# Patient Record
Sex: Female | Born: 1960 | Race: Black or African American | Hispanic: No | Marital: Single | Smoking: Never smoker
Health system: Southern US, Community
[De-identification: ages and names within clinical notes are randomized; demographics above are authoritative.]

## PROBLEM LIST (undated history)

## (undated) DIAGNOSIS — Z8669 Personal history of other diseases of the nervous system and sense organs: Secondary | ICD-10-CM

## (undated) DIAGNOSIS — T7840XA Allergy, unspecified, initial encounter: Secondary | ICD-10-CM

## (undated) DIAGNOSIS — E669 Obesity, unspecified: Secondary | ICD-10-CM

## (undated) DIAGNOSIS — E785 Hyperlipidemia, unspecified: Secondary | ICD-10-CM

## (undated) DIAGNOSIS — M2550 Pain in unspecified joint: Secondary | ICD-10-CM

## (undated) DIAGNOSIS — I10 Essential (primary) hypertension: Secondary | ICD-10-CM

## (undated) DIAGNOSIS — E7800 Pure hypercholesterolemia, unspecified: Secondary | ICD-10-CM

## (undated) DIAGNOSIS — I2510 Atherosclerotic heart disease of native coronary artery without angina pectoris: Secondary | ICD-10-CM

## (undated) HISTORY — DX: Obesity, unspecified: E66.9

## (undated) HISTORY — DX: Atherosclerotic heart disease of native coronary artery without angina pectoris: I25.10

## (undated) HISTORY — DX: Pure hypercholesterolemia, unspecified: E78.00

## (undated) HISTORY — DX: Hyperlipidemia, unspecified: E78.5

## (undated) HISTORY — PX: TEAR DUCT PROBING: SHX793

## (undated) HISTORY — DX: Pain in unspecified joint: M25.50

## (undated) HISTORY — DX: Personal history of other diseases of the nervous system and sense organs: Z86.69

## (undated) HISTORY — DX: Allergy, unspecified, initial encounter: T78.40XA

## (undated) HISTORY — DX: Essential (primary) hypertension: I10

---

## 2001-12-17 ENCOUNTER — Encounter: Admission: RE | Admit: 2001-12-17 | Discharge: 2002-03-17 | Payer: Self-pay | Admitting: Family Medicine

## 2002-04-02 ENCOUNTER — Encounter: Admission: RE | Admit: 2002-04-02 | Discharge: 2002-07-01 | Payer: Self-pay | Admitting: Family Medicine

## 2002-07-07 ENCOUNTER — Encounter: Admission: RE | Admit: 2002-07-07 | Discharge: 2002-10-05 | Payer: Self-pay | Admitting: Family Medicine

## 2002-10-14 ENCOUNTER — Encounter: Admission: RE | Admit: 2002-10-14 | Discharge: 2003-01-12 | Payer: Self-pay | Admitting: Family Medicine

## 2003-01-27 ENCOUNTER — Encounter: Admission: RE | Admit: 2003-01-27 | Discharge: 2003-04-27 | Payer: Self-pay | Admitting: Family Medicine

## 2003-05-11 ENCOUNTER — Encounter: Admission: RE | Admit: 2003-05-11 | Discharge: 2003-08-09 | Payer: Self-pay | Admitting: Family Medicine

## 2003-10-04 ENCOUNTER — Encounter: Admission: RE | Admit: 2003-10-04 | Discharge: 2004-01-02 | Payer: Self-pay | Admitting: Family Medicine

## 2004-02-03 ENCOUNTER — Emergency Department (HOSPITAL_COMMUNITY): Admission: EM | Admit: 2004-02-03 | Discharge: 2004-02-03 | Payer: Self-pay | Admitting: Family Medicine

## 2004-03-28 ENCOUNTER — Encounter: Admission: RE | Admit: 2004-03-28 | Discharge: 2004-03-29 | Payer: Self-pay | Admitting: Family Medicine

## 2005-10-22 ENCOUNTER — Ambulatory Visit: Payer: Self-pay | Admitting: Family Medicine

## 2006-10-24 ENCOUNTER — Ambulatory Visit: Payer: Self-pay | Admitting: Family Medicine

## 2006-12-03 ENCOUNTER — Other Ambulatory Visit: Admission: RE | Admit: 2006-12-03 | Discharge: 2006-12-03 | Payer: Self-pay | Admitting: Family Medicine

## 2006-12-03 ENCOUNTER — Ambulatory Visit: Payer: Self-pay | Admitting: Family Medicine

## 2008-02-09 ENCOUNTER — Ambulatory Visit: Payer: Self-pay | Admitting: Family Medicine

## 2008-02-25 ENCOUNTER — Ambulatory Visit: Payer: Self-pay | Admitting: Family Medicine

## 2008-03-15 ENCOUNTER — Encounter: Admission: RE | Admit: 2008-03-15 | Discharge: 2008-03-30 | Payer: Self-pay | Admitting: Family Medicine

## 2008-03-24 ENCOUNTER — Ambulatory Visit: Payer: Self-pay | Admitting: Family Medicine

## 2008-06-07 ENCOUNTER — Ambulatory Visit: Payer: Self-pay | Admitting: Family Medicine

## 2008-10-04 ENCOUNTER — Ambulatory Visit: Payer: Self-pay | Admitting: Family Medicine

## 2009-02-04 ENCOUNTER — Ambulatory Visit: Payer: Self-pay | Admitting: Family Medicine

## 2009-05-31 ENCOUNTER — Ambulatory Visit: Payer: Self-pay | Admitting: Family Medicine

## 2009-10-20 ENCOUNTER — Ambulatory Visit: Payer: Self-pay | Admitting: Family Medicine

## 2009-12-31 ENCOUNTER — Emergency Department (HOSPITAL_COMMUNITY): Admission: EM | Admit: 2009-12-31 | Discharge: 2009-12-31 | Payer: Self-pay | Admitting: Family Medicine

## 2010-04-27 ENCOUNTER — Ambulatory Visit: Payer: Self-pay | Admitting: Family Medicine

## 2010-08-30 ENCOUNTER — Encounter: Payer: Self-pay | Admitting: Family Medicine

## 2010-09-28 ENCOUNTER — Ambulatory Visit (INDEPENDENT_AMBULATORY_CARE_PROVIDER_SITE_OTHER): Payer: 59 | Admitting: Family Medicine

## 2010-09-28 ENCOUNTER — Encounter: Payer: Self-pay | Admitting: Family Medicine

## 2010-09-28 DIAGNOSIS — E1169 Type 2 diabetes mellitus with other specified complication: Secondary | ICD-10-CM

## 2010-09-28 DIAGNOSIS — E785 Hyperlipidemia, unspecified: Secondary | ICD-10-CM

## 2010-09-28 DIAGNOSIS — I10 Essential (primary) hypertension: Secondary | ICD-10-CM

## 2010-09-28 DIAGNOSIS — E1159 Type 2 diabetes mellitus with other circulatory complications: Secondary | ICD-10-CM | POA: Insufficient documentation

## 2010-09-28 DIAGNOSIS — E119 Type 2 diabetes mellitus without complications: Secondary | ICD-10-CM

## 2010-09-28 DIAGNOSIS — I152 Hypertension secondary to endocrine disorders: Secondary | ICD-10-CM | POA: Insufficient documentation

## 2010-09-28 DIAGNOSIS — J309 Allergic rhinitis, unspecified: Secondary | ICD-10-CM

## 2010-09-28 DIAGNOSIS — E118 Type 2 diabetes mellitus with unspecified complications: Secondary | ICD-10-CM | POA: Insufficient documentation

## 2010-09-28 DIAGNOSIS — Z8249 Family history of ischemic heart disease and other diseases of the circulatory system: Secondary | ICD-10-CM

## 2010-09-28 DIAGNOSIS — Z8669 Personal history of other diseases of the nervous system and sense organs: Secondary | ICD-10-CM

## 2010-09-28 LAB — POCT GLYCOSYLATED HEMOGLOBIN (HGB A1C): Hemoglobin A1C: 6.2

## 2010-09-28 NOTE — Patient Instructions (Signed)
Start back using a food diary. Pay attention to every meal in terms of quality of the meal. fatss protein and carbohydrates.  get at least 20 minutes of exercise every day. You can also spread out throughout the day and take 5 or 10 minutes periods of exercise

## 2010-09-28 NOTE — Progress Notes (Signed)
  Subjective:    Patient ID: Alicia Lowery, female    DOB: Jun 24, 1960, 50 y.o.   MRN: [id number]  HPI she is here for a diabetes recheck. Her weight is up 10 pounds. Per discussion with her indicates she eats what her brother is cooking. Exercise pattern is less than 20 minutes per day. she does check her feet periodically. Continues on medications listed in the chart he does not smoke or drink.   Review of Systems Negative except as above    Objective:   Physical Exam work and in no distress otherwise not examined. Glowed A1c 6.2        Assessment & Plan:  see problem list I again had a long discussion with her concerning her lifestyle habits in regard to diet and exercise. Strongly suggested that she make a conscious effort to evaluate all food she eats as well as increase her physical activities. She'll continue on her present location regimen.

## 2010-11-24 ENCOUNTER — Other Ambulatory Visit: Payer: Self-pay | Admitting: Family Medicine

## 2011-01-29 ENCOUNTER — Ambulatory Visit: Payer: 59 | Admitting: Family Medicine

## 2011-07-10 ENCOUNTER — Other Ambulatory Visit: Payer: Self-pay | Admitting: Family Medicine

## 2011-08-25 ENCOUNTER — Other Ambulatory Visit: Payer: Self-pay | Admitting: Family Medicine

## 2012-01-29 ENCOUNTER — Other Ambulatory Visit: Payer: Self-pay | Admitting: Family Medicine

## 2012-01-30 ENCOUNTER — Other Ambulatory Visit: Payer: Self-pay

## 2012-01-30 MED ORDER — LISINOPRIL-HYDROCHLOROTHIAZIDE 20-12.5 MG PO TABS: 1.0000 | ORAL_TABLET | Freq: Every day | ORAL | Status: DC

## 2012-01-30 NOTE — Telephone Encounter (Signed)
Got request for 90 days due to ins.

## 2012-05-22 ENCOUNTER — Other Ambulatory Visit: Payer: Self-pay | Admitting: Family Medicine

## 2012-05-30 ENCOUNTER — Telehealth: Payer: Self-pay | Admitting: Family Medicine

## 2012-05-30 MED ORDER — LISINOPRIL-HYDROCHLOROTHIAZIDE 20-12.5 MG PO TABS: 1.0000 | ORAL_TABLET | Freq: Every day | ORAL | Status: DC

## 2012-05-30 NOTE — Telephone Encounter (Signed)
SENT B/P MED IN BECAUSE PT HAS APT

## 2012-06-06 ENCOUNTER — Ambulatory Visit: Payer: 59 | Admitting: Family Medicine

## 2012-06-10 ENCOUNTER — Encounter: Payer: Self-pay | Admitting: Gastroenterology

## 2012-06-10 ENCOUNTER — Encounter: Payer: Self-pay | Admitting: Family Medicine

## 2012-06-10 ENCOUNTER — Ambulatory Visit (INDEPENDENT_AMBULATORY_CARE_PROVIDER_SITE_OTHER): Payer: 59 | Admitting: Family Medicine

## 2012-06-10 VITALS — BP 130/80 | HR 80 | Ht 62.5 in | Wt 249.0 lb

## 2012-06-10 DIAGNOSIS — E1159 Type 2 diabetes mellitus with other circulatory complications: Secondary | ICD-10-CM

## 2012-06-10 DIAGNOSIS — Z79899 Other long term (current) drug therapy: Secondary | ICD-10-CM

## 2012-06-10 DIAGNOSIS — E119 Type 2 diabetes mellitus without complications: Secondary | ICD-10-CM

## 2012-06-10 DIAGNOSIS — E1169 Type 2 diabetes mellitus with other specified complication: Secondary | ICD-10-CM

## 2012-06-10 DIAGNOSIS — Z8249 Family history of ischemic heart disease and other diseases of the circulatory system: Secondary | ICD-10-CM

## 2012-06-10 DIAGNOSIS — E785 Hyperlipidemia, unspecified: Secondary | ICD-10-CM

## 2012-06-10 DIAGNOSIS — Z8669 Personal history of other diseases of the nervous system and sense organs: Secondary | ICD-10-CM

## 2012-06-10 DIAGNOSIS — J309 Allergic rhinitis, unspecified: Secondary | ICD-10-CM

## 2012-06-10 DIAGNOSIS — I152 Hypertension secondary to endocrine disorders: Secondary | ICD-10-CM

## 2012-06-10 DIAGNOSIS — I10 Essential (primary) hypertension: Secondary | ICD-10-CM

## 2012-06-10 LAB — CBC WITH DIFFERENTIAL/PLATELET
Basophils Absolute: 0 10*3/uL (ref 0.0–0.1)
Basophils Relative: 1 % (ref 0–1)
Eosinophils Absolute: 0.2 10*3/uL (ref 0.0–0.7)
Eosinophils Relative: 5 % (ref 0–5)
Lymphocytes Relative: 46 % (ref 12–46)
MCV: 88.3 fL (ref 78.0–100.0)
Platelets: 274 10*3/uL (ref 150–400)
RDW: 14.1 % (ref 11.5–15.5)
WBC: 4.4 10*3/uL (ref 4.0–10.5)

## 2012-06-10 LAB — POCT URINALYSIS DIPSTICK
Bilirubin, UA: NEGATIVE
Blood, UA: NEGATIVE
Nitrite, UA: NEGATIVE
Urobilinogen, UA: NEGATIVE
pH, UA: 7

## 2012-06-10 LAB — POCT GLYCOSYLATED HEMOGLOBIN (HGB A1C): Hemoglobin A1C: 6.3

## 2012-06-10 LAB — POCT UA - MICROALBUMIN: Albumin/Creatinine Ratio, Urine, POC: <3.3

## 2012-06-10 MED ORDER — LISINOPRIL-HYDROCHLOROTHIAZIDE 20-12.5 MG PO TABS: 1.0000 | ORAL_TABLET | Freq: Every day | ORAL | Status: DC

## 2012-06-10 NOTE — Progress Notes (Signed)
  Subjective:    Patient ID: Alicia Lowery, female    DOB: May 05, 1961, 52 y.o.   MRN: [id number]  HPI She is here for an interval evaluation. She has not been seen in well over a year. She was surprised that had been that long. Presently she is taking blood pressure medication and does occasionally check her blood sugar. She has not seen a doctor in the last year. Her activity level is quite limited. Her allergies seem to be under good control. She has very little difficulty with migraine headaches. In the past he had been on medicine for her cholesterol but did have difficulties with them. She does have a family history of heart disease but has not seen cardiology. Her last EKG was in 2004. She has had no chest pain, shortness of breath, DOE. She has not had a Pap in several years.   Review of Systems     Objective:   Physical Exam Alert and in no distress. Hemoglobin A1c is 5.7 EKG does show evidence of right bundle branch block.     Assessment & Plan:   1. Diabetes mellitus  POCT glycosylated hemoglobin (Hb A1C), POCT Urinalysis Dipstick, POCT UA - Microalbumin, Lipid panel, CBC with Differential, Comprehensive metabolic panel, Ambulatory referral to Ophthalmology  2. Family history of heart disease in female family member before age 4  EKG 12-Lead, Ambulatory referral to Cardiology  3. History of migraine headaches    4. Hyperlipidemia LDL goal <70  Lipid panel  5. Allergic rhinitis    6. Hypertension associated with diabetes  CBC with Differential, Comprehensive metabolic panel, lisinopril-hydrochlorothiazide (PRINZIDE,ZESTORETIC) 20-12.5 MG per tablet  7. Obesity, morbid  CBC with Differential, Comprehensive metabolic panel, lisinopril-hydrochlorothiazide (PRINZIDE,ZESTORETIC) 20-12.5 MG per tablet  8. Encounter for long-term (current) use of other medications  HM COLONOSCOPY, Flu vaccine greater than or equal to 3yo preservative free IM   recommend she return for complete examination  and Pap. Routine blood studies ordered. I will place her back on all of her medications, flu shot, colonoscopy will be ordered.

## 2012-06-11 LAB — COMPREHENSIVE METABOLIC PANEL
ALT: 19 U/L (ref 0–35)
AST: 17 U/L (ref 0–37)
Alkaline Phosphatase: 53 U/L (ref 39–117)
Chloride: 103 mEq/L (ref 96–112)
Creat: 0.8 mg/dL (ref 0.50–1.10)
Total Bilirubin: 0.8 mg/dL (ref 0.3–1.2)

## 2012-06-11 LAB — LIPID PANEL
HDL: 46 mg/dL (ref >39)
Total CHOL/HDL Ratio: 5.6 Ratio

## 2012-06-11 MED ORDER — ROSUVASTATIN CALCIUM 20 MG PO TABS: 20.0000 mg | ORAL_TABLET | Freq: Every day | ORAL | Status: DC

## 2012-06-12 DIAGNOSIS — Z79899 Other long term (current) drug therapy: Secondary | ICD-10-CM

## 2012-06-12 DIAGNOSIS — Z23 Encounter for immunization: Secondary | ICD-10-CM

## 2012-06-13 NOTE — Progress Notes (Signed)
Quick Note:  CALLED PT # LEFT WORD FOR WORD MESSAGE( Let her know that I called in Crestor. Recheck in 4 months   ______

## 2012-07-10 ENCOUNTER — Ambulatory Visit (AMBULATORY_SURGERY_CENTER): Payer: 59

## 2012-07-10 ENCOUNTER — Encounter: Payer: Self-pay | Admitting: Gastroenterology

## 2012-07-10 VITALS — Ht 62.0 in | Wt 257.0 lb

## 2012-07-10 DIAGNOSIS — Z1211 Encounter for screening for malignant neoplasm of colon: Secondary | ICD-10-CM

## 2012-07-24 ENCOUNTER — Ambulatory Visit (AMBULATORY_SURGERY_CENTER): Payer: 59 | Admitting: Gastroenterology

## 2012-07-24 ENCOUNTER — Encounter: Payer: Self-pay | Admitting: Gastroenterology

## 2012-07-24 VITALS — BP 122/86 | HR 62 | Temp 97.1°F | Resp 21 | Ht 62.0 in | Wt 257.0 lb

## 2012-07-24 DIAGNOSIS — Z1211 Encounter for screening for malignant neoplasm of colon: Secondary | ICD-10-CM

## 2012-07-24 MED ORDER — SODIUM CHLORIDE 0.9 % IV SOLN: 500.0000 mL | INTRAVENOUS | Status: DC

## 2012-07-24 NOTE — Progress Notes (Signed)
Patient did not experience any of the following events: a burn prior to discharge; a fall within the facility; wrong site/side/patient/procedure/implant event; or a hospital transfer or hospital admission upon discharge from the facility. (G8907) Patient did not have preoperative order for IV antibiotic SSI prophylaxis. (G8918)  

## 2012-07-24 NOTE — Op Note (Signed)
[facility] [address] [place], [address]   COLONOSCOPY PROCEDURE REPORT  PATIENT: Alicia, Lowery  MR#: [id number] BIRTHDATE: 28-Jul-1960 , 52  yrs. old GENDER: Female ENDOSCOPIST: Louis Meckel, MD REFERRED XB:JYNW Susann Givens, M.D. PROCEDURE DATE:  07/24/2012 PROCEDURE:   Colonoscopy, diagnostic ASA CLASS:   Class II INDICATIONS:average risk screening. MEDICATIONS: MAC sedation, administered by CRNA and Propofol (Diprivan) 230 mg IV  DESCRIPTION OF PROCEDURE:   After the risks benefits and alternatives of the procedure were thoroughly explained, informed consent was obtained.  A digital rectal exam revealed no abnormalities of the rectum.   The LB [id number]  endoscope was introduced through the anus and advanced to the cecum, which was identified by both the appendix and ileocecal valve. No adverse events experienced.   The quality of the prep was Suprep good  The instrument was then slowly withdrawn as the colon was fully examined.      COLON FINDINGS: A normal appearing cecum, ileocecal valve, and appendiceal orifice were identified.  The ascending, hepatic flexure, transverse, splenic flexure, descending, sigmoid colon and rectum appeared unremarkable.  No polyps or cancers were seen. Retroflexed views revealed internal hemorrhoids and an anal tag.. The time to cecum=2 minutes 49 seconds.  Withdrawal time=8 minutes 02 seconds.  The scope was withdrawn and the procedure completed. COMPLICATIONS: There were no complications.  ENDOSCOPIC IMPRESSION: hemorrhoids  RECOMMENDATIONS: Continue current colorectal screening recommendations for "routine risk" patients with a repeat colonoscopy in 10 years.   eSigned:  Louis Meckel, MD 07/24/2012 11:11 AM   cc:

## 2012-07-24 NOTE — Progress Notes (Signed)
Report to pacu rn, vss, bbs=clear 

## 2012-07-24 NOTE — Patient Instructions (Addendum)

## 2012-07-25 ENCOUNTER — Telehealth: Payer: Self-pay | Admitting: *Deleted

## 2012-07-25 NOTE — Telephone Encounter (Signed)
  Follow up Call-  Call back number 07/24/2012  Post procedure Call Back phone  # [phone]  Permission to leave phone message Yes     Patient questions:  Do you have a fever, pain , or abdominal swelling? no Pain Score  0 *  Have you tolerated food without any problems? yes  Have you been able to return to your normal activities? yes  Do you have any questions about your discharge instructions: Diet   no Medications  no Follow up visit  no  Do you have questions or concerns about your Care? no  Actions: * If pain score is 4 or above: No action needed, pain <4.

## 2012-09-18 ENCOUNTER — Telehealth: Payer: Self-pay | Admitting: Internal Medicine

## 2012-09-18 NOTE — Telephone Encounter (Signed)
Pt is scheduled with [facility] 09/19/12 @ 1.30pm arrive at 1:15pm with Dr. Rollene Rotunda. address is [address]  Phone # [phone]

## 2012-09-19 ENCOUNTER — Institutional Professional Consult (permissible substitution): Payer: 59 | Admitting: Cardiology

## 2012-09-26 ENCOUNTER — Encounter: Payer: Self-pay | Admitting: Internal Medicine

## 2012-10-14 ENCOUNTER — Ambulatory Visit (INDEPENDENT_AMBULATORY_CARE_PROVIDER_SITE_OTHER): Payer: 59 | Admitting: Family Medicine

## 2012-10-14 ENCOUNTER — Encounter: Payer: Self-pay | Admitting: Family Medicine

## 2012-10-14 VITALS — BP 130/80 | HR 81 | Wt 256.0 lb

## 2012-10-14 DIAGNOSIS — I10 Essential (primary) hypertension: Secondary | ICD-10-CM

## 2012-10-14 DIAGNOSIS — E1169 Type 2 diabetes mellitus with other specified complication: Secondary | ICD-10-CM

## 2012-10-14 DIAGNOSIS — E119 Type 2 diabetes mellitus without complications: Secondary | ICD-10-CM

## 2012-10-14 DIAGNOSIS — Z91199 Patient's noncompliance with other medical treatment and regimen due to unspecified reason: Secondary | ICD-10-CM

## 2012-10-14 DIAGNOSIS — E785 Hyperlipidemia, unspecified: Secondary | ICD-10-CM

## 2012-10-14 DIAGNOSIS — I152 Hypertension secondary to endocrine disorders: Secondary | ICD-10-CM

## 2012-10-14 DIAGNOSIS — E1159 Type 2 diabetes mellitus with other circulatory complications: Secondary | ICD-10-CM

## 2012-10-14 DIAGNOSIS — Z8249 Family history of ischemic heart disease and other diseases of the circulatory system: Secondary | ICD-10-CM

## 2012-10-14 DIAGNOSIS — Z79899 Other long term (current) drug therapy: Secondary | ICD-10-CM

## 2012-10-14 DIAGNOSIS — Z9119 Patient's noncompliance with other medical treatment and regimen: Secondary | ICD-10-CM

## 2012-10-14 LAB — LIPID PANEL
Total CHOL/HDL Ratio: 3.4 Ratio
VLDL: 23 mg/dL (ref 0–40)

## 2012-10-14 LAB — COMPREHENSIVE METABOLIC PANEL
ALT: 22 U/L (ref 0–35)
AST: 20 U/L (ref 0–37)
Chloride: 104 mEq/L (ref 96–112)
Creat: 0.97 mg/dL (ref 0.50–1.10)
Total Bilirubin: 0.8 mg/dL (ref 0.3–1.2)

## 2012-10-14 NOTE — Progress Notes (Signed)
  Subjective:    Patient ID: Alicia Lowery, female    DOB: 03-Jan-1961, 52 y.o.   MRN: [id number]  HPI She is here for a diabetes recheck. She continues on medications listed in the chart. She admits to not exercising regularly and has made very few dietary changes. Social and family history were reviewed. She is scheduled to see cardiology for cardiac evaluation due to her family history. She does need lance sets.   Review of Systems     Objective:   Physical Exam Alert and in no distress. Globin A1c is 6.5.       Assessment & Plan:  Diabetes mellitus - Plan: POCT glycosylated hemoglobin (Hb A1C)  Obesity, morbid  Hypertension associated with diabetes  Hyperlipidemia LDL goal <70 - Plan: Lipid panel  Encounter for long-term (current) use of other medications - Plan: Comprehensive metabolic panel, Lipid panel  Personal history of noncompliance with medical treatment, presenting hazards to health  Family history of heart disease in female family member before age 61 Spent over 20 minutes spent discussing her care. Strongly encouraged her to continue to make diet and exercise changes. Discussed the fact that her present lifestyle is slowly killing her.

## 2012-10-15 NOTE — Progress Notes (Signed)
Quick Note:  Called pt to inform her that lipids look great continue present med regimen ______

## 2012-11-03 ENCOUNTER — Other Ambulatory Visit: Payer: Self-pay

## 2012-11-03 MED ORDER — ONETOUCH ULTRASOFT LANCETS MISC: Status: DC

## 2012-11-03 MED ORDER — GLUCOSE BLOOD VI STRP: ORAL_STRIP | Status: DC

## 2012-11-03 NOTE — Telephone Encounter (Signed)
SENT 90 DAYS OF TESTING SUPPLIES IN

## 2012-11-25 ENCOUNTER — Ambulatory Visit (INDEPENDENT_AMBULATORY_CARE_PROVIDER_SITE_OTHER): Payer: 59 | Admitting: Cardiology

## 2012-11-25 ENCOUNTER — Encounter: Payer: Self-pay | Admitting: Cardiology

## 2012-11-25 VITALS — BP 122/89 | HR 72 | Ht 63.0 in | Wt 253.1 lb

## 2012-11-25 DIAGNOSIS — Z8249 Family history of ischemic heart disease and other diseases of the circulatory system: Secondary | ICD-10-CM

## 2012-11-25 DIAGNOSIS — R0600 Dyspnea, unspecified: Secondary | ICD-10-CM

## 2012-11-25 DIAGNOSIS — R9431 Abnormal electrocardiogram [ECG] [EKG]: Secondary | ICD-10-CM

## 2012-11-25 NOTE — Progress Notes (Signed)
HPI The patient presents for evaluation of risk factors as well as a family history of early coronary artery disease. She herself has had no prior cardiac history. She has never had any cardiovascular testing. She doesn't recall being told she has an abnormal EKG. She doesn't usually have any symptoms though occasionally when she has to walk up 5 flights because the elevator is she will get very dyspneic. With the usual activities such as walking 75 yards of her driveway she doesn't have any cardiovascular complaints.  The patient denies any new symptoms such as chest discomfort, neck or arm discomfort. There has been no new shortness of breath, PND or orthopnea. There have been no reported palpitations, presyncope or syncope.  No Known Allergies  Current Outpatient Prescriptions  Medication Sig Dispense Refill  . aspirin 81 MG tablet Take 81 mg by mouth daily.        Marland Kitchen glucose blood (ONE TOUCH ULTRA TEST) test strip TEST DAILY AS DIRECTED  300 each  3  . Lancets (ONETOUCH ULTRASOFT) lancets USE TO TEST DAILY  300 each  3  . lisinopril-hydrochlorothiazide (PRINZIDE,ZESTORETIC) 20-12.5 MG per tablet Take 1 tablet by mouth daily.  90 tablet  0  . rosuvastatin (CRESTOR) 20 MG tablet Take 1 tablet (20 mg total) by mouth daily.  90 tablet  3   No current facility-administered medications for this visit.    Past Medical History  Diagnosis Date  . Obesity   . Hypertension   . Allergy   . Cardiovascular disease     Family History  . Hemorrhoids   . Dyslipidemia   . History of migraine headaches   . Diabetes mellitus     Past Surgical History  Procedure Laterality Date  . Tear duct probing      Reuel Boom    Family History  Problem Relation Age of Onset  . Arthritis Mother   . Hypertension Mother   . Heart disease Father   . Hypertension Father   . Arthritis Sister   . Hypertension Sister   . Arthritis Brother   . COPD Maternal Grandmother   . Arthritis Maternal  Grandmother   . Colon cancer Neg Hx     History   Social History  . Marital Status: Single    Spouse Name: N/A    Number of Children: N/A  . Years of Education: N/A   Occupational History  . Not on file.   Social History Main Topics  . Smoking status: Never Smoker   . Smokeless tobacco: Never Used  . Alcohol Use: No  . Drug Use: No  . Sexually Active: Not Currently   Other Topics Concern  . Not on file   Social History Narrative  . No narrative on file    ROS:  As stated in the HPI and negative for all other systems.  PHYSICAL EXAM BP 122/89  Pulse 72  Ht 5\' 3"  (1.6 m)  Wt 253 lb 1.9 oz (114.814 kg)  BMI 44.85 kg/m2 GENERAL:  Well appearing HEENT:  Pupils equal round and reactive, fundi not visualized, oral mucosa unremarkable NECK:  No jugular venous distention, waveform within normal limits, carotid upstroke brisk and symmetric, no bruits, no thyromegaly LYMPHATICS:  No cervical, inguinal adenopathy LUNGS:  Clear to auscultation bilaterally BACK:  No CVA tenderness CHEST:  Unremarkable HEART:  PMI not displaced or sustained,S1 and S2 within normal limits, no S3, no S4, no clicks, no rubs, slight apical systolic murmur heard best  at the right upper sternal border, no diastolic murmurs ABD:  Flat, positive bowel sounds normal in frequency in pitch, no bruits, no rebound, no guarding, no midline pulsatile mass, no hepatomegaly, no splenomegaly EXT:  2 plus pulses throughout, no edema, no cyanosis no clubbing SKIN:  No rashes no nodules NEURO:  Cranial nerves II through XII grossly intact, motor grossly intact throughout [facility]:  Cognitively intact, oriented to person place and time  EKG:  Sinus rhythm, rate 72, right bundle branch block, left anterior fascicular block 11/25/2012  ASSESSMENT AND PLAN  ABNORMAL EKG:  We discussed a conduction defect that she has. This may be related to hypertension. I'll defer to Carollee Herter, MD Whether he would want to  screen for sarcoid as this is an unlikely but possible etiology any young woman. He can consider getting a chest x-ray. I will assess with an echocardiogram. If this is normal the other workup will be as below and we can follow serial EKGs.  DYSPNEA:  Given this and her risk factors I will bring her back for a POET (Plain Old Exercise Test). This will allow me to screen for obstructive coronary disease, risk stratify and very importantly provide a prescription for exercise.  I do think the EKG will be interpretable despite the baseline abnormality. We sent a long time discussing diet and exercise.  OBESITY:  The patient understands the need to lose weight with diet and exercise. We have discussed specific strategies for this.

## 2012-11-25 NOTE — Patient Instructions (Addendum)
The current medical regimen is effective;  continue present plan and medications.  Your physician has requested that you have an exercise tolerance test. For further information please visit [email or web address] Please also follow instruction sheet, as given.  Your physician has requested that you have an echocardiogram. Echocardiography is a painless test that uses sound waves to create images of your heart. It provides your doctor with information about the size and shape of your heart and how well your heart's chambers and valves are working. This procedure takes approximately one hour. There are no restrictions for this procedure.

## 2012-12-02 ENCOUNTER — Other Ambulatory Visit: Payer: Self-pay | Admitting: Family Medicine

## 2012-12-12 ENCOUNTER — Other Ambulatory Visit (HOSPITAL_COMMUNITY): Payer: 59

## 2012-12-12 ENCOUNTER — Encounter: Payer: Self-pay | Admitting: Nurse Practitioner

## 2012-12-12 ENCOUNTER — Ambulatory Visit (INDEPENDENT_AMBULATORY_CARE_PROVIDER_SITE_OTHER): Payer: 59 | Admitting: Nurse Practitioner

## 2012-12-12 ENCOUNTER — Ambulatory Visit (HOSPITAL_COMMUNITY): Payer: 59 | Attending: Cardiology | Admitting: Radiology

## 2012-12-12 DIAGNOSIS — R9431 Abnormal electrocardiogram [ECG] [EKG]: Secondary | ICD-10-CM

## 2012-12-12 DIAGNOSIS — I10 Essential (primary) hypertension: Secondary | ICD-10-CM | POA: Insufficient documentation

## 2012-12-12 DIAGNOSIS — R0600 Dyspnea, unspecified: Secondary | ICD-10-CM

## 2012-12-12 DIAGNOSIS — R0602 Shortness of breath: Secondary | ICD-10-CM | POA: Insufficient documentation

## 2012-12-12 DIAGNOSIS — R011 Cardiac murmur, unspecified: Secondary | ICD-10-CM | POA: Insufficient documentation

## 2012-12-12 DIAGNOSIS — Z8249 Family history of ischemic heart disease and other diseases of the circulatory system: Secondary | ICD-10-CM

## 2012-12-12 DIAGNOSIS — I4510 Unspecified right bundle-branch block: Secondary | ICD-10-CM | POA: Insufficient documentation

## 2012-12-12 DIAGNOSIS — E119 Type 2 diabetes mellitus without complications: Secondary | ICD-10-CM | POA: Insufficient documentation

## 2012-12-12 DIAGNOSIS — I452 Bifascicular block: Secondary | ICD-10-CM

## 2012-12-12 DIAGNOSIS — E785 Hyperlipidemia, unspecified: Secondary | ICD-10-CM | POA: Insufficient documentation

## 2012-12-12 NOTE — Procedures (Signed)
Exercise Treadmill Test  Pre-Exercise Testing Evaluation Rhythm: normal sinus  Rate: 73     Test  Exercise Tolerance Test Ordering MD: Angelina Sheriff, MD  Interpreting MD: Norma Fredrickson NP  Unique Test No: 1  Treadmill:  1  Indication for ETT: dyspnea  Contraindication to ETT: No   Stress Modality: exercise - treadmill  Cardiac Imaging Performed: non   Protocol: standard Bruce - maximal  Max BP:  131/43  Max MPHR (bpm):  167 85% MPR (bpm):  142  MPHR obtained (bpm):  146 % MPHR obtained:  87%  Reached 85% MPHR (min:sec):  3 min Total Exercise Time (min-sec):  3 minutes  Workload in METS:  4.6 Borg Scale: 17  Reason ETT Terminated:  patient's desire to stop    ST Segment Analysis At Rest: normal ST segments - no evidence of significant ST depression With Exercise: no evidence of significant ST depression  Other Information Arrhythmia:  No Angina during ETT:  absent (0) Quality of ETT:  non-diagnostic  ETT Interpretation:  normal - no evidence of ischemia by ST analysis  Comments: Patient presents today for routine GXT. Has abnormal EKG. Has had shortness of breath. Morbidly obese. Denies chest pain. Has DM, HTN, and HLD.   Today, she exercised on the standard Bruce protocol for a total of 3 minutes. VERY POOR exercise tolerance. Adequate blood pressure response. Clinically with dyspnea and the test was stopped due to fatigue. No chest pain. EKG with T wave changes in AVL. Resting EKG with RBBB, LAFB.   Recommendations: Myoview to further define.  She has very poor exercise tolerance. This test was not felt to be adequate. T wave changes noted with exercise.   Echo results from earlier today are pending.  Patient is agreeable to this plan and will call if any problems develop in the interim.   Rosalio Macadamia, RN, ANP-C [facility] [address] [place], [place]  [address]

## 2012-12-12 NOTE — Progress Notes (Signed)
Echocardiogram performed.  

## 2012-12-23 ENCOUNTER — Ambulatory Visit (HOSPITAL_COMMUNITY): Payer: 59 | Attending: Cardiology | Admitting: Radiology

## 2012-12-23 VITALS — BP 123/64 | Ht 63.0 in | Wt 251.0 lb

## 2012-12-23 DIAGNOSIS — Z8249 Family history of ischemic heart disease and other diseases of the circulatory system: Secondary | ICD-10-CM

## 2012-12-23 DIAGNOSIS — R0600 Dyspnea, unspecified: Secondary | ICD-10-CM

## 2012-12-23 DIAGNOSIS — R9431 Abnormal electrocardiogram [ECG] [EKG]: Secondary | ICD-10-CM | POA: Insufficient documentation

## 2012-12-23 DIAGNOSIS — R0609 Other forms of dyspnea: Secondary | ICD-10-CM | POA: Insufficient documentation

## 2012-12-23 DIAGNOSIS — R0989 Other specified symptoms and signs involving the circulatory and respiratory systems: Secondary | ICD-10-CM | POA: Insufficient documentation

## 2012-12-23 MED ORDER — TECHNETIUM TC 99M SESTAMIBI GENERIC - CARDIOLITE
33.0000 | Freq: Once | INTRAVENOUS | Status: AC | PRN
  Administered 2012-12-23: 33 via INTRAVENOUS

## 2012-12-23 MED ORDER — REGADENOSON 0.4 MG/5ML IV SOLN
0.4000 mg | Freq: Once | INTRAVENOUS | Status: AC
  Administered 2012-12-23: 0.4 mg via INTRAVENOUS

## 2012-12-23 NOTE — Progress Notes (Signed)
[facility] SITE 3 NUCLEAR MED [address] [place], [place] [address] [phone]    Cardiology Nuclear Med Study  Alicia Lowery is a 52 y.o. female     MRN : [id number]     DOB: 03-10-1961  Procedure Date: 12/23/2012  Nuclear Med Background Indication for Stress Test:  Evaluation for Ischemia and Abnormal EKG History:  12/12/12 GXT: very poor exercise tolerence-ECHO: EF: 55-60%  mild MR Cardiac Risk Factors: Family History - CAD, Hypertension, Lipids, NIDDM, Obesity and RBBB  Symptoms:  DOE and SOB   Nuclear Pre-Procedure Caffeine/Decaff Intake:  None > 12 hrs NPO After: 7:30pm   Lungs:  clear O2 Sat: 99% on room air. IV 0.9% NS with Angio Cath:  22g  IV Site: R Antecubital x 1, tolerated well IV Started by:  Irean Hong, RN  Chest Size (in):  44 Cup Size: C  Height: 5\' 3"  (1.6 m)  Weight:  251 lb (113.853 kg)  BMI:  Body mass index is 44.47 kg/(m^2). Tech Comments:  Took prinzide this am    Nuclear Med Study 1 or 2 day study: 2 day  Stress Test Type:  Treadmill/Lexiscan  Reading MD: Cassell Clement, MD  Order Authorizing Provider:  Rollene Rotunda, MD, and Norma Fredrickson, NP  Resting Radionuclide: Technetium 52m Sestamibi  Resting Radionuclide Dose: 33.0 mCi  12/25/12  Stress Radionuclide:  Technetium 35m Sestamibi  Stress Radionuclide Dose: 33.0 mCi   12/23/12          Stress Protocol Rest HR: 60 Stress HR: 109  Rest BP: 123/64 Stress BP: 132/73  Exercise Time (min): n/a METS: n/a   Predicted Max HR: 167 bpm % Max HR: 65.27 bpm Rate Pressure Product: 46962   Dose of Adenosine (mg):  n/a Dose of Lexiscan: 0.4 mg  Dose of Atropine (mg): n/a Dose of Dobutamine: n/a mcg/kg/min (at max HR)  Stress Test Technologist: Milana Na, EMT-P  Nuclear Technologist:  Doyne Keel, CNMT     Rest Procedure:  Myocardial perfusion imaging was performed at rest 45 minutes following the intravenous administration of Technetium 47m Sestamibi. Rest ECG:  NSR-LVH  Stress Procedure:  The patient received IV Lexiscan 0.4 mg over 15-seconds with concurrent low level exercise and then Technetium 45m Sestamibi was injected at 30-seconds while the patient continued walking one more minute. This patient had sob, woozy, and abdominal pain with the Lexiscan injection. Quantitative spect images were obtained after a 45-minute delay. Stress ECG: No significant change from baseline ECG  QPS Raw Data Images:  Normal; no motion artifact; normal heart/lung ratio. Stress Images:  Normal homogeneous uptake in all areas of the myocardium. Rest Images:  Normal homogeneous uptake in all areas of the myocardium. Subtraction (SDS):  No evidence of ischemia. Transient Ischemic Dilatation (Normal <1.22):  n/a Lung/Heart Ratio (Normal <0.45): 0.33  Quantitative Gated Spect Images QGS EDV:  93 ml QGS ESV:  33 ml  Impression Exercise Capacity:  Lexiscan with no exercise. BP Response:  Normal blood pressure response. Clinical Symptoms:  No significant symptoms noted. ECG Impression:  No significant ST segment change suggestive of ischemia. Comparison with Prior Nuclear Study: No images to compare  Overall Impression:  Normal stress nuclear study.  LV Ejection Fraction: 64%.  LV Wall Motion:  NL LV Function; NL Wall Motion.  Vesta Mixer, Montez Hageman., MD, [facility] 12/25/2012, 6:15 PM Office - [phone] Pager [phone]

## 2012-12-25 ENCOUNTER — Ambulatory Visit (HOSPITAL_COMMUNITY): Payer: 59 | Attending: Cardiovascular Disease

## 2012-12-25 DIAGNOSIS — R0989 Other specified symptoms and signs involving the circulatory and respiratory systems: Secondary | ICD-10-CM

## 2012-12-25 MED ORDER — TECHNETIUM TC 99M SESTAMIBI GENERIC - CARDIOLITE
33.0000 | Freq: Once | INTRAVENOUS | Status: AC | PRN
  Administered 2012-12-25: 33 via INTRAVENOUS

## 2013-03-07 ENCOUNTER — Other Ambulatory Visit: Payer: Self-pay | Admitting: Family Medicine

## 2013-03-10 ENCOUNTER — Ambulatory Visit: Payer: 59 | Admitting: Family Medicine

## 2013-03-10 ENCOUNTER — Other Ambulatory Visit (INDEPENDENT_AMBULATORY_CARE_PROVIDER_SITE_OTHER): Payer: 59

## 2013-03-10 DIAGNOSIS — Z23 Encounter for immunization: Secondary | ICD-10-CM

## 2013-04-14 ENCOUNTER — Ambulatory Visit (INDEPENDENT_AMBULATORY_CARE_PROVIDER_SITE_OTHER): Payer: 59 | Admitting: Family Medicine

## 2013-04-14 ENCOUNTER — Encounter: Payer: Self-pay | Admitting: Family Medicine

## 2013-04-14 VITALS — BP 130/80 | HR 70 | Wt 244.0 lb

## 2013-04-14 DIAGNOSIS — E1169 Type 2 diabetes mellitus with other specified complication: Secondary | ICD-10-CM

## 2013-04-14 DIAGNOSIS — Z23 Encounter for immunization: Secondary | ICD-10-CM

## 2013-04-14 DIAGNOSIS — E119 Type 2 diabetes mellitus without complications: Secondary | ICD-10-CM

## 2013-04-14 DIAGNOSIS — I10 Essential (primary) hypertension: Secondary | ICD-10-CM

## 2013-04-14 DIAGNOSIS — J309 Allergic rhinitis, unspecified: Secondary | ICD-10-CM

## 2013-04-14 DIAGNOSIS — I452 Bifascicular block: Secondary | ICD-10-CM

## 2013-04-14 DIAGNOSIS — Z8669 Personal history of other diseases of the nervous system and sense organs: Secondary | ICD-10-CM

## 2013-04-14 DIAGNOSIS — E785 Hyperlipidemia, unspecified: Secondary | ICD-10-CM

## 2013-04-14 DIAGNOSIS — I152 Hypertension secondary to endocrine disorders: Secondary | ICD-10-CM

## 2013-04-14 DIAGNOSIS — E1159 Type 2 diabetes mellitus with other circulatory complications: Secondary | ICD-10-CM

## 2013-04-14 DIAGNOSIS — Z8249 Family history of ischemic heart disease and other diseases of the circulatory system: Secondary | ICD-10-CM

## 2013-04-14 LAB — POCT UA - MICROALBUMIN: Creatinine, POC: 177 mg/dL

## 2013-04-14 NOTE — Progress Notes (Signed)
Subjective:    Alicia Lowery is a 52 y.o. female who presents for follow-up of Type 2 diabetes mellitus. She has no particular concerns today.     Home blood sugar records: fasting range: 85-120  measured every other day  Current symptoms/problems include none and have  been unchanged. Daily foot checks: checking feet everyday   Any foot concerns: none today Last eye exam: last year   Medication compliance: Current diet: well balanced Current exercise: walking about 3 x per week Known diabetic complications: none Cardiovascular risk factors: dyslipidemia, hypertension and obesity (BMI >= 30 kg/m2), recently evaluated by cardiology 11/25/12   The following portions of the patient's history were reviewed and updated as appropriate: allergies, current medications, past family history, past medical history, past social history, past surgical history and problem list.  She has a history of hypertension and her blood pressures have typically been approximately 130/80. Her allergic rhinitis has bothered her minimally and she is able to control her congestion and rhinorrhea with over the counter medication such as Loratidine. She has intentionally  lost 12 pounds since her last visit here. In the past she has had migraines but lately has had no headaches. She has a history of RBBB and LAFB, and recently underwent evaluation by cardiology 11/26/10 for DOE, ultimately having normal nuclear stress test. She reports no chest pain, SOB, DOE, palpitations, or syncope.  ROS as in subjective above    Objective:    BP 130/80  Pulse 70  Wt 244 lb (110.678 kg)  SpO2 98%  Filed Vitals:   04/14/13 0817  BP: 130/80  Pulse: 70    General appearence: alert, no distress, WD/WN Neck: supple, no lymphadenopathy, no thyromegaly, no masses Heart: RRR, normal S1, S2, no murmurs Lungs: CTA bilaterally, no wheezes, rhonchi, or rales Abdomen: +bs, soft, non tender, non distended, no masses, no hepatomegaly,  no splenomegaly Pulses: 2+ symmetric, upper and lower extremities, normal cap refill Ext: 2+ pitting edema bilaterally to ankles Foot exam: no wounds, ulcers, or calluses Neuro: foot [place] touch test normal bilaterally   Lab Review Lab Results  Component Value Date   HGBA1C 6.5 10/14/2012   Lab Results  Component Value Date   CHOL 148 10/14/2012   HDL 44 10/14/2012   LDLCALC 81 10/14/2012   TRIG 116 10/14/2012   CHOLHDL 3.4 10/14/2012   No results found for this basenameConcepcion Elk     Chemistry      Component Value Date/Time   NA 138 10/14/2012 1613   K 3.8 10/14/2012 1613   CL 104 10/14/2012 1613   CO2 25 10/14/2012 1613   BUN 11 10/14/2012 1613   CREATININE 0.97 10/14/2012 1613      Component Value Date/Time   CALCIUM 10.0 10/14/2012 1613   ALKPHOS 57 10/14/2012 1613   AST 20 10/14/2012 1613   ALT 22 10/14/2012 1613   BILITOT 0.8 10/14/2012 1613        Chemistry      Component Value Date/Time   NA 138 10/14/2012 1613   K 3.8 10/14/2012 1613   CL 104 10/14/2012 1613   CO2 25 10/14/2012 1613   BUN 11 10/14/2012 1613   CREATININE 0.97 10/14/2012 1613      Component Value Date/Time   CALCIUM 10.0 10/14/2012 1613   ALKPHOS 57 10/14/2012 1613   AST 20 10/14/2012 1613   ALT 22 10/14/2012 1613   BILITOT 0.8 10/14/2012 1613      Nuclear stress test from  12/23/12 reviewed today. Normal nuclear stress test.  Assessment/Plan:  1. Hypertension associated with diabetes Will continue her lisinopril-HCTZ 20-12.5 mg.   2. Diabetes mellitus Not presently symptomatic. Education: Reviewed 'ABCs' of diabetes management (respective goals in parentheses):  A1C (<7), blood pressure (<130/80), and cholesterol (LDL <100). No need to add anti-diabetic medication at present. - POCT glycosylated hemoglobin (Hb A1C) - POCT UA - Microalbumin  3. Allergic rhinitis Under good control with over the counter medication.  4. Obesity, morbid Commended Ms. Forstrom for her intentional weight loss of 12lbs since last  visit.  5. Hyperlipidemia LDL goal <70 Will continue on her Rosuvastatin 20 mg.  6. History of migraine headaches No recent headaches.  7. 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 Cardiology following.  9. Need for prophylactic vaccination and inoculation against unspecified single disease - Pneumococcal polysaccharide vaccine 23-valent greater than or equal to 2yo subcutaneous/IM

## 2013-04-14 NOTE — Progress Notes (Deleted)
  Subjective:    Alicia Lowery is a 52 y.o. female who presents for follow-up of Type 2 diabetes mellitus.    Home blood sugar records: 120 OR LESS  Current symptoms/problems NONE Daily foot checks: ***   Any foot concerns: *** YES/ NONE Last eye exam:     Medication compliance: Current diet: LOW CARB Current exercise: WALKING, Known diabetic complications: {diabetes complications:1215} Cardiovascular risk factors: {cv risk factors:510}   {Common ambulatory SmartLinks:19316}  ROS as in subjective above    Objective:    There were no vitals taken for this visit.  There were no vitals filed for this visit.  General appearence: alert, no distress, WD/WN Neck: supple, no lymphadenopathy, no thyromegaly, no masses Heart: RRR, normal S1, S2, no murmurs Lungs: CTA bilaterally, no wheezes, rhonchi, or rales Abdomen: +bs, soft, non tender, non distended, no masses, no hepatomegaly, no splenomegaly Pulses: 2+ symmetric, upper and lower extremities, normal cap refill Ext: no edema Foot exam:  Neuro: foot monofilament exam normal   Lab Review Lab Results  Component Value Date   HGBA1C 6.5 10/14/2012   Lab Results  Component Value Date   CHOL 148 10/14/2012   HDL 44 10/14/2012   LDLCALC 81 10/14/2012   TRIG 116 10/14/2012   CHOLHDL 3.4 10/14/2012   No results found for this basenameConcepcion Elk     Chemistry      Component Value Date/Time   NA 138 10/14/2012 1613   K 3.8 10/14/2012 1613   CL 104 10/14/2012 1613   CO2 25 10/14/2012 1613   BUN 11 10/14/2012 1613   CREATININE 0.97 10/14/2012 1613      Component Value Date/Time   CALCIUM 10.0 10/14/2012 1613   ALKPHOS 57 10/14/2012 1613   AST 20 10/14/2012 1613   ALT 22 10/14/2012 1613   BILITOT 0.8 10/14/2012 1613        Chemistry      Component Value Date/Time   NA 138 10/14/2012 1613   K 3.8 10/14/2012 1613   CL 104 10/14/2012 1613   CO2 25 10/14/2012 1613   BUN 11 10/14/2012 1613   CREATININE 0.97 10/14/2012 1613      Component  Value Date/Time   CALCIUM 10.0 10/14/2012 1613   ALKPHOS 57 10/14/2012 1613   AST 20 10/14/2012 1613   ALT 22 10/14/2012 1613   BILITOT 0.8 10/14/2012 1613       Last optometry/ophthalmology exam reviewed from:    Assessment:        Plan:    1.  Rx changes: {none:33079} 2.  Education: Reviewed 'ABCs' of diabetes management (respective goals in parentheses):  A1C (<7), blood pressure (<130/80), and cholesterol (LDL <100). 3.  Compliance at present is estimated to be {good/fair/poor:33178}. Efforts to improve compliance (if necessary) will be directed at {compliance:16716}. 4. Follow up: {NUMBERS; 0-10:33138} {time:11}

## 2013-04-24 LAB — HM PAP SMEAR: HM PAP: NORMAL

## 2013-08-04 ENCOUNTER — Other Ambulatory Visit: Payer: Self-pay | Admitting: Family Medicine

## 2013-08-14 ENCOUNTER — Ambulatory Visit: Payer: 59 | Admitting: Family Medicine

## 2013-08-18 ENCOUNTER — Ambulatory Visit: Payer: 59 | Admitting: Family Medicine

## 2013-08-19 ENCOUNTER — Ambulatory Visit (INDEPENDENT_AMBULATORY_CARE_PROVIDER_SITE_OTHER): Payer: 59 | Admitting: Family Medicine

## 2013-08-19 ENCOUNTER — Encounter: Payer: Self-pay | Admitting: Family Medicine

## 2013-08-19 VITALS — BP 120/82 | HR 72 | Wt 258.0 lb

## 2013-08-19 DIAGNOSIS — I10 Essential (primary) hypertension: Secondary | ICD-10-CM

## 2013-08-19 DIAGNOSIS — E1159 Type 2 diabetes mellitus with other circulatory complications: Secondary | ICD-10-CM

## 2013-08-19 DIAGNOSIS — I152 Hypertension secondary to endocrine disorders: Secondary | ICD-10-CM

## 2013-08-19 DIAGNOSIS — E785 Hyperlipidemia, unspecified: Secondary | ICD-10-CM

## 2013-08-19 DIAGNOSIS — Z8249 Family history of ischemic heart disease and other diseases of the circulatory system: Secondary | ICD-10-CM

## 2013-08-19 DIAGNOSIS — E1169 Type 2 diabetes mellitus with other specified complication: Secondary | ICD-10-CM

## 2013-08-19 DIAGNOSIS — E119 Type 2 diabetes mellitus without complications: Secondary | ICD-10-CM

## 2013-08-19 LAB — POCT GLYCOSYLATED HEMOGLOBIN (HGB A1C): HEMOGLOBIN A1C: 6.7

## 2013-08-19 NOTE — Patient Instructions (Addendum)
Check either before eating or 2 hours after eating Yearly eye exams

## 2013-08-19 NOTE — Progress Notes (Signed)
   Subjective:    Patient ID: Alicia Lowery, female    DOB: 07/02/1960, 53 y.o.   MRN: [id number]  HPI She is here for a diabetes followup. She admits to not exercising doing it on the weather and knee pain. She has been through diabetes education including nutritional counseling. She does check her blood sugars and they run in the 120 range however she is only checking them in the morning. Smoking and drinking were reviewed. She has not set up for an eye appointment yet. She does check her feet regularly. She continues on medications listed in the chart.   Review of Systems     Objective:   Physical Exam Alert and in no distress. Hemoglobin A1c is 6.7       Assessment & Plan:  Diabetes mellitus - Plan: HgB A1c  Family history of heart disease in female family member before age 54  Hyperlipidemia LDL goal <70  Hypertension associated with diabetes  Obesity, morbid  Encouraged her to check her blood sugars before and 2 hours after meals. Reinforced the need to make dietary changes and increase her physical activity. Explained that only underlying add I should get in the way of her exercising outdoors. Again explained that the risk of uncontrolled diabetes in regard to stroke, blindness, heart failure, renal failure, neurologic impairment. Also discussed the interaction between weight and her hemoglobin A1c.

## 2013-09-06 ENCOUNTER — Other Ambulatory Visit: Payer: Self-pay | Admitting: Family Medicine

## 2013-09-28 LAB — HM DIABETES EYE EXAM

## 2013-10-01 ENCOUNTER — Ambulatory Visit (INDEPENDENT_AMBULATORY_CARE_PROVIDER_SITE_OTHER): Payer: 59 | Admitting: Medical

## 2013-10-01 ENCOUNTER — Encounter: Payer: Self-pay | Admitting: Medical

## 2013-10-01 VITALS — BP 136/80 | HR 84 | Temp 97.7°F | Resp 19 | Wt 234.0 lb

## 2013-10-01 DIAGNOSIS — S76319A Strain of muscle, fascia and tendon of the posterior muscle group at thigh level, unspecified thigh, initial encounter: Secondary | ICD-10-CM

## 2013-10-01 DIAGNOSIS — IMO0002 Reserved for concepts with insufficient information to code with codable children: Secondary | ICD-10-CM

## 2013-10-01 NOTE — Patient Instructions (Signed)
Hamstring Strain with Rehab The hamstring muscle and tendons are vulnerable to muscle or tendon tear (strain). Hamstring tears cause pain and inflammation in the backside of the thigh, where the hamstring muscles are located. The hamstring is comprised of three muscles that are responsible for straightening the hip, bending the knee, and stabilizing the knee. These muscles are important for walking, running, and jumping. Hamstring strain is the most common injury of the thigh. Hamstring strains are classified as grade 1 or 2 strains. Grade 1 strains cause pain, but the tendon is not lengthened. Grade 2 strains include a lengthened ligament due to the ligament being stretched or partially ruptured. With grade 2 strains there is still function, although the function may be decreased.  SYMPTOMS   Pain, tenderness, swelling, warmth, or redness over the hamstring muscles, at the back of the thigh.  Pain that gets worse during and after intense activity.  A "pop" heard in the area, at the time of injury.  Muscle spasm in the hamstring muscles.  Pain or weakness with running, jumping, or bending the knee against resistance.  Crackling sound (crepitation) when the tendon is moved or touched.  Bruising (contusion) in the thigh within 48 hours of injury.  Loss of fullness of the muscle, or area of muscle bulging in the case of a complete rupture. CAUSES  A muscle strain occurs when a force is placed on the muscle or tendon that is greater than it can withstand. Common causes of injury include:  Strain from overuse or sudden increase in the frequency, duration, or intensity of activity.  Single violent blow or force to the back of the knee or the hamstring area of the thigh. RISK INCREASES WITH:  Sports that require quick starts (sprinting, racquetball, tennis).  Sports that require jumping (basketball, volleyball).  Kicking sports and water skiing.  Contact sports (soccer, football).  Poor  strength and flexibility.  Failure to warm up properly before activity.  Previous thigh, knee, or pelvis injury.  Poor exercise technique.  Poor posture. PREVENTION  Maintain physical fitness:  Strength, flexibility, and endurance.  Cardiovascular fitness.  Learn and use proper exercise technique and posture.  Wear proper fitted and padded protective equipment. PROGNOSIS  If treated properly, hamstring strains are usually curable in 2 to 6 weeks. RELATED COMPLICATIONS   Longer healing time, if not properly treated or if not given adequate time to heal.  Chronically inflamed tendon, causing persistent pain with activity that may progress to constant pain.  Recurring symptoms, if activity is resumed too soon.  Vulnerable to repeated injury (in up to 25% of cases). TREATMENT  Treatment first involves the use of ice and medication to help reduce pain and inflammation. It is also important to complete strengthening and stretching exercises, as well as modifying any activities that aggravate the symptoms. These exercises may be completed at home or with a therapist. Your caregiver may recommend the use of crutches to help reduce pain and discomfort, especially is the strain is severe enough to cause limping. If the tendon has pulled away from the bone, then surgery is necessary to reattach it. MEDICATION   If pain medicine is needed, nonsteroidal anti-inflammatory medicines (aspirin and ibuprofen), or other minor pain relievers (acetaminophen), are often advised.  Do not take pain medicine for 7 days before surgery.  Prescription pain relievers may be given if your caregiver thinks they are needed. Use only as directed and only as much as you need.  Corticosteroid injections may be   recommended. However, these injections should only be used for serious cases, as they can only be given a certain number of times.  Ointments applied to the skin may be beneficial. HEAT AND  COLD  Cold treatment (icing) relieves pain and reduces inflammation. Cold treatment should be applied for 10 to 15 minutes every 2 to 3 hours, and immediately after activity that aggravates your symptoms. Use ice packs or an ice massage.  Heat treatment may be used before performing the stretching and strengthening activities prescribed by your caregiver, physical therapist, or athletic trainer. Use a heat pack or a warm water soak. SEEK MEDICAL CARE IF:   Symptoms get worse or do not improve in 2 weeks, despite treatment.  New, unexplained symptoms develop. (Drugs used in treatment may produce side effects.) EXERCISES RANGE OF MOTION (ROM) AND STRETCHING EXERCISES - Hamstring Strain These exercises may help you when beginning to rehabilitate your injury. Your symptoms may go away with or without further involvement from your physician, physical therapist or athletic trainer. While completing these exercises, remember:   Restoring tissue flexibility helps normal motion to return to the joints. This allows healthier, less painful movement and activity.  An effective stretch should be held for at least 30 seconds.  A stretch should never be painful. You should only feel a gentle lengthening or release in the stretched tissue. STRETCH - Hamstrings, Standing  Stand or sit, and extend your right / left leg, placing your foot on a chair or foot stool.  Keep a slight arch in your low back and your hips straight forward.  Lead with your chest, and lean forward at the waist until you feel a gentle stretch in the back of your right / left knee or thigh. (When done correctly, this exercise requires leaning only a small distance.)  Hold this position for __________ seconds. Repeat __________ times. Complete this stretch __________ times per day. STRETCH  Hamstrings, Supine   Lie on your back. Loop a belt or towel over the ball of your right / left foot.  Straighten your right / left knee and  slowly pull on the belt to raise your leg. Do not allow the right / left knee to bend. Keep your opposite leg flat on the floor.  Raise the leg until you feel a gentle stretch behind your right / left knee or thigh. Hold this position for __________ seconds. Repeat __________ times. Complete this stretch __________ times per day.  STRETCH - Hamstrings, Doorway  Lie on your back with your right / left leg extended and resting on the wall, and the opposite leg flat on the ground through the door. Initially, position your bottom farther away from the wall.  Keep your right / left knee straight. If you feel a stretch behind your knee or thigh, hold this position for __________ seconds.  If you do not feel a stretch, scoot your bottom closer to the door and hold __________ seconds. Repeat __________ times. Complete this stretch __________ times per day.  STRETCH - Hamstrings/Adductors, V-Sit   Sit on the floor with your legs extended in a large "V," keeping your knees straight.  With your head and chest upright, bend at your waist reaching for your left foot to stretch your right thigh muscles.  You should feel a stretch in your right inner thigh. Hold for __________ seconds.  Return to the upright position to relax your leg muscles.  Continuing to keep your chest upright, bend straight forward at your   waist to stretch your hamstrings.  You should feel a stretch behind both of your thighs and knees. Hold for __________ seconds.  Return to the upright position to relax your leg muscles.  With your head and chest upright, bend at your waist reaching for your right foot to stretch your left thigh muscles.  You should feel a stretch in your left inner thigh. Hold for __________ seconds.  Return to the upright position to relax your leg muscles. Repeat __________ times. Complete this exercise __________ times per day.  STRENGTHENING EXERCISES - Hamstring Strain These exercises may help you  when beginning to rehabilitate your injury. They may resolve your symptoms with or without further involvement from your physician, physical therapist or athletic trainer. While completing these exercises, remember:   Muscles can gain both the endurance and the strength needed for everyday activities through controlled exercises.  Complete these exercises as instructed by your physician, physical therapist or athletic trainer. Increase the resistance and repetitions only as guided.  You may experience muscle soreness or fatigue, but the pain or discomfort you are trying to eliminate should never get worse during these exercises. If this pain does get worse, stop and make certain you are following the directions exactly. If the pain is still present after adjustments, discontinue the exercise until you can discuss the trouble with your clinician. STRENGTH - Hip Extensors, Straight Leg Raises   Lie on your stomach on a firm surface.  Tense the muscles in your buttocks to lift your right / left leg about 4 inches. If you cannot lift your leg this high without arching your back, place a pillow under your hips.  Keep your knee straight. Hold for __________ seconds.  Slowly lower your leg to the starting position and allow it to relax completely before starting the next repetition. Repeat __________ times. Complete this exercise __________ times per day.  STRENGTH - Hamstring, Isometrics   Lie on your back on a firm surface.  Bend your right / left knee approximately __________ degrees.  Dig your heel into the surface, as if you are trying to pull it toward your buttocks. Tighten the muscles in the back of your thighs to "dig" as hard as you can, without increasing any pain.  Hold this position for __________ seconds.  Release the tension gradually and allow your muscles to completely relax for __________ seconds between each exercise. Repeat __________ times. Complete this exercise __________  times per day.  STRENGTH - Hamstring, Curls   Lay on your stomach with your legs extended. (If you lay on a bed, your feet may hang over the edge.)  Tighten the muscles in the back of your thigh to bend your right / left knee up to 90 degrees. Keep your hips flat on the bed or floor.  Hold this position for __________ seconds.  Slowly lower your leg back to the starting position. Repeat __________ times. Complete this exercise __________ times per day.  OPTIONAL ANKLE WEIGHTS: Begin with ____________________, but DO NOT exceed ____________________. Increase in 1 pound/0.5 kilogram increments. Document Released: 04/30/2005 Document Revised: 07/23/2011 Document Reviewed: 08/12/2008 [employer] Patient Information 2014 [employer].  

## 2013-10-01 NOTE — Progress Notes (Signed)
Subjective: Here for right leg pain posteriorly, behind knee and calve x a few weeks.  Hurts to stand up.  Hurts to walk.  Denies injury, no fall or trauma.  Does report some right hip pain.  No numbness, weakness, no tingling.  No back pain.  No fever.  No recent MVA.  No prior similar.  Using ibuprofen.  No other aggravating or relieving factors.  No other c/o.  ROS as in subjective  Objective: Gen: obese female, nad Skin: no erythema, ecchymosis, no warmth, no fluctuance or induration MSK: right leg tender along hamstring laterally, worse tenderness at posterior knee/tendon attachment, no deformity, no swelling, no mass, normal ROM, pain with standing and walking, otherwise hip and legs nontender, normal ROM Back: nontender, flexion and extension without pain, relatively full ROM Ext: no edema Legs neurovascularly intact  Assessment: Encounter Diagnosis  Name Primary?  . Hamstring strain Yes   Plan: Discussed her symptoms, exam, and etiology seems to be hamstring strain, no sign of bakers cyst or DVT.  Advised and demonstrated stretches, ROM exercises, advised alternating ice/heat, Aleve BID, and recheck 2wk.

## 2013-10-06 ENCOUNTER — Encounter: Payer: Self-pay | Admitting: Internal Medicine

## 2013-11-17 ENCOUNTER — Telehealth: Payer: Self-pay | Admitting: Internal Medicine

## 2013-11-17 ENCOUNTER — Other Ambulatory Visit: Payer: Self-pay | Admitting: Family Medicine

## 2013-11-17 ENCOUNTER — Other Ambulatory Visit: Payer: Self-pay

## 2013-11-17 MED ORDER — GLUCOSE BLOOD VI STRP: 1.0000 | ORAL_STRIP | Freq: Three times a day (TID) | Status: DC

## 2013-11-17 MED ORDER — ONETOUCH DELICA LANCETS 33G MISC: 1.0000 | Freq: Three times a day (TID) | Status: DC

## 2013-11-17 NOTE — Telephone Encounter (Signed)
Fax came through and need lancets and test striped resubmitted with the exact # of times per day pt test for insurance purposes. [facility]

## 2013-11-17 NOTE — Telephone Encounter (Signed)
DONE

## 2013-11-17 NOTE — Telephone Encounter (Signed)
RESENT BLOOD SUGAR SUPPLYS WITH DIRECTIONS

## 2013-11-30 ENCOUNTER — Other Ambulatory Visit: Payer: Self-pay | Admitting: Family Medicine

## 2013-12-21 ENCOUNTER — Ambulatory Visit (INDEPENDENT_AMBULATORY_CARE_PROVIDER_SITE_OTHER): Payer: 59 | Admitting: Family Medicine

## 2013-12-21 ENCOUNTER — Encounter: Payer: Self-pay | Admitting: Family Medicine

## 2013-12-21 DIAGNOSIS — E785 Hyperlipidemia, unspecified: Secondary | ICD-10-CM

## 2013-12-21 DIAGNOSIS — E119 Type 2 diabetes mellitus without complications: Secondary | ICD-10-CM

## 2013-12-21 DIAGNOSIS — Z79899 Other long term (current) drug therapy: Secondary | ICD-10-CM

## 2013-12-21 DIAGNOSIS — E1169 Type 2 diabetes mellitus with other specified complication: Secondary | ICD-10-CM

## 2013-12-21 DIAGNOSIS — I152 Hypertension secondary to endocrine disorders: Secondary | ICD-10-CM

## 2013-12-21 DIAGNOSIS — I10 Essential (primary) hypertension: Secondary | ICD-10-CM

## 2013-12-21 DIAGNOSIS — E1159 Type 2 diabetes mellitus with other circulatory complications: Secondary | ICD-10-CM

## 2013-12-21 LAB — LIPID PANEL
CHOLESTEROL: 171 mg/dL (ref 0–200)
HDL: 50 mg/dL (ref >39)
LDL Cholesterol: 98 mg/dL (ref 0–99)
TRIGLYCERIDES: 113 mg/dL (ref <150)
Total CHOL/HDL Ratio: 3.4 Ratio
VLDL: 23 mg/dL (ref 0–40)

## 2013-12-21 LAB — CBC WITH DIFFERENTIAL/PLATELET
BASOS ABS: 0 10*3/uL (ref 0.0–0.1)
Basophils Relative: 1 % (ref 0–1)
Eosinophils Absolute: 0.1 10*3/uL (ref 0.0–0.7)
Eosinophils Relative: 2 % (ref 0–5)
HCT: 39.4 % (ref 36.0–46.0)
HEMOGLOBIN: 13.3 g/dL (ref 12.0–15.0)
LYMPHS PCT: 50 % — AB (ref 12–46)
Lymphs Abs: 2.2 10*3/uL (ref 0.7–4.0)
MCH: 30.6 pg (ref 26.0–34.0)
MCHC: 33.8 g/dL (ref 30.0–36.0)
MCV: 90.8 fL (ref 78.0–100.0)
Monocytes Absolute: 0.4 10*3/uL (ref 0.1–1.0)
Monocytes Relative: 10 % (ref 3–12)
NEUTROS ABS: 1.6 10*3/uL — AB (ref 1.7–7.7)
Neutrophils Relative %: 37 % — ABNORMAL LOW (ref 43–77)
Platelets: 250 10*3/uL (ref 150–400)
RBC: 4.34 MIL/uL (ref 3.87–5.11)
RDW: 14.1 % (ref 11.5–15.5)
WBC: 4.3 10*3/uL (ref 4.0–10.5)

## 2013-12-21 LAB — COMPREHENSIVE METABOLIC PANEL
ALBUMIN: 4.3 g/dL (ref 3.5–5.2)
ALT: 21 U/L (ref 0–35)
AST: 20 U/L (ref 0–37)
Alkaline Phosphatase: 57 U/L (ref 39–117)
BUN: 10 mg/dL (ref 6–23)
CALCIUM: 9.9 mg/dL (ref 8.4–10.5)
CO2: 25 mEq/L (ref 19–32)
Chloride: 103 mEq/L (ref 96–112)
Creat: 0.72 mg/dL (ref 0.50–1.10)
Glucose, Bld: 119 mg/dL — ABNORMAL HIGH (ref 70–99)
POTASSIUM: 4 meq/L (ref 3.5–5.3)
SODIUM: 138 meq/L (ref 135–145)
TOTAL PROTEIN: 6.9 g/dL (ref 6.0–8.3)
Total Bilirubin: 0.7 mg/dL (ref 0.2–1.2)

## 2013-12-21 LAB — POCT GLYCOSYLATED HEMOGLOBIN (HGB A1C): Hemoglobin A1C: 6.6

## 2013-12-21 NOTE — Patient Instructions (Signed)
Increase her walking to 20 minutes every day and making dietary changes

## 2013-12-21 NOTE — Progress Notes (Signed)
  Subjective:    Alicia Lowery is a 53 y.o. female who presents for follow-up of Type 2 diabetes mellitus.    Home blood sugar records: 120 AVG  Current symptoms/problems NONE Daily foot checks: Any foot concerns: NONE Last eye exam:  DUNN 09/2013   Medication compliance:Peri [place]. Good Current diet: NONE Current exercise: Walks 10 minutes daily Known diabetic complications: none Cardiovascular risk factors: diabetes mellitus, dyslipidemia, family history of premature cardiovascular disease, hypertension, obesity (BMI >= 30 kg/m2) and sedentary lifestyle   The following portions of the patient's history were reviewed and updated as appropriate: current medications, past family history, past medical history, past social history and problem list.  ROS as in subjective above    Objective:   General appearence: alert, no distress, WD/WN  Foot exam:  Neuro: foot monofilament exam normal   Lab Review Lab Results  Component Value Date   HGBA1C 6.7 08/19/2013   Lab Results  Component Value Date   CHOL 148 10/14/2012   HDL 44 10/14/2012   LDLCALC 81 10/14/2012   TRIG 116 10/14/2012   CHOLHDL 3.4 10/14/2012   No results found for this basenameConcepcion Elk: MICROALBUR, [id number]     Chemistry      Component Value Date/Time   NA 138 10/14/2012 1613   K 3.8 10/14/2012 1613   CL 104 10/14/2012 1613   CO2 25 10/14/2012 1613   BUN 11 10/14/2012 1613   CREATININE 0.97 10/14/2012 1613      Component Value Date/Time   CALCIUM 10.0 10/14/2012 1613   ALKPHOS 57 10/14/2012 1613   AST 20 10/14/2012 1613   ALT 22 10/14/2012 1613   BILITOT 0.8 10/14/2012 1613        Chemistry      Component Value Date/Time   NA 138 10/14/2012 1613   K 3.8 10/14/2012 1613   CL 104 10/14/2012 1613   CO2 25 10/14/2012 1613   BUN 11 10/14/2012 1613   CREATININE 0.97 10/14/2012 1613      Component Value Date/Time   CALCIUM 10.0 10/14/2012 1613   ALKPHOS 57 10/14/2012 1613   AST 20 10/14/2012 1613   ALT 22 10/14/2012 1613   BILITOT 0.8 10/14/2012 1613      Hemoglobin A1c is 6.6     Assessment:   Encounter Diagnoses  Name Primary?  . Obesity, morbid Yes  . Hypertension associated with diabetes   . Hyperlipidemia LDL goal <70   . Type 2 diabetes mellitus without complication   . Encounter for long-term (current) use of other medications          Plan:    1.  Rx changes: No changes 2.  Education: Reviewed 'ABCs' of diabetes management (respective goals in parentheses):  A1C (<7), blood pressure (<130/80), and cholesterol (LDL <100). 3.  Compliance at present is estimated to be fair. Efforts to improve compliance (if necessary) will be directed at dietary modifications: To eating more frequent and smaller meals and increased exercise. 4. Follow up: 4 months  I again discussed the need for her to be more proactive with her diet and exercise. Discussed the fact she probably needs to eat more frequent but smaller meals.

## 2014-03-14 ENCOUNTER — Other Ambulatory Visit: Payer: Self-pay | Admitting: Family Medicine

## 2014-04-22 ENCOUNTER — Encounter: Payer: Self-pay | Admitting: Family Medicine

## 2014-04-22 ENCOUNTER — Ambulatory Visit (INDEPENDENT_AMBULATORY_CARE_PROVIDER_SITE_OTHER): Payer: 59 | Admitting: Family Medicine

## 2014-04-22 VITALS — BP 120/82 | HR 90 | Wt 250.0 lb

## 2014-04-22 DIAGNOSIS — E1169 Type 2 diabetes mellitus with other specified complication: Secondary | ICD-10-CM

## 2014-04-22 DIAGNOSIS — I152 Hypertension secondary to endocrine disorders: Secondary | ICD-10-CM

## 2014-04-22 DIAGNOSIS — E1159 Type 2 diabetes mellitus with other circulatory complications: Secondary | ICD-10-CM

## 2014-04-22 DIAGNOSIS — Z23 Encounter for immunization: Secondary | ICD-10-CM

## 2014-04-22 DIAGNOSIS — E785 Hyperlipidemia, unspecified: Secondary | ICD-10-CM

## 2014-04-22 DIAGNOSIS — E119 Type 2 diabetes mellitus without complications: Secondary | ICD-10-CM

## 2014-04-22 DIAGNOSIS — I10 Essential (primary) hypertension: Secondary | ICD-10-CM

## 2014-04-22 LAB — POCT UA - MICROALBUMIN
ALBUMIN/CREATININE RATIO, URINE, POC: 3.5
Creatinine, POC: 166.5 mg/dL
MICROALBUMIN (UR) POC: 5.9 mg/L

## 2014-04-22 LAB — POCT GLYCOSYLATED HEMOGLOBIN (HGB A1C): Hemoglobin A1C: 6.7

## 2014-04-22 MED ORDER — LISINOPRIL-HYDROCHLOROTHIAZIDE 20-12.5 MG PO TABS: 1.0000 | ORAL_TABLET | Freq: Every day | ORAL | Status: DC

## 2014-04-22 NOTE — Progress Notes (Deleted)
  Subjective:    Alicia Lowery is a 53 y.o. female who presents for follow-up of Type 2 diabetes mellitus.    Home blood sugar records: Patient test one time a day  Current symptoms/problems include none Daily foot checks: ***   Any foot concerns: *** yes/none Last eye exam:  09/28/13   Medication compliance: Current diet:low carb watching fats Current exercise: walking  Known diabetic complications: {diabetes complications:1215} Cardiovascular risk factors: {cv risk factors:510}   {Common ambulatory SmartLinks:19316}  ROS as in subjective above    Objective:    There were no vitals taken for this visit.  There were no vitals filed for this visit.  General appearence: alert, no distress, WD/WN Neck: supple, no lymphadenopathy, no thyromegaly, no masses Heart: RRR, normal S1, S2, no murmurs Lungs: CTA bilaterally, no wheezes, rhonchi, or rales Abdomen: +bs, soft, non tender, non distended, no masses, no hepatomegaly, no splenomegaly Pulses: 2+ symmetric, upper and lower extremities, normal cap refill Ext: no edema Foot exam:  Neuro: foot monofilament exam normal   Lab Review Lab Results  Component Value Date   HGBA1C 6.6 12/21/2013   Lab Results  Component Value Date   CHOL 171 12/21/2013   HDL 50 12/21/2013   LDLCALC 98 12/21/2013   TRIG 113 12/21/2013   CHOLHDL 3.4 12/21/2013   No results found for: Concepcion ElkMICROALBUR, [id number]   Chemistry      Component Value Date/Time   NA 138 12/21/2013 0852   K 4.0 12/21/2013 0852   CL 103 12/21/2013 0852   CO2 25 12/21/2013 0852   BUN 10 12/21/2013 0852   CREATININE 0.72 12/21/2013 0852      Component Value Date/Time   CALCIUM 9.9 12/21/2013 0852   ALKPHOS 57 12/21/2013 0852   AST 20 12/21/2013 0852   ALT 21 12/21/2013 0852   BILITOT 0.7 12/21/2013 0852        Chemistry      Component Value Date/Time   NA 138 12/21/2013 0852   K 4.0 12/21/2013 0852   CL 103 12/21/2013 0852   CO2 25 12/21/2013 0852   BUN 10  12/21/2013 0852   CREATININE 0.72 12/21/2013 0852      Component Value Date/Time   CALCIUM 9.9 12/21/2013 0852   ALKPHOS 57 12/21/2013 0852   AST 20 12/21/2013 0852   ALT 21 12/21/2013 0852   BILITOT 0.7 12/21/2013 16100852       Last optometry/ophthalmology exam reviewed from:    Assessment:        Plan:    1.  Rx changes: {none:33079} 2.  Education: Reviewed 'ABCs' of diabetes management (respective goals in parentheses):  A1C (<7), blood pressure (<130/80), and cholesterol (LDL <100). 3.  Compliance at present is estimated to be {good/fair/poor:33178}. Efforts to improve compliance (if necessary) will be directed at {compliance:16716}. 4. Follow up: {NUMBERS; 0-10:33138} {time:11}

## 2014-04-22 NOTE — Progress Notes (Signed)
Subjective:    Alicia Lowery is a 53 y.o. female who presents for follow-up of Type 2 diabetes mellitus.    Home blood sugar records: Patient test one time a day.  Only remembers one reading above 150 since her last visit.  Current symptoms/problems include none Daily foot checks: Yes   Any foot concerns: No Last eye exam:  09/28/13   Medication compliance: Good Current diet:low carb watching fats Current exercise: walking 3x daily 20 minutes. Known diabetic complications: none Cardiovascular risk factors: diabetes mellitus, dyslipidemia, hypertension and obesity (BMI >= 30 kg/m2)  Noticed pain in left knee, 2/10, started 5 days ago with the cold weather.  Cannot remember any specific stimulus and not too painful that she needs medication.  The following portions of the patient's history were reviewed and updated as appropriate: allergies, current medications, past family history, past medical history, past social history, past surgical history and problem list.  ROS as in subjective above    Objective:    BP 120/82 mmHg  Pulse 90  Wt 250 lb (113.399 kg)  SpO2 97%  Filed Vitals:   04/22/14 0904  BP: 120/82  Pulse: 90    General appearence: alert, no distress, WD/WN Neck: supple, no lymphadenopathy, no thyromegaly, no masses Heart: RRR, normal S1, S2, no murmurs Lungs: CTA bilaterally, no wheezes, rhonchi, or rales Abdomen: +bs, soft, non tender, non distended, no masses, no hepatomegaly, no splenomegaly Pulses: 2+ symmetric, upper and lower extremities, normal cap refill Ext: no edema Foot exam:  Neuro: foot monofilament exam normal  A1C POCT today: 6.7  Lab Review Lab Results  Component Value Date   HGBA1C 6.6 12/21/2013   Lab Results  Component Value Date   CHOL 171 12/21/2013   HDL 50 12/21/2013   LDLCALC 98 12/21/2013   TRIG 113 12/21/2013   CHOLHDL 3.4 12/21/2013   No results found for: Concepcion ElkMICROALBUR, [id number]   Chemistry      Component Value  Date/Time   NA 138 12/21/2013 0852   K 4.0 12/21/2013 0852   CL 103 12/21/2013 0852   CO2 25 12/21/2013 0852   BUN 10 12/21/2013 0852   CREATININE 0.72 12/21/2013 0852      Component Value Date/Time   CALCIUM 9.9 12/21/2013 0852   ALKPHOS 57 12/21/2013 0852   AST 20 12/21/2013 0852   ALT 21 12/21/2013 0852   BILITOT 0.7 12/21/2013 0852        Chemistry      Component Value Date/Time   NA 138 12/21/2013 0852   K 4.0 12/21/2013 0852   CL 103 12/21/2013 0852   CO2 25 12/21/2013 0852   BUN 10 12/21/2013 0852   CREATININE 0.72 12/21/2013 0852      Component Value Date/Time   CALCIUM 9.9 12/21/2013 0852   ALKPHOS 57 12/21/2013 0852   AST 20 12/21/2013 0852   ALT 21 12/21/2013 0852   BILITOT 0.7 12/21/2013 0852         Assessment & Plan:    1.  Rx changes: none 2.  Education: Reviewed 'ABCs' of diabetes management (respective goals in parentheses):  A1C (<7), blood pressure (<130/80), and cholesterol (LDL <100). 3.  Compliance at present is estimated to be fair. Efforts to improve compliance (if necessary) will be directed at increased exercise. 4. Follow up: 4 months    We agreed that she will increase her physical activity to 20 minutes a day, at least 5 days a week.  Further efforts to reduce weight  are imperative.  For now, her diabetes is well-controlled without medication.

## 2014-05-10 ENCOUNTER — Other Ambulatory Visit: Payer: Self-pay | Admitting: Family Medicine

## 2014-08-23 ENCOUNTER — Ambulatory Visit: Payer: 59 | Admitting: Family Medicine

## 2014-08-31 ENCOUNTER — Ambulatory Visit (INDEPENDENT_AMBULATORY_CARE_PROVIDER_SITE_OTHER): Payer: 59 | Admitting: Family Medicine

## 2014-08-31 ENCOUNTER — Encounter: Payer: Self-pay | Admitting: Family Medicine

## 2014-08-31 DIAGNOSIS — E785 Hyperlipidemia, unspecified: Secondary | ICD-10-CM

## 2014-08-31 DIAGNOSIS — E119 Type 2 diabetes mellitus without complications: Secondary | ICD-10-CM

## 2014-08-31 DIAGNOSIS — E1169 Type 2 diabetes mellitus with other specified complication: Secondary | ICD-10-CM | POA: Diagnosis not present

## 2014-08-31 DIAGNOSIS — I152 Hypertension secondary to endocrine disorders: Secondary | ICD-10-CM

## 2014-08-31 DIAGNOSIS — J301 Allergic rhinitis due to pollen: Secondary | ICD-10-CM

## 2014-08-31 DIAGNOSIS — I10 Essential (primary) hypertension: Secondary | ICD-10-CM | POA: Diagnosis not present

## 2014-08-31 DIAGNOSIS — E1159 Type 2 diabetes mellitus with other circulatory complications: Secondary | ICD-10-CM

## 2014-08-31 LAB — POCT GLYCOSYLATED HEMOGLOBIN (HGB A1C): HEMOGLOBIN A1C: 6.9

## 2014-08-31 NOTE — Patient Instructions (Signed)
Take half an hour out of all your breaks and use it for walking

## 2014-08-31 NOTE — Progress Notes (Signed)
  Subjective:    Patient ID: Alicia Lowery, female    DOB: 02/09/1961, 54 y.o.   MRN: [id number]  Alicia Lowery is a 54 y.o. female who presents for follow-up of Type 2 diabetes mellitus.her allergies are under good control.  Home blood sugar records: Patient test one to two times a day Current symptoms/problems numbness left hand Daily foot checks:   Any foot concerns: none Exercise: walking twice per week. She states that things get in the waiting keep her from exercising more. Eye;09/28/2013 Dunn The following portions of the patient's history were reviewed and updated as appropriate: allergies, current medications, past medical history, past social history and problem list.  ROS as in subjective above.     Objective:    Physical Exam Alert and in no distress otherwise not examined.   Lab Review Diabetic Labs Latest Ref Rng 04/22/2014 12/21/2013 08/19/2013 04/14/2013 10/14/2012  HbA1c - 6.7 6.6 6.7 6.2 6.5  Chol 0 - 200 mg/dL - 409171 - - 811148  HDL >91>39 mg/dL - 50 - - 44  Calc LDL 0 - 99 mg/dL - 98 - - 81  Triglycerides <150 mg/dL - 478113 - - 295116  Creatinine 0.50 - 1.10 mg/dL - 6.210.72 - - 3.080.97   BP/Weight 04/22/2014 12/21/2013 10/01/2013 08/19/2013 04/14/2013  Systolic BP 120 122 136 120 130  Diastolic BP 82 80 80 82 80  Wt. (Lbs) 250 255 234 258 244  BMI 44.3 45.18 41.46 45.71 43.23   Foot/eye exam completion dates Latest Ref Rng 04/22/2014 12/21/2013  Eye Exam No Retinopathy - -  Foot Form Completion - Done Done   Hemoglobin A1c is 6.9 Alicia Lowery  reports that she has never smoked. She has never used smokeless tobacco. She reports that she does not drink alcohol or use illicit drugs.     Assessment & Plan:     Obesity, morbid  Hypertension associated with diabetes  Hyperlipidemia LDL goal <70  Type 2 diabetes mellitus without complication - Plan: POCT glycosylated hemoglobin (Hb A1C)  Allergic rhinitis due to pollen   1. Rx changes: none 2. Education: Reviewed 'ABCs' of diabetes  management (respective goals in parentheses):  A1C (<7), blood pressure (<130/80), and cholesterol (LDL <100). 3. Compliance at present is estimated to be fair. Efforts to improve compliance (if necessary) will be directed at increased exercise.I discussed walking during her morning, lunch and afternoon breaks while at work trying to get up to a half an hour of walking every day. 4. Follow up: 4 months

## 2015-01-06 ENCOUNTER — Ambulatory Visit: Payer: 59 | Admitting: Family Medicine

## 2015-03-03 ENCOUNTER — Ambulatory Visit: Payer: 59 | Admitting: Family Medicine

## 2015-03-04 ENCOUNTER — Encounter: Payer: Self-pay | Admitting: Family Medicine

## 2015-03-04 ENCOUNTER — Ambulatory Visit (INDEPENDENT_AMBULATORY_CARE_PROVIDER_SITE_OTHER): Payer: 59 | Admitting: Family Medicine

## 2015-03-04 VITALS — BP 120/70 | HR 80 | Ht 63.0 in | Wt 258.0 lb

## 2015-03-04 DIAGNOSIS — E119 Type 2 diabetes mellitus without complications: Secondary | ICD-10-CM

## 2015-03-04 DIAGNOSIS — E785 Hyperlipidemia, unspecified: Secondary | ICD-10-CM

## 2015-03-04 DIAGNOSIS — Z79899 Other long term (current) drug therapy: Secondary | ICD-10-CM | POA: Diagnosis not present

## 2015-03-04 DIAGNOSIS — I10 Essential (primary) hypertension: Secondary | ICD-10-CM

## 2015-03-04 DIAGNOSIS — Z23 Encounter for immunization: Secondary | ICD-10-CM | POA: Diagnosis not present

## 2015-03-04 DIAGNOSIS — E1159 Type 2 diabetes mellitus with other circulatory complications: Secondary | ICD-10-CM

## 2015-03-04 DIAGNOSIS — I152 Hypertension secondary to endocrine disorders: Secondary | ICD-10-CM

## 2015-03-04 LAB — CBC WITH DIFFERENTIAL/PLATELET
Basophils Absolute: 0 10*3/uL (ref 0.0–0.1)
Basophils Relative: 1 % (ref 0–1)
EOS ABS: 0.1 10*3/uL (ref 0.0–0.7)
EOS PCT: 4 % (ref 0–5)
HCT: 36.3 % (ref 36.0–46.0)
Hemoglobin: 12.3 g/dL (ref 12.0–15.0)
Lymphocytes Relative: 42 % (ref 12–46)
Lymphs Abs: 1.6 10*3/uL (ref 0.7–4.0)
MCH: 30.6 pg (ref 26.0–34.0)
MCHC: 33.9 g/dL (ref 30.0–36.0)
MCV: 90.3 fL (ref 78.0–100.0)
MPV: 9.5 fL (ref 8.6–12.4)
Monocytes Absolute: 0.6 10*3/uL (ref 0.1–1.0)
Monocytes Relative: 15 % — ABNORMAL HIGH (ref 3–12)
Neutro Abs: 1.4 10*3/uL — ABNORMAL LOW (ref 1.7–7.7)
Neutrophils Relative %: 38 % — ABNORMAL LOW (ref 43–77)
PLATELETS: 231 10*3/uL (ref 150–400)
RBC: 4.02 MIL/uL (ref 3.87–5.11)
RDW: 14.2 % (ref 11.5–15.5)
WBC: 3.7 10*3/uL — ABNORMAL LOW (ref 4.0–10.5)

## 2015-03-04 LAB — COMPREHENSIVE METABOLIC PANEL
ALT: 18 U/L (ref 6–29)
AST: 18 U/L (ref 10–35)
Albumin: 3.9 g/dL (ref 3.6–5.1)
Alkaline Phosphatase: 50 U/L (ref 33–130)
BUN: 11 mg/dL (ref 7–25)
CHLORIDE: 105 mmol/L (ref 98–110)
CO2: 28 mmol/L (ref 20–31)
Calcium: 9.6 mg/dL (ref 8.6–10.4)
Creat: 0.81 mg/dL (ref 0.50–1.05)
Glucose, Bld: 125 mg/dL — ABNORMAL HIGH (ref 65–99)
POTASSIUM: 4.5 mmol/L (ref 3.5–5.3)
Sodium: 141 mmol/L (ref 135–146)
TOTAL PROTEIN: 6.9 g/dL (ref 6.1–8.1)
Total Bilirubin: 0.7 mg/dL (ref 0.2–1.2)

## 2015-03-04 LAB — LIPID PANEL
CHOL/HDL RATIO: 4.1 ratio (ref <=5.0)
CHOLESTEROL: 172 mg/dL (ref 125–200)
HDL: 42 mg/dL — AB (ref >=46)
LDL Cholesterol: 105 mg/dL (ref <130)
Triglycerides: 126 mg/dL (ref <150)
VLDL: 25 mg/dL (ref <30)

## 2015-03-04 LAB — POCT GLYCOSYLATED HEMOGLOBIN (HGB A1C): Hemoglobin A1C: 6.8

## 2015-03-04 NOTE — Progress Notes (Signed)
  Subjective:    Patient ID: Alicia Lowery, female    DOB: 12/16/1960, 54 y.o.   MRN: [id number]  Alicia Lowery is a 54 y.o. female who presents for follow-up of Type 2 diabetes mellitus.  Home blood sugar records: PATIENT TEST ONE TIME  Current symptoms/problems NONE Daily foot checks:YES   Any foot concerns:  none Exercise: walking 20 min  intermittently Eyes: 09/2013 next visit 1/17 dr.dunn The following portions of the patient's history were reviewed and updated as appropriate: allergies, current medications, past medical history, past social history and problem list.  ROS as in subjective above.     Objective:    Physical Exam Alert and in no distress otherwise not examined.   Lab Review Diabetic Labs Latest Ref Rng 08/31/2014 04/22/2014 12/21/2013 08/19/2013 04/14/2013  HbA1c - 6.9 6.7 6.6 6.7 6.2  Chol 0 - 200 mg/dL - - 962171 - -  HDL >95>39 mg/dL - - 50 - -  Calc LDL 0 - 99 mg/dL - - 98 - -  Triglycerides <150 mg/dL - - 284113 - -  Creatinine 0.50 - 1.10 mg/dL - - 1.320.72 - -   BP/Weight 08/31/2014 04/22/2014 12/21/2013 10/01/2013 08/19/2013  Systolic BP 126 120 122 136 120  Diastolic BP 80 82 80 80 82  Wt. (Lbs) 257.8 250 255 234 258  BMI 45.68 44.3 45.18 41.46 45.71   Foot/eye exam completion dates Latest Ref Rng 04/22/2014 12/21/2013  Eye Exam No Retinopathy - -  Foot Form Completion - Done Done   foot exam completed today although pulses were impossible to obtain due to her obesity.  Alicia Lowery  reports that she has never smoked. She has never used smokeless tobacco. She reports that she does not drink alcohol or use illicit drugs. A1C 68     Assessment & Plan:    Morbid obesity due to excess calories (HCC) - Plan: CBC with Differential/Platelet, Comprehensive metabolic panel, Lipid panel  Hypertension associated with diabetes (HCC) - Plan: CBC with Differential/Platelet, Comprehensive metabolic panel  Hyperlipidemia LDL goal <70 - Plan: Lipid panel  Type 2 diabetes mellitus  without complication, without long-term current use of insulin (HCC) - Plan: POCT glycosylated hemoglobin (Hb A1C), POCT UA - Microalbumin  Need for prophylactic vaccination and inoculation against influenza - Plan: Flu Vaccine QUAD 36+ mos PF IM (Fluarix & Fluzone Quad PF)  Encounter for long-term (current) use of medications - Plan: CBC with Differential/Platelet, Comprehensive metabolic panel, Lipid panel   1. Rx changes: none 2. Education: Reviewed 'ABCs' of diabetes management (respective goals in parentheses):  A1C (<7), blood pressure (<130/80), and cholesterol (LDL <100). 3. Compliance at present is estimated to be fair. Efforts to improve compliance (if necessary) will be directed at increased exercise. 4. Follow up: 4 months She will also be set up for a Pap smear. She will set up to have her mammogram done. She is having some perimenopausal symptoms; irregular menses. I again stressed the need for her to make lifestyle changes specifically increasing her physical activity to help with weight reduction.

## 2015-05-23 ENCOUNTER — Other Ambulatory Visit: Payer: Self-pay | Admitting: Family Medicine

## 2015-06-23 ENCOUNTER — Ambulatory Visit: Payer: 59 | Admitting: Family Medicine

## 2015-07-01 ENCOUNTER — Telehealth: Payer: Self-pay | Admitting: Family Medicine

## 2015-07-01 NOTE — Telephone Encounter (Signed)
Robitussin-DM during the day and NyQuil at night 

## 2015-07-01 NOTE — Telephone Encounter (Signed)
Left message advising patient to try robitussin dm during the day and nyquil at night.

## 2015-07-01 NOTE — Telephone Encounter (Signed)
Pt called and was wondering what she could take  She has a bad cough, and some congestion, wants to know is the best thing to take over the counter, says it is a nagging cough, pt can be reached at [phone] (M). Please advise

## 2015-07-20 ENCOUNTER — Encounter: Payer: Self-pay | Admitting: Family Medicine

## 2015-07-20 ENCOUNTER — Ambulatory Visit (INDEPENDENT_AMBULATORY_CARE_PROVIDER_SITE_OTHER): Payer: 59 | Admitting: Family Medicine

## 2015-07-20 VITALS — BP 126/80 | HR 79 | Wt 257.0 lb

## 2015-07-20 DIAGNOSIS — Z8249 Family history of ischemic heart disease and other diseases of the circulatory system: Secondary | ICD-10-CM | POA: Diagnosis not present

## 2015-07-20 DIAGNOSIS — I10 Essential (primary) hypertension: Secondary | ICD-10-CM | POA: Diagnosis not present

## 2015-07-20 DIAGNOSIS — E1169 Type 2 diabetes mellitus with other specified complication: Secondary | ICD-10-CM

## 2015-07-20 DIAGNOSIS — E1159 Type 2 diabetes mellitus with other circulatory complications: Secondary | ICD-10-CM | POA: Diagnosis not present

## 2015-07-20 DIAGNOSIS — Z8669 Personal history of other diseases of the nervous system and sense organs: Secondary | ICD-10-CM | POA: Diagnosis not present

## 2015-07-20 DIAGNOSIS — J301 Allergic rhinitis due to pollen: Secondary | ICD-10-CM

## 2015-07-20 DIAGNOSIS — I152 Hypertension secondary to endocrine disorders: Secondary | ICD-10-CM

## 2015-07-20 DIAGNOSIS — E785 Hyperlipidemia, unspecified: Secondary | ICD-10-CM | POA: Diagnosis not present

## 2015-07-20 DIAGNOSIS — E119 Type 2 diabetes mellitus without complications: Secondary | ICD-10-CM

## 2015-07-20 LAB — POCT GLYCOSYLATED HEMOGLOBIN (HGB A1C): HEMOGLOBIN A1C: 6.7

## 2015-07-20 NOTE — Progress Notes (Signed)
  Subjective:    Patient ID: Alicia Lowery, female    DOB: 04/11/1961, 55 y.o.   MRN: [id number]  Alicia Lowery is a 55 y.o. female who presents for follow-up of Type 2 diabetes mellitus. Her allergies are under good control. She has not had a migraine in quite some time. She does have a positive family history of heart disease as well as her underlying diabetes. She has been through diabetes education and admits to not really using all the information she has. She does plan on setting up an eye exam.  Patient is checking home blood sugars.   Home blood sugar records: No How often is blood sugars being checked: PRN Current symptoms/problems include none and have been stable. Daily foot checks: Yes   Any foot concerns: No Last eye exam: Due Exercise: Not regular  The following portions of the patient's history were reviewed and updated as appropriate: allergies, current medications, past medical history, past social history and problem list.  ROS as in subjective above.     Objective:    Physical Exam Alert and in no distress otherwise not examined.  Blood pressure 126/80, pulse 79, weight 257 lb (116.574 kg), SpO2 97 %.  Lab Review Diabetic Labs Latest Ref Rng 07/20/2015 03/04/2015 08/31/2014 04/22/2014 12/21/2013  HbA1c - 6.7 6.8 6.9 6.7 6.6  Chol 125 - 200 mg/dL - 409172 - - 811171  HDL >=91>=46 mg/dL - 47(W42(L) - - 50  Calc LDL <130 mg/dL - 295105 - - 98  Triglycerides <150 mg/dL - 621126 - - 308113  Creatinine 0.50 - 1.05 mg/dL - 6.570.81 - - 8.460.72   BP/Weight 07/20/2015 03/04/2015 08/31/2014 04/22/2014 12/21/2013  Systolic BP 126 120 126 120 122  Diastolic BP 80 70 80 82 80  Wt. (Lbs) 257 258 257.8 250 255  BMI 45.54 45.71 45.68 44.3 45.18   Foot/eye exam completion dates Latest Ref Rng 03/04/2015 04/22/2014  Eye Exam No Retinopathy - -  Foot Form Completion - Done Done  A1c is 6.7  Alicia Lowery  reports that she has never smoked. She has never used smokeless tobacco. She reports that she does not drink  alcohol or use illicit drugs.     Assessment & Plan:    Type 2 diabetes mellitus without complication, unspecified long term insulin use status (HCC) - Plan: POCT glycosylated hemoglobin (Hb A1C)  Morbid obesity due to excess calories (HCC)  Hypertension associated with diabetes (HCC)  Hyperlipidemia associated with type 2 diabetes mellitus (HCC)  History of migraine headaches  Family history of heart disease in female family member before age 56  Allergic rhinitis due to pollen   Rx changes: none  Education: Reviewed 'ABCs' of diabetes management (respective goals in parentheses):  A1C (<7), blood pressure (<130/80), and cholesterol (LDL <100).  Compliance at present is estimated to be fair. Efforts to improve compliance (if necessary) will be directed at increased exercise.  Follow up: 4 months Had a long discussion with her concerning making the dietary changes that she was educated on and also increasing her physical activity to 20 minutes per day. Recommend spreading this out throughout her workday since she does have morning lunch and afternoon breaks.

## 2015-07-20 NOTE — Patient Instructions (Addendum)
20 minutes a day of something physical 

## 2015-11-23 ENCOUNTER — Ambulatory Visit: Payer: 59 | Admitting: Family Medicine

## 2015-12-29 ENCOUNTER — Encounter: Payer: Self-pay | Admitting: Family Medicine

## 2015-12-29 ENCOUNTER — Ambulatory Visit (INDEPENDENT_AMBULATORY_CARE_PROVIDER_SITE_OTHER): Payer: 59 | Admitting: Family Medicine

## 2015-12-29 VITALS — BP 122/78 | HR 79 | Ht 63.0 in | Wt 256.0 lb

## 2015-12-29 DIAGNOSIS — E119 Type 2 diabetes mellitus without complications: Secondary | ICD-10-CM

## 2015-12-29 DIAGNOSIS — I152 Hypertension secondary to endocrine disorders: Secondary | ICD-10-CM

## 2015-12-29 DIAGNOSIS — Z23 Encounter for immunization: Secondary | ICD-10-CM

## 2015-12-29 DIAGNOSIS — E1159 Type 2 diabetes mellitus with other circulatory complications: Secondary | ICD-10-CM | POA: Diagnosis not present

## 2015-12-29 DIAGNOSIS — E1169 Type 2 diabetes mellitus with other specified complication: Secondary | ICD-10-CM | POA: Diagnosis not present

## 2015-12-29 DIAGNOSIS — E785 Hyperlipidemia, unspecified: Secondary | ICD-10-CM | POA: Diagnosis not present

## 2015-12-29 DIAGNOSIS — I10 Essential (primary) hypertension: Secondary | ICD-10-CM

## 2015-12-29 LAB — POCT UA - MICROALBUMIN
ALBUMIN/CREATININE RATIO, URINE, POC: 46.5
Creatinine, POC: 124.4 mg/dL
MICROALBUMIN (UR) POC: 57.9 mg/L

## 2015-12-29 LAB — POCT GLYCOSYLATED HEMOGLOBIN (HGB A1C): HEMOGLOBIN A1C: 7.1

## 2015-12-29 NOTE — Patient Instructions (Signed)
Increase your physical activities but at least 5 minutes every day and cut back on carbs

## 2015-12-29 NOTE — Progress Notes (Signed)
  Subjective:    Patient ID: Alicia Lowery, female    DOB: 03/16/1961, 55 y.o.   MRN: [id number]  Alicia Lowery is a 55 y.o. female who presents for follow-up of Type 2 diabetes mellitus.  Patient is checking home blood sugars.   Home blood sugar records: 84 to 170 She has been checking them randomly. How often is blood sugars being checked: once a week Current symptoms/problems  none Daily foot checks: yes   Any foot concerns:  Last eye exam: pt has appointment Dr.P. Dunn Exercise: walking 7 days a week 15 min per day She continues on her lisinopril and Crestor as well as aspirin. She is having no difficulty with these medications. The following portions of the patient's history were reviewed and updated as appropriate: allergies, current medications, past medical history, past social history and problem list. Immunizations also reviewed  ROS as in subjective above.     Objective:    Physical Exam Alert and in no distress otherwise not examined.   Lab Review Diabetic Labs Latest Ref Rng & Units 07/20/2015 03/04/2015 08/31/2014 04/22/2014 12/21/2013  HbA1c - 6.7 6.8 6.9 6.7 6.6  Chol 125 - 200 mg/dL - 782172 - - 956171  HDL >=21>=46 mg/dL - 30(Q42(L) - - 50  Calc LDL <130 mg/dL - 657105 - - 98  Triglycerides <150 mg/dL - 846126 - - 962113  Creatinine 0.50 - 1.05 mg/dL - 9.520.81 - - 8.410.72   BP/Weight 12/29/2015 07/20/2015 03/04/2015 08/31/2014 04/22/2014  Systolic BP 122 126 120 126 120  Diastolic BP 78 80 70 80 82  Wt. (Lbs) 256 257 258 257.8 250  BMI 45.35 45.54 45.71 45.68 44.3   Foot/eye exam completion dates Latest Ref Rng & Units 03/04/2015 04/22/2014  Eye Exam No Retinopathy - -  Foot Form Completion - Done Done  Hemoglobin A1c is 7.1  Alicia Lowery  reports that she has never smoked. She has never used smokeless tobacco. She reports that she does not drink alcohol or use drugs.     Assessment & Plan:    Type 2 diabetes mellitus without complication, unspecified long term insulin use status (HCC) - Plan:  POCT UA - Microalbumin, POCT glycosylated hemoglobin (Hb A1C)  Need for prophylactic vaccination against Streptococcus pneumoniae (pneumococcus) - Plan: Pneumococcal conjugate vaccine 13-valent  Need for influenza vaccination - Plan: Flu Vaccine QUAD 36+ mos IM  Morbid obesity due to excess calories (HCC)  Hypertension associated with diabetes (HCC)  Hyperlipidemia associated with type 2 diabetes mellitus (HCC)   1. Rx changes: none 2. Education: Reviewed 'ABCs' of diabetes management (respective goals in parentheses):  A1C (<7), blood pressure (<130/80), and cholesterol (LDL <100). 3. Compliance at present is estimated to be fair. Efforts to improve compliance (if necessary) will be directed at increased exercise. 4. Follow up: 4 months Also discussed cutting back on carbohydrates. Recommend she increase her exercise by at least 5 minutes every day. Discussed the fact that she will eventually need to be placed on a diabetes medicine if she does not make further improvements. Discussed the benefit of weight loss.

## 2016-01-07 ENCOUNTER — Other Ambulatory Visit: Payer: Self-pay | Admitting: Family Medicine

## 2016-05-25 ENCOUNTER — Other Ambulatory Visit: Payer: Self-pay | Admitting: Family Medicine

## 2016-05-28 ENCOUNTER — Ambulatory Visit: Payer: 59 | Admitting: Family Medicine

## 2016-06-28 ENCOUNTER — Ambulatory Visit: Payer: 59 | Admitting: Family Medicine

## 2016-07-19 ENCOUNTER — Ambulatory Visit: Payer: 59 | Admitting: Family Medicine

## 2016-08-17 ENCOUNTER — Ambulatory Visit (HOSPITAL_COMMUNITY)
Admission: EM | Admit: 2016-08-17 | Discharge: 2016-08-17 | Disposition: A | Discharge status: Home / Self Care | Payer: 59 | Attending: Internal Medicine | Admitting: Internal Medicine

## 2016-08-17 ENCOUNTER — Encounter (HOSPITAL_COMMUNITY): Payer: Self-pay | Admitting: Emergency Medicine

## 2016-08-17 DIAGNOSIS — H66002 Acute suppurative otitis media without spontaneous rupture of ear drum, left ear: Secondary | ICD-10-CM

## 2016-08-17 DIAGNOSIS — H9192 Unspecified hearing loss, left ear: Secondary | ICD-10-CM

## 2016-08-17 NOTE — ED Provider Notes (Signed)
CSN: [id number]     Arrival date & time 08/17/16  1803 History   First MD Initiated Contact with Patient 08/17/16 1939     Chief Complaint  Patient presents with  . Hearing Loss   (Consider location/radiation/quality/duration/timing/severity/associated sxs/prior Treatment) Pleasant 56 year old female complaining of decreased hearing in the left ear for the past 3-4 days. Denies pain. She points to the tragus and states it is mildly tender. No other complaints.      Past Medical History:  Diagnosis Date  . Allergy   . Cardiovascular disease    Family History  . Diabetes mellitus   . Dyslipidemia   . Hemorrhoids   . History of migraine headaches   . Hypertension   . Obesity    Past Surgical History:  Procedure Laterality Date  . TEAR DUCT PROBING     Reuel Boom   Family History  Problem Relation Age of Onset  . Arthritis Mother   . Hypertension Mother   . Cancer Mother     Lung cancer  . Heart disease Father     Died age 7  . Hypertension Father   . Arthritis Sister   . Hypertension Sister   . Arthritis Brother   . COPD Maternal Grandmother   . Arthritis Maternal Grandmother   . Colon cancer Neg Hx    Social History  Substance Use Topics  . Smoking status: Never Smoker  . Smokeless tobacco: Never Used  . Alcohol use No   OB History    No data available     Review of Systems  Constitutional: Negative.   HENT: Negative for ear discharge and ear pain.        As per history of present illness  All other systems reviewed and are negative.   Allergies  Grapeseed extract [nutritional supplements]  Home Medications   Prior to Admission medications   Medication Sig Start Date End Date Taking? Authorizing Provider  aspirin 81 MG tablet Take 81 mg by mouth daily.     Yes Historical Provider, MD  CRESTOR 20 MG tablet TAKE 1 TABLET EVERY DAY 03/15/14  Yes Ronnald Nian, MD  lisinopril-hydrochlorothiazide (PRINZIDE,ZESTORETIC) 20-12.5 MG tablet TAKE  1 TABLET BY MOUTH DAILY. 05/25/16  Yes Ronnald Nian, MD  ONE [facility] ULTRA TEST test strip TEST 3 TIMES A DAY 01/09/16   Ronnald Nian, MD  [facility] DELICA LANCETS 33G MISC USE AS DIRECTED 3 TIMES A DAY 05/10/14   Ronnald Nian, MD   Meds Ordered and Administered this Visit  Medications - No data to display  BP (!) 156/77 (BP Location: Left Arm) Comment: notified cma  Pulse 73   Temp 98.7 F (37.1 C) (Oral)   Resp 16   LMP 08/02/2016   SpO2 99%  No data found.   Physical Exam  Constitutional: She is oriented to person, place, and time. She appears well-developed and well-nourished. No distress.  HENT:  Head: Normocephalic and atraumatic.  Right Ear: External ear normal.  Left Ear: External ear normal.  Nose: Nose normal.  Mouth/Throat: Oropharynx is clear and moist.  Right EAC with cerumen. Approximately half of the TM is visualized and pearly gray, no erythema or bulging.  Left TM is partially seen but obstructed with cerumen. Oropharynx clear.  TM post irrigation is without erythema. There is some increased opacity toward the lower half of the TM. No bulging that this may represent some fluid accumulation. No signs of infection.    Eyes:  EOM are normal.  Neck: Normal range of motion. Neck supple.  Pulmonary/Chest: Effort normal.  Musculoskeletal: Normal range of motion. She exhibits no edema.  Lymphadenopathy:    She has no cervical adenopathy.  Neurological: She is alert and oriented to person, place, and time.  Skin: Skin is warm.  Psychiatric: She has a normal mood and affect.  Nursing note and vitals reviewed.   Urgent Care Course     Procedures (including critical care time)  Labs Review Labs Reviewed - No data to display  Imaging Review No results found.   Visual Acuity Review  Right Eye Distance: 20/50 Left Eye Distance: 20/50 Bilateral Distance: 20/50  Right Eye Near:   Left Eye Near:    Bilateral Near:         MDM   1. Decreased  hearing of left ear   2. Acute suppurative otitis media of left ear without spontaneous rupture of tympanic membrane, recurrence not specified    This may be due to a condition called otitis medias superlative. It does not appear to be a bacterial type infection. There may be some fluid behind the ear causing the decrease in hearing. Recommend seeing audiologist and ENT relatively soon. He may call your primary care provider Monday to see if he can make a referral or see you send to make a referral he may also try calling the telephone number listed on this page to [facility] to see if they will see you without a referral. If you develop pain or other symptoms may return.     Hayden Rasmussen, NP 08/17/16 2018

## 2016-08-17 NOTE — Discharge Instructions (Signed)
This may be due to a condition called otitis medias superlative. It does not appear to be a bacterial type infection. There may be some fluid behind the ear causing the decrease in hearing. Recommend seeing audiologist and ENT relatively soon. He may call your primary care provider Monday to see if he can make a referral or see you send to make a referral he may also try calling the telephone number listed on this page to [facility] ENT to see if they will see you without a referral. If you develop pain or other symptoms may return.

## 2016-08-17 NOTE — ED Triage Notes (Signed)
c/o decreased hearing on left ear onset 3 days.   Denies fevers, chills

## 2016-08-23 ENCOUNTER — Ambulatory Visit (INDEPENDENT_AMBULATORY_CARE_PROVIDER_SITE_OTHER): Payer: 59 | Admitting: Family Medicine

## 2016-08-23 ENCOUNTER — Encounter: Payer: Self-pay | Admitting: Family Medicine

## 2016-08-23 VITALS — BP 120/80 | HR 97 | Wt 264.0 lb

## 2016-08-23 DIAGNOSIS — H9192 Unspecified hearing loss, left ear: Secondary | ICD-10-CM

## 2016-08-23 NOTE — Progress Notes (Signed)
   Subjective:    Patient ID: Alicia Lowery, female    DOB: [birth year], 56 y.o.   MRN: [id number]  HPI She was seen recently in an urgent care for evaluation of left hearing loss. Record was reviewed. They did lavage the ear and apparently it was normal. She is instructed to come here for possible referral to ENT. She is having only minor discomfort but no sore throat, fever, chills, cough or congestion.   Review of Systems     Objective:   Physical Exam Her tendon no distress. Left TM and canal are normal. Right TM and canal was not seen due to cerumen. Neck is supple without adenopathy.       Assessment & Plan:  Hearing loss of left ear, unspecified hearing loss type I will have her use Afrin nasal spray daily for the next several days and if continued difficulty with hearing, refer to ENT.

## 2016-08-27 ENCOUNTER — Telehealth: Payer: Self-pay | Admitting: Family Medicine

## 2016-08-27 NOTE — Telephone Encounter (Signed)
Pt left voice mail stating she is not better, just slightly better. Patient wants to know what Dr. Susann Givens wants her to do.

## 2016-08-27 NOTE — Telephone Encounter (Signed)
Get an appointment with ENT

## 2016-08-28 NOTE — Telephone Encounter (Signed)
I have faxed referral to [facility] ENT

## 2016-11-18 ENCOUNTER — Other Ambulatory Visit: Payer: Self-pay | Admitting: Family Medicine

## 2016-12-19 ENCOUNTER — Encounter (HOSPITAL_COMMUNITY): Payer: Self-pay | Admitting: Emergency Medicine

## 2016-12-19 ENCOUNTER — Ambulatory Visit: Payer: 59 | Admitting: Family Medicine

## 2016-12-19 ENCOUNTER — Ambulatory Visit (HOSPITAL_COMMUNITY)
Admission: EM | Admit: 2016-12-19 | Discharge: 2016-12-19 | Disposition: A | Discharge status: Home / Self Care | Payer: 59 | Attending: Family Medicine | Admitting: Family Medicine

## 2016-12-19 DIAGNOSIS — H5789 Other specified disorders of eye and adnexa: Secondary | ICD-10-CM

## 2016-12-19 DIAGNOSIS — H578 Other specified disorders of eye and adnexa: Secondary | ICD-10-CM | POA: Diagnosis not present

## 2016-12-19 DIAGNOSIS — M62838 Other muscle spasm: Secondary | ICD-10-CM | POA: Diagnosis not present

## 2016-12-19 LAB — HM DIABETES EYE EXAM

## 2016-12-19 NOTE — ED Provider Notes (Signed)
[facility]    CSN: [id number] Arrival date & time: 12/19/16  1004     History   Chief Complaint Chief Complaint  Patient presents with  . Eye Problem    HPI Alicia Lowery is a 56 y.o. female.   HPI   Patient states her eye is jumping,started this morning when patient woke up at 6:30 AM. States that she does not have a muscle spasm, rather the vision seems to be jumping. Denies taking any drugs. Has hx of diabetes, last eyes exam was in 2016. Indicates the eye has normal clarity. Not erythematous. No floaters, no flashes of light.  Patient states that it is not painful at all, denies it begin itchy. No trauma to the eye. No headaches, no nausea, no vomiting. No foreign body sensation in the eye. No photophobia   Red Flags  Reduction of visual acuity - not change in clarity  Photophobia - none  Severe foreign body sensation that prevents the patient from keeping the eye open -none Corneal opacity- none  Fixed pupil- none    Visual acuity exam: 20/30 bilaterally    Past Medical History:  Diagnosis Date  . Allergy   . Cardiovascular disease    Family History  . Diabetes mellitus   . Dyslipidemia   . Hemorrhoids   . History of migraine headaches   . Hypertension   . Obesity     Patient Active Problem List   Diagnosis Date Noted  . Obesity, morbid (HCC) 09/28/2010  . Hypertension associated with diabetes (HCC) 09/28/2010  . Diabetes mellitus (HCC) 09/28/2010  . Allergic rhinitis 09/28/2010  . Hyperlipidemia associated with type 2 diabetes mellitus (HCC) 09/28/2010  . History of migraine headaches 09/28/2010  . Family history of heart disease in female family member before age 865 09/28/2010    Past Surgical History:  Procedure Laterality Date  . TEAR DUCT PROBING     Lorre NickSheane Taylor surgeon    OB History    No data available       Home Medications    Prior to Admission medications   Medication Sig Start Date End Date Taking? Authorizing  Provider  aspirin 81 MG tablet Take 81 mg by mouth daily.     Yes [provider]  CRESTOR 20 MG tablet TAKE 1 TABLET EVERY DAY 03/15/14  Yes Ronnald NianLalonde, John C, MD  lisinopril-hydrochlorothiazide (PRINZIDE,ZESTORETIC) 20-12.5 MG tablet TAKE 1 TABLET BY MOUTH DAILY. 11/19/16  Yes Ronnald NianLalonde, John C, MD  ONE TOUCH ULTRA TEST test strip TEST 3 TIMES A DAY 01/09/16   Ronnald NianLalonde, John C, MD  [facility] DEL[facility] LANCETS 33G MISC USE AS DIRECTED 3 TIMES A DAY 05/10/14   Ronnald NianLalonde, John C, MD    Family History Family History  Problem Relation Age of Onset  . Arthritis Mother   . Hypertension Mother   . Cancer Mother        Lung cancer  . Heart disease Father        Died age 659  . Hypertension Father   . Arthritis Sister   . Hypertension Sister   . Arthritis Brother   . COPD Maternal Grandmother   . Arthritis Maternal Grandmother   . Colon cancer Neg Hx     Social History Social History  Substance Use Topics  . Smoking status: Never Smoker  . Smokeless tobacco: Never Used  . Alcohol use No     Allergies   Grapeseed extract [nutritional supplements]   Review of Systems  Review of Systems  All other systems reviewed and are negative.    Physical Exam Triage Vital Signs ED Triage Vitals [12/19/16 1038]  Enc Vitals Group     BP 119/86     Pulse Rate 96     Resp 20     Temp 98 F (36.7 C)     Temp Source Oral     SpO2 96 %     Weight      Height      Head Circumference      Peak Flow      Pain Score      Pain Loc      Pain Edu?      Excl. in [place]?    No data found.   Updated Vital Signs BP 119/86 (BP Location: Left Arm)   Pulse 96   Temp 98 F (36.7 C) (Oral)   Resp 20   SpO2 96%   Visual Acuity Right Eye Distance:   Left Eye Distance:   Bilateral Distance:    Right Eye Near:   Left Eye Near:    Bilateral Near:     Physical Exam  Constitutional: She is oriented to person, place, and time. She appears well-developed and well-nourished.  HENT:  Head:  Normocephalic and atraumatic.  Mouth/Throat: Oropharynx is clear and moist.  Eyes: Pupils are equal, round, and reactive to light. Conjunctivae and EOM are normal.  Neck: Normal range of motion. Neck supple.  Cardiovascular: Normal rate and regular rhythm.   Pulmonary/Chest: Effort normal and breath sounds normal.  Abdominal: Soft. Bowel sounds are normal.  Musculoskeletal: Normal range of motion.  Neurological: She is alert and oriented to person, place, and time. No cranial nerve deficit. Coordination normal.  Skin: Skin is warm and dry.     UC Treatments / Results  Labs (all labs ordered are listed, but only abnormal results are displayed) Labs Reviewed - No data to display  EKG  EKG Interpretation None       Radiology No results found.  Procedures Procedures (including critical care time)  Medications Ordered in UC Medications - No data to display   Initial Impression / Assessment and Plan / UC Course  I have reviewed the triage vital signs and the nursing notes.  Pertinent labs & imaging results that were available during my care of the patient were reviewed by me and considered in my medical decision making (see chart for details).  No concern for glaucoma, foreign body, retinal detachment given history and physical. Recommend patient also up with ophthalmology in the next couple weeks or sooner if symptoms recur or worsen  Final Clinical Impressions(s) / UC Diagnoses   Final diagnoses:  Eye irritation    New Prescriptions New Prescriptions   No medications on file      Berton Bon, MD 12/19/16 1136

## 2016-12-19 NOTE — Discharge Instructions (Signed)
Please follow-up with the ophthalmologist in the next few weeks. If you have any worsening of this issue please follow-up sooner.

## 2016-12-19 NOTE — ED Notes (Signed)
20/30  Left    20/30     Right

## 2016-12-19 NOTE — ED Notes (Signed)
Spoke to patient.  No flashing light currently.  Patient noticed flashing lights this morning.  Denies blurry vision, denies black or blank whole in vision.  Patient states she sees clearly with both eyes and no headache.  Patient does say that in the past she has had migraines that started this way.  Spoke to Hayden Rasmussenavid Mabe, NP.  [facility]

## 2017-01-01 ENCOUNTER — Encounter: Payer: Self-pay | Admitting: Family Medicine

## 2017-02-24 ENCOUNTER — Other Ambulatory Visit: Payer: Self-pay | Admitting: Family Medicine

## 2017-02-25 NOTE — Telephone Encounter (Signed)
LM that she is due for appt. Filled for 30 days. Trixie Rude

## 2017-03-26 ENCOUNTER — Other Ambulatory Visit: Payer: Self-pay | Admitting: Family Medicine

## 2017-03-26 NOTE — Telephone Encounter (Signed)
Pt is due for appt. rx denied. LM for pt to CB. Trixie Rude/RLB

## 2017-04-01 ENCOUNTER — Other Ambulatory Visit: Payer: Self-pay | Admitting: Family Medicine

## 2017-04-01 NOTE — Telephone Encounter (Signed)
Needs appt

## 2017-04-01 NOTE — Telephone Encounter (Signed)
rx denied. LM for [name]/RLB

## 2017-04-09 ENCOUNTER — Other Ambulatory Visit: Payer: Self-pay | Admitting: Family Medicine

## 2017-04-16 ENCOUNTER — Ambulatory Visit (INDEPENDENT_AMBULATORY_CARE_PROVIDER_SITE_OTHER): Payer: 59 | Admitting: Family Medicine

## 2017-04-16 ENCOUNTER — Encounter: Payer: Self-pay | Admitting: Family Medicine

## 2017-04-16 VITALS — BP 132/70 | HR 72 | Resp 16 | Wt 259.8 lb

## 2017-04-16 DIAGNOSIS — Z23 Encounter for immunization: Secondary | ICD-10-CM

## 2017-04-16 DIAGNOSIS — I10 Essential (primary) hypertension: Secondary | ICD-10-CM | POA: Diagnosis not present

## 2017-04-16 DIAGNOSIS — E1169 Type 2 diabetes mellitus with other specified complication: Secondary | ICD-10-CM | POA: Diagnosis not present

## 2017-04-16 DIAGNOSIS — E119 Type 2 diabetes mellitus without complications: Secondary | ICD-10-CM | POA: Diagnosis not present

## 2017-04-16 DIAGNOSIS — E785 Hyperlipidemia, unspecified: Secondary | ICD-10-CM | POA: Diagnosis not present

## 2017-04-16 DIAGNOSIS — I152 Hypertension secondary to endocrine disorders: Secondary | ICD-10-CM

## 2017-04-16 DIAGNOSIS — Z8249 Family history of ischemic heart disease and other diseases of the circulatory system: Secondary | ICD-10-CM | POA: Diagnosis not present

## 2017-04-16 DIAGNOSIS — E1159 Type 2 diabetes mellitus with other circulatory complications: Secondary | ICD-10-CM

## 2017-04-16 LAB — LIPID PANEL
CHOL/HDL RATIO: 5.3 (calc) — AB (ref <5.0)
Cholesterol: 227 mg/dL — ABNORMAL HIGH (ref <200)
HDL: 43 mg/dL — AB (ref >50)
LDL CHOLESTEROL (CALC): 155 mg/dL — AB
NON-HDL CHOLESTEROL (CALC): 184 mg/dL — AB (ref <130)
TRIGLYCERIDES: 156 mg/dL — AB (ref <150)

## 2017-04-16 LAB — COMPREHENSIVE METABOLIC PANEL
AG Ratio: 1.3 (calc) (ref 1.0–2.5)
ALT: 21 U/L (ref 6–29)
AST: 19 U/L (ref 10–35)
Albumin: 4.1 g/dL (ref 3.6–5.1)
Alkaline phosphatase (APISO): 58 U/L (ref 33–130)
BUN: 12 mg/dL (ref 7–25)
CO2: 29 mmol/L (ref 20–32)
CREATININE: 0.79 mg/dL (ref 0.50–1.05)
Calcium: 9.7 mg/dL (ref 8.6–10.4)
Chloride: 103 mmol/L (ref 98–110)
Globulin: 3.1 g/dL (calc) (ref 1.9–3.7)
Glucose, Bld: 99 mg/dL (ref 65–99)
Potassium: 4 mmol/L (ref 3.5–5.3)
SODIUM: 139 mmol/L (ref 135–146)
Total Bilirubin: 0.9 mg/dL (ref 0.2–1.2)
Total Protein: 7.2 g/dL (ref 6.1–8.1)

## 2017-04-16 LAB — CBC WITH DIFFERENTIAL/PLATELET
BASOS ABS: 40 {cells}/uL (ref 0–200)
Basophils Relative: 1 %
EOS ABS: 80 {cells}/uL (ref 15–500)
Eosinophils Relative: 2 %
HEMATOCRIT: 39.1 % (ref 35.0–45.0)
Hemoglobin: 13.4 g/dL (ref 11.7–15.5)
LYMPHS ABS: 1556 {cells}/uL (ref 850–3900)
MCH: 30.7 pg (ref 27.0–33.0)
MCHC: 34.3 g/dL (ref 32.0–36.0)
MCV: 89.5 fL (ref 80.0–100.0)
MPV: 10.9 fL (ref 7.5–12.5)
Monocytes Relative: 13.4 %
NEUTROS PCT: 44.7 %
Neutro Abs: 1788 cells/uL (ref 1500–7800)
Platelets: 213 10*3/uL (ref 140–400)
RBC: 4.37 10*6/uL (ref 3.80–5.10)
RDW: 12.9 % (ref 11.0–15.0)
Total Lymphocyte: 38.9 %
WBC: 4 10*3/uL (ref 3.8–10.8)
WBCMIX: 536 {cells}/uL (ref 200–950)

## 2017-04-16 LAB — POCT GLYCOSYLATED HEMOGLOBIN (HGB A1C): Hemoglobin A1C: 7

## 2017-04-16 LAB — POCT UA - MICROALBUMIN
Albumin/Creatinine Ratio, Urine, POC: 2.6
Creatinine, POC: 232.4 mg/dL
MICROALBUMIN (UR) POC: 6.1 mg/L

## 2017-04-16 NOTE — Progress Notes (Signed)
  Subjective:    Patient ID: Alicia Lowery, female    DOB: 12/29/1960, 56 y.o.   MRN: [id number]  Alicia Lowery is a 56 y.o. female who presents for follow-up of Type 2 diabetes mellitus.  Patient is checking home blood sugars.   Home blood sugar records: BGs range between 130  and 140 How often is blood sugars being checked: daily Current symptoms/problems include none and have been unchanged. Daily foot checks: yes   Any foot concerns: no Last eye exam: October 2018 Dr. Shea Evansunn  Exercise: walking daily  She continues on her lisinopril/HCTZ and having no difficulty with that.  Also taking Crestor without a problem.  Presently not on a anti-diabetes medication.  She has no other concerns or complaints.  The following portions of the patient's history were reviewed and updated as appropriate: allergies, current medications, past medical history, past social history and problem list.  ROS as in subjective above.     Objective:    Physical Exam Alert and in no distress.  Foot exam shows normal sensation however pulses were difficult to feel  Lab Review Diabetic Labs Latest Ref Rng & Units 12/29/2015 07/20/2015 03/04/2015 08/31/2014 04/22/2014  HbA1c - 7.1 6.7 6.8 6.9 6.7  Microalbumin mg/L 57.9 - - - 5.9  Micro/Creat Ratio - 46.5 - - - 3.5  Chol 125 - 200 mg/dL - - 409172 - -  HDL >=81>=46 mg/dL - - 19(J42(L) - -  Calc LDL <130 mg/dL - - 478105 - -  Triglycerides <150 mg/dL - - 295126 - -  Creatinine 0.50 - 1.05 mg/dL - - 6.210.81 - -   BP/Weight 12/19/2016 08/23/2016 08/17/2016 12/29/2015 07/20/2015  Systolic BP 119 120 156 122 126  Diastolic BP 86 80 77 78 80  Wt. (Lbs) - 264 - 256 257  BMI - 46.77 - 45.35 45.54   Foot/eye exam completion dates Latest Ref Rng & Units 12/19/2016 03/04/2015  Eye Exam No Retinopathy No Retinopathy -  Foot Form Completion - - Done  A1c is 7.0  Alicia Lowery  reports that  has never smoked. she has never used smokeless tobacco. She reports that she does not drink alcohol or use drugs.    Assessment & Plan:    Type 2 diabetes mellitus without complication, unspecified whether long term insulin use (HCC) - Plan: HgB A1c, CBC with Differential/Platelet, Comprehensive metabolic panel, Lipid panel, POCT UA - Microalbumin  Morbid obesity due to excess calories (HCC)  Hypertension associated with diabetes (HCC) - Plan: CBC with Differential/Platelet, Comprehensive metabolic panel  Hyperlipidemia associated with type 2 diabetes mellitus (HCC) - Plan: Lipid panel  Family history of heart disease in female family member before age 57 - Plan: CBC with Differential/Platelet, Comprehensive metabolic panel, Lipid panel  Need for influenza vaccination - Plan: Flu Vaccine QUAD 6+ mos PF IM (Fluarix Quad PF)    1. Rx changes: none 2. Education: Reviewed 'ABCs' of diabetes management (respective goals in parentheses):  A1C (<7), blood pressure (<130/80), and cholesterol (LDL <100). 3. Compliance at present is estimated to be good. Efforts to improve compliance (if necessary) will be directed at increased exercise.  Recommend that she add walking to her regimen during her morning, lunch and afternoon breaks at work. 4. Follow up: 4 months

## 2017-04-17 ENCOUNTER — Other Ambulatory Visit: Payer: Self-pay | Admitting: Family Medicine

## 2017-04-30 ENCOUNTER — Other Ambulatory Visit: Payer: Self-pay

## 2017-04-30 ENCOUNTER — Telehealth: Payer: Self-pay | Admitting: Family Medicine

## 2017-04-30 MED ORDER — GLUCOSE BLOOD VI STRP: ORAL_STRIP | 5 refills | Status: DC

## 2017-04-30 MED ORDER — ACCU-CHEK GUIDE W/DEVICE KIT: 1.0000 | PACK | Freq: Every day | 0 refills | Status: DC

## 2017-04-30 MED ORDER — ACCU-CHEK FASTCLIX LANCETS MISC: 5 refills | Status: AC

## 2017-04-30 NOTE — Telephone Encounter (Signed)
Patient called and states she is not taking Crestor daily.  She will start doing that.

## 2017-04-30 NOTE — Telephone Encounter (Signed)
Change this to what ever her insurance will cover

## 2017-04-30 NOTE — Telephone Encounter (Signed)
  Received fax from [facility]  Accu chek guide strips not covered by insurance  Consider changing if clinically appropriate

## 2017-05-01 ENCOUNTER — Telehealth: Payer: Self-pay | Admitting: Family Medicine

## 2017-05-01 MED ORDER — ROSUVASTATIN CALCIUM 20 MG PO TABS: 20.0000 mg | ORAL_TABLET | Freq: Every day | ORAL | 1 refills | Status: DC

## 2017-05-01 MED ORDER — BLOOD GLUCOSE MONITOR KIT: PACK | 0 refills | Status: AC

## 2017-05-01 NOTE — Telephone Encounter (Signed)
Pt called for refills of Crestor. Please send to [facility] [address]

## 2017-05-01 NOTE — Telephone Encounter (Signed)
Ins one touch ultra, verio.

## 2017-05-01 NOTE — Telephone Encounter (Signed)
Ins will cover one touch ultra or verio per pharmacy. Called this in for pt.

## 2017-05-02 ENCOUNTER — Telehealth: Payer: Self-pay

## 2017-05-09 ENCOUNTER — Telehealth: Payer: Self-pay | Admitting: Family Medicine

## 2017-05-09 MED ORDER — LISINOPRIL-HYDROCHLOROTHIAZIDE 20-12.5 MG PO TABS: 1.0000 | ORAL_TABLET | Freq: Every day | ORAL | 0 refills | Status: DC

## 2017-05-09 NOTE — Telephone Encounter (Signed)
Rcvd refill request for Lisinopril 20-12.5 mg #90 °

## 2017-05-09 NOTE — Telephone Encounter (Signed)
rx called in. /RLB  

## 2017-05-29 NOTE — Telephone Encounter (Signed)
Done

## 2017-08-23 ENCOUNTER — Other Ambulatory Visit: Payer: Self-pay | Admitting: Family Medicine

## 2017-11-10 ENCOUNTER — Other Ambulatory Visit: Payer: Self-pay | Admitting: Family Medicine

## 2017-11-14 ENCOUNTER — Other Ambulatory Visit: Payer: Self-pay | Admitting: Family Medicine

## 2017-12-16 ENCOUNTER — Ambulatory Visit (INDEPENDENT_AMBULATORY_CARE_PROVIDER_SITE_OTHER): Payer: 59

## 2017-12-16 ENCOUNTER — Ambulatory Visit (HOSPITAL_COMMUNITY)
Admission: EM | Admit: 2017-12-16 | Discharge: 2017-12-16 | Disposition: A | Discharge status: Home / Self Care | Payer: 59 | Attending: Family Medicine | Admitting: Family Medicine

## 2017-12-16 ENCOUNTER — Encounter (HOSPITAL_COMMUNITY): Payer: Self-pay

## 2017-12-16 DIAGNOSIS — M7521 Bicipital tendinitis, right shoulder: Secondary | ICD-10-CM

## 2017-12-16 MED ORDER — MELOXICAM 15 MG PO TABS: 15.0000 mg | ORAL_TABLET | Freq: Every day | ORAL | 1 refills | Status: DC

## 2017-12-16 NOTE — ED Triage Notes (Signed)
Pt presents with right arm pain from unknown source

## 2017-12-16 NOTE — ED Provider Notes (Signed)
[place]    CSN: [id number] Arrival date & time: 12/16/17  1833     History   Chief Complaint Chief Complaint  Patient presents with  . Right Arm Pain    HPI Alicia Lowery is a 56 y.o. female.   Presents with right arm pain with no known injury.  Pain seems to localize in the upper arm humerus area.  She denies any ongoing issues with her neck.  She does no repetitive activities with this arm.  HPI  Past Medical History:  Diagnosis Date  . Allergy   . Cardiovascular disease    Family History  . Diabetes mellitus   . Dyslipidemia   . Hemorrhoids   . History of migraine headaches   . Hypertension   . Obesity     Patient Active Problem List   Diagnosis Date Noted  . Obesity, morbid ([place]) 09/28/2010  . Hypertension associated with diabetes ([place]) 09/28/2010  . Diabetes mellitus ([place]) 09/28/2010  . Allergic rhinitis 09/28/2010  . Hyperlipidemia associated with type 2 diabetes mellitus ([place]) 09/28/2010  . History of migraine headaches 09/28/2010  . Family history of heart disease in female family member before age 44 09/28/2010    Past Surgical History:  Procedure Laterality Date  . TEAR DUCT PROBING     Alex Gardener surgeon    OB History   None      Home Medications    Prior to Admission medications   Medication Sig Start Date End Date Taking? Authorizing Provider  ACCU-CHEK FASTCLIX LANCETS MISC Use to check blood sugar 3 times daily 04/30/17   Denita Lung, MD  aspirin 81 MG tablet Take 81 mg by mouth daily.      [provider]  blood glucose meter kit and supplies KIT Dispense One Touch Ultra or Verio. Test twice a day.  E11.9. 05/01/17   Denita Lung, MD  lisinopril-hydrochlorothiazide (PRINZIDE,ZESTORETIC) 20-12.5 MG tablet TAKE 1 TABLET BY MOUTH EVERY DAY 11/15/17   Denita Lung, MD  rosuvastatin (CRESTOR) 20 MG tablet TAKE 1 TABLET BY MOUTH EVERY DAY 11/11/17   Denita Lung, MD    Family History Family History    Problem Relation Age of Onset  . Arthritis Mother   . Hypertension Mother   . Cancer Mother        Lung cancer  . Heart disease Father        Died age 32  . Hypertension Father   . Arthritis Sister   . Hypertension Sister   . Arthritis Brother   . COPD Maternal Grandmother   . Arthritis Maternal Grandmother   . Colon cancer Neg Hx     Social History Social History   Tobacco Use  . Smoking status: Never Smoker  . Smokeless tobacco: Never Used  Substance Use Topics  . Alcohol use: No  . Drug use: No     Allergies   Grapeseed extract [nutritional supplements]   Review of Systems Review of Systems  Constitutional: Negative for chills and fever.  HENT: Negative for ear pain and sore throat.   Eyes: Negative for pain and visual disturbance.  Respiratory: Negative for cough and shortness of breath.   Cardiovascular: Negative for chest pain and palpitations.  Gastrointestinal: Negative for abdominal pain and vomiting.  Genitourinary: Negative for dysuria and hematuria.  Musculoskeletal: Negative.  Negative for arthralgias and back pain.       Right upper arm pain  Skin: Negative for color change  and rash.  Neurological: Negative for seizures and syncope.  All other systems reviewed and are negative.    Physical Exam Triage Vital Signs ED Triage Vitals  Enc Vitals Group     BP 12/16/17 1919 (!) 112/47     Pulse Rate 12/16/17 1919 75     Resp 12/16/17 1919 20     Temp 12/16/17 1919 98.2 F (36.8 C)     Temp Source 12/16/17 1919 Oral     SpO2 12/16/17 1919 97 %     Weight --      Height --      Head Circumference --      Peak Flow --      Pain Score 12/16/17 1918 5     Pain Loc --      Pain Edu? --      Excl. in [place]? --    No data found.  Updated Vital Signs BP (!) 112/47 (BP Location: Left Arm)   Pulse 75   Temp 98.2 F (36.8 C) (Oral)   Resp 20   SpO2 97%   Visual Acuity Right Eye Distance:   Left Eye Distance:   Bilateral Distance:    Right  Eye Near:   Left Eye Near:    Bilateral Near:     Physical Exam  Constitutional: She appears well-developed and well-nourished. No distress.  HENT:  Head: Normocephalic and atraumatic.  Eyes: Conjunctivae are normal.  Neck: Neck supple.  Cardiovascular: Normal rate and regular rhythm.  No murmur heard. Pulmonary/Chest: Effort normal and breath sounds normal. No respiratory distress.  Abdominal: Soft. There is no tenderness.  Musculoskeletal: She exhibits no edema.  Right arm.  Normal range of motion normal extension flexion and elbow and wrist.  No pain with grip or dorsiflex.  There is some tenderness over the bicep muscle as well as the bicipital tendon distally.  Neurological: She is alert.  Skin: Skin is warm and dry.  Psychiatric: She has a normal mood and affect.  Nursing note and vitals reviewed.    UC Treatments / Results  Labs (all labs ordered are listed, but only abnormal results are displayed) Labs Reviewed - No data to display  EKG None  Radiology No results found.  Procedures Procedures (including critical care time)  Medications Ordered in UC Medications - No data to display  Initial Impression / Assessment and Plan / UC Course  I have reviewed the triage vital signs and the nursing notes.  Pertinent labs & imaging results that were available during my care of the patient were reviewed by me and considered in my medical decision making (see chart for details).     Biceps strain and possible bicipital tendinitis secondary to trauma Final Clinical Impressions(s) / UC Diagnoses   Final diagnoses:  None   Discharge Instructions   None    ED Prescriptions    None     Controlled Substance Prescriptions [place] Controlled Substance Registry consulted? No   Wardell Honour, MD 12/16/17 2050

## 2018-01-12 ENCOUNTER — Emergency Department (HOSPITAL_COMMUNITY)
Admission: EM | Admit: 2018-01-12 | Discharge: 2018-01-12 | Disposition: A | Discharge status: Home / Self Care | Payer: 59 | Attending: Emergency Medicine | Admitting: Emergency Medicine

## 2018-01-12 ENCOUNTER — Other Ambulatory Visit: Payer: Self-pay

## 2018-01-12 ENCOUNTER — Encounter (HOSPITAL_COMMUNITY): Payer: Self-pay | Admitting: Emergency Medicine

## 2018-01-12 DIAGNOSIS — B029 Zoster without complications: Secondary | ICD-10-CM | POA: Insufficient documentation

## 2018-01-12 DIAGNOSIS — E119 Type 2 diabetes mellitus without complications: Secondary | ICD-10-CM | POA: Insufficient documentation

## 2018-01-12 DIAGNOSIS — Z7982 Long term (current) use of aspirin: Secondary | ICD-10-CM | POA: Diagnosis not present

## 2018-01-12 DIAGNOSIS — I10 Essential (primary) hypertension: Secondary | ICD-10-CM | POA: Diagnosis not present

## 2018-01-12 DIAGNOSIS — R21 Rash and other nonspecific skin eruption: Secondary | ICD-10-CM | POA: Diagnosis present

## 2018-01-12 DIAGNOSIS — Z79899 Other long term (current) drug therapy: Secondary | ICD-10-CM | POA: Diagnosis not present

## 2018-01-12 MED ORDER — VALACYCLOVIR HCL 1 G PO TABS: 1000.0000 mg | ORAL_TABLET | Freq: Three times a day (TID) | ORAL | 0 refills | Status: DC

## 2018-01-12 MED ORDER — HYDROCODONE-ACETAMINOPHEN 5-325 MG PO TABS: 1.0000 | ORAL_TABLET | Freq: Four times a day (QID) | ORAL | 0 refills | Status: DC | PRN

## 2018-01-12 NOTE — ED Provider Notes (Signed)
[place] EMERGENCY DEPARTMENT Provider Note   CSN: [id number] Arrival date & time: 01/12/18  0620     History   Chief Complaint Chief Complaint  Patient presents with  . Herpes Zoster    HPI Alicia Lowery is a 58 y.o. female.  HPI Patient presents to the emergency department with a rash on the left side of her neck and shoulder pain into the upper arm the last 2 and half days.  The patient states that the area is very painful and has a burning sensation to it.  She states that she noticed pain in the area and then the next day noticed a rash.  Patient states she did use some aloe vera on the area without significant relief.  States she did not take any other medications prior to arrival.  Patient denies any other symptoms at this time.  She denies headache, blurred vision, nausea, vomiting, weakness, fever dizziness or syncope. Past Medical History:  Diagnosis Date  . Allergy   . Cardiovascular disease    Family History  . Diabetes mellitus   . Dyslipidemia   . Hemorrhoids   . History of migraine headaches   . Hypertension   . Obesity     Patient Active Problem List   Diagnosis Date Noted  . Obesity, morbid ([place]) 09/28/2010  . Hypertension associated with diabetes ([place]) 09/28/2010  . Diabetes mellitus ([place]) 09/28/2010  . Allergic rhinitis 09/28/2010  . Hyperlipidemia associated with type 2 diabetes mellitus ([place]) 09/28/2010  . History of migraine headaches 09/28/2010  . Family history of heart disease in female family member before age 11 09/28/2010    Past Surgical History:  Procedure Laterality Date  . TEAR DUCT PROBING     Alex Gardener surgeon     OB History   None      Home Medications    Prior to Admission medications   Medication Sig Start Date End Date Taking? Authorizing Provider  ACCU-CHEK FASTCLIX LANCETS MISC Use to check blood sugar 3 times daily 04/30/17   Denita Lung, MD  aspirin 81 MG tablet Take 81 mg by mouth  daily.      [provider]  blood glucose meter kit and supplies KIT Dispense One Touch Ultra or Verio. Test twice a day.  E11.9. 05/01/17   Denita Lung, MD  lisinopril-hydrochlorothiazide (PRINZIDE,ZESTORETIC) 20-12.5 MG tablet TAKE 1 TABLET BY MOUTH EVERY DAY 11/15/17   Denita Lung, MD  meloxicam (MOBIC) 15 MG tablet Take 1 tablet (15 mg total) by mouth daily. 12/16/17   Wardell Honour, MD  rosuvastatin (CRESTOR) 20 MG tablet TAKE 1 TABLET BY MOUTH EVERY DAY 11/11/17   Denita Lung, MD    Family History Family History  Problem Relation Age of Onset  . Arthritis Mother   . Hypertension Mother   . Cancer Mother        Lung cancer  . Heart disease Father        Died age 73  . Hypertension Father   . Arthritis Sister   . Hypertension Sister   . Arthritis Brother   . COPD Maternal Grandmother   . Arthritis Maternal Grandmother   . Colon cancer Neg Hx     Social History Social History   Tobacco Use  . Smoking status: Never Smoker  . Smokeless tobacco: Never Used  Substance Use Topics  . Alcohol use: No  . Drug use: No     Allergies  Grapeseed extract [nutritional supplements]   Review of Systems Review of Systems All other systems negative except as documented in the HPI. All pertinent positives and negatives as reviewed in the HPI.  Physical Exam Updated Vital Signs BP (!) 146/92   Pulse 98   Temp 97.8 F (36.6 C) (Oral)   SpO2 95%   Physical Exam  Constitutional: She is oriented to person, place, and time. She appears well-developed and well-nourished. No distress.  HENT:  Head: Normocephalic and atraumatic.  Eyes: Pupils are equal, round, and reactive to light.  Pulmonary/Chest: Effort normal.  Neurological: She is alert and oriented to person, place, and time.  Skin: Skin is warm and dry. Rash noted.     Psychiatric: She has a normal mood and affect.  Nursing note and vitals reviewed.    ED Treatments / Results  Labs (all labs  ordered are listed, but only abnormal results are displayed) Labs Reviewed - No data to display  EKG None  Radiology No results found.  Procedures Procedures (including critical care time)  Medications Ordered in ED Medications - No data to display   Initial Impression / Assessment and Plan / ED Course  I have reviewed the triage vital signs and the nursing notes.  Pertinent labs & imaging results that were available during my care of the patient were reviewed by me and considered in my medical decision making (see chart for details).     Patient be treated with Valtrex 1000 mg 3 times daily for 7 days.  Have advised her to follow-up with her primary doctor.  Told to return here as needed.  Final Clinical Impressions(s) / ED Diagnoses   Final diagnoses:  None    ED Discharge Orders    None       Dalia Heading, PA-C 01/12/18 [place], Vaz[place], DO 01/12/18 [phone]

## 2018-01-12 NOTE — ED Notes (Signed)
Patient verbalizes understanding of discharge instructions. Opportunity for questioning and answers were provided. Pt discharged from ED. 

## 2018-01-12 NOTE — ED Triage Notes (Signed)
Pt states she began having a "rash" on her neck, shoulder, and arm about 3 days ago.  Raised reddened vesicles are noted to pt's left neck, shoulder, and around to front of left arm. Has taken a benadryl at home

## 2018-01-12 NOTE — Discharge Instructions (Addendum)
Return here as needed.  Follow-up with your primary doctor for recheck. °

## 2018-01-14 ENCOUNTER — Ambulatory Visit (INDEPENDENT_AMBULATORY_CARE_PROVIDER_SITE_OTHER): Payer: 59 | Admitting: Family Medicine

## 2018-01-14 ENCOUNTER — Encounter: Payer: Self-pay | Admitting: Family Medicine

## 2018-01-14 VITALS — BP 146/86 | HR 58 | Temp 98.0°F | Wt 265.2 lb

## 2018-01-14 DIAGNOSIS — B029 Zoster without complications: Secondary | ICD-10-CM

## 2018-01-14 NOTE — Progress Notes (Signed)
   Subjective:    Patient ID: Alicia Lowery, female    DOB: 01/20/1961, 57 y.o.   MRN: [id number]  HPI She was seen in the emergency room on September 1 for evaluation of a rash.  She was treated for shingles with Valtrex she is here for follow-up.   Review of Systems     Objective:   Physical Exam Alert and complaining of left upper shoulder pain.  Erythematous lesions with several very small vesicles are noted, most of the lesions have dried up in the C4 left nerve root distribution.       Assessment & Plan:  Herpes zoster without complication She will continue on her Valtrex until is gone.  She does have codeine.  Recommend 800 mg 3 times daily and ibuprofen.  Also discussed the possibility of PHN.

## 2018-01-14 NOTE — Patient Instructions (Addendum)
You can take 800 mg of ibuprofen 3 times per dayPostherpetic Neuralgia Postherpetic neuralgia (PHN) is nerve pain that occurs after a shingles infection. Shingles is a painful rash that appears on one side of the body, usually on your trunk or face. Shingles is caused by the varicella-zoster virus. This is the same virus that causes chickenpox. In people who have had chickenpox, the virus can resurface years later and cause shingles. You may have PHN if you continue to have pain for 3 months after your shingles rash has gone away. PHN appears in the same area where you had the shingles rash. For most people, PHN goes away within 1 year. Getting a vaccination for shingles can prevent PHN. This vaccine is recommended for people older than 50. It may prevent shingles and may also lower your risk of PHN if you do get shingles. What are the causes? PHN is caused by damage to your nerves from the varicella-zoster virus. This damage makes your nerves overly sensitive. What increases the risk? Aging is the biggest risk factor for developing PHN. Most people who get PHN are older than 60. Other risk factors include:  Having very bad pain before your shingles rash starts.  Having a very bad rash.  Having shingles in the nerve that supplies your face and eye (trigeminal nerve).  What are the signs or symptoms? Pain is the main symptom of PHN. The pain is often very bad and may be described as stabbing, burning, or feeling like an electric shock. The pain may come and go or may be there all the time. Pain may be triggered by light touches on the skin or changes in temperature. You may have itching along with the pain. How is this diagnosed? Your health care provider may diagnose PHN based on your symptoms and your history of shingles. Lab studies and other diagnostic tests are usually not needed. How is this treated? There is no cure for PHN. Treatment for PHN will focus on pain relief. Over-the-counter pain  relievers do not usually relieve PHN pain. You may need to work with a pain specialist. Treatment may include:  Antidepressant medicines to help with pain and improve sleep.  Antiseizure medicines to relieve nerve pain.  Strong pain relievers (opioids).  A numbing patch worn on the skin (lidocaine patch).  Follow these instructions at home: It may take a long time to recover from PHN. Work closely with your health care provider, and have a good support system at home.  Take all medicines as directed by your health care provider.  Wear loose, comfortable clothing.  Cover sensitive areas with a dressing to reduce friction from clothing rubbing on the area.  If cold does not make your pain worse, try applying a cool compress or cooling gel pack to the area.  Talk to your health care provider if you feel depressed or desperate. Living with long-term pain can be depressing.  Contact a health care provider if:  Your medicine is not helping.  You are struggling to manage your pain at home. This information is not intended to replace advice given to you by your health care provider. Make sure you discuss any questions you have with your health care provider. Document Released: 07/21/2002 Document Revised: 10/06/2015 Document Reviewed: 04/21/2013 Elsevier Interactive Patient Education  [employer].

## 2018-02-08 ENCOUNTER — Other Ambulatory Visit: Payer: Self-pay | Admitting: Family Medicine

## 2018-04-25 ENCOUNTER — Other Ambulatory Visit: Payer: Self-pay | Admitting: Family Medicine

## 2018-04-28 ENCOUNTER — Ambulatory Visit (INDEPENDENT_AMBULATORY_CARE_PROVIDER_SITE_OTHER): Payer: 59 | Admitting: Family Medicine

## 2018-04-28 ENCOUNTER — Encounter: Payer: Self-pay | Admitting: Family Medicine

## 2018-04-28 VITALS — BP 122/82 | HR 76 | Temp 98.0°F | Resp 16 | Wt 269.0 lb

## 2018-04-28 DIAGNOSIS — Z23 Encounter for immunization: Secondary | ICD-10-CM

## 2018-04-28 DIAGNOSIS — H1031 Unspecified acute conjunctivitis, right eye: Secondary | ICD-10-CM | POA: Diagnosis not present

## 2018-04-28 MED ORDER — ERYTHROMYCIN 5 MG/GM OP OINT: 1.0000 "application " | TOPICAL_OINTMENT | Freq: Three times a day (TID) | OPHTHALMIC | 0 refills | Status: DC

## 2018-04-28 NOTE — Progress Notes (Signed)
Chief Complaint  Patient presents with  . pink eye    irriated.  red, goupy, risning eye good. doing better today    Subjective:  Alicia Lowery is a 57 y.o. female who presents for a 3 day history of right eye redness, drainage, and irritation. No eye pain, vision changes or foreign body sensation. Recently exposed to someone with "pink eye".   Denies fever, chills, headache, rhinorrhea, nasal congestion, ear pain, sore throat.   Treatment to date: none.  No other aggravating or relieving factors.  No other c/o.  ROS as in subjective.   Objective: Vitals:   04/28/18 0819  BP: 122/82  Pulse: 76  Resp: 16  Temp: 98 F (36.7 C)  SpO2: 98%    General appearance: Alert, WD/WN, no distress, well appearing                             Skin: warm, no rash                           Head: no sinus tenderness                            Eyes: left conjunctiva normal, right conjunctiva with mild injection, no drainage visible, corneas clear, PERRLA                            Ears: pearly TMs, external ear canals normal                          Nose: septum midline, turbinates swollen, with erythema and clear discharge             Mouth/throat: MMM, tongue normal, normal OP exam.                            Neck: supple, no adenopathy, no thyromegaly, nontender                              Assessment: Acute conjunctivitis of right eye, unspecified acute conjunctivitis type - Plan: erythromycin ophthalmic ointment  Needs flu shot - Plan: Flu Vaccine QUAD 36+ mos IM    Plan:Discussed diagnosis and treatment of acute conjunctivitis. Romycin prescribed. Use warm or cool compresses. Good hygiene and hand washing discussed.  Does not wear contact lens or eye make up. Follow up if not improving or if symptoms worsen.   Flu shot given.

## 2018-04-28 NOTE — Patient Instructions (Addendum)
Use the topical erythromycin 3 times per day for one week.  You should continue to improve and would need to be seen if your symptoms are worsening.   Bacterial Conjunctivitis Bacterial conjunctivitis is an infection of your conjunctiva. This is the clear membrane that covers the white part of your eye and the inner surface of your eyelid. This condition can make your eye:  Red or pink.  Itchy.  This condition is caused by bacteria. This condition spreads very easily from person to person (is contagious) and from one eye to the other eye. Follow these instructions at home: Medicines  Take or apply your antibiotic medicine as told by your doctor. Do not stop taking or applying the antibiotic even if you start to feel better.  Take or apply over-the-counter and prescription medicines only as told by your doctor.  Do not touch your eyelid with the eye drop bottle or the ointment tube. Managing discomfort  Wipe any fluid from your eye with a warm, wet washcloth or a cotton ball.  Place a cool, clean washcloth on your eye. Do this for 10-20 minutes, 3-4 times per day. General instructions  Do not wear contact lenses until the irritation is gone. Wear glasses until your doctor says it is okay to wear contacts.  Do not wear eye makeup until your symptoms are gone. Throw away any old makeup.  Change or wash your pillowcase every day.  Do not share towels or washcloths with anyone.  Wash your hands often with soap and water. Use paper towels to dry your hands.  Do not touch or rub your eyes.  Do not drive or use heavy machinery if your vision is blurry. Contact a doctor if:  You have a fever.  Your symptoms do not get better after 10 days. Get help right away if:  You have a fever and your symptoms suddenly get worse.  You have very bad pain when you move your eye.  Your face: ? Hurts. ? Is red. ? Is swollen.  You have sudden loss of vision. This information is not  intended to replace advice given to you by your health care provider. Make sure you discuss any questions you have with your health care provider. Document Released: 02/07/2008 Document Revised: 10/06/2015 Document Reviewed: 02/10/2015 Elsevier Interactive Patient Education  [employer]2018 [employer] Inc.

## 2018-05-06 ENCOUNTER — Other Ambulatory Visit: Payer: Self-pay | Admitting: Family Medicine

## 2018-05-26 ENCOUNTER — Telehealth: Payer: Self-pay

## 2018-05-26 ENCOUNTER — Other Ambulatory Visit: Payer: Self-pay | Admitting: Family Medicine

## 2018-05-26 NOTE — Telephone Encounter (Signed)
This was sent to me in error.

## 2018-05-26 NOTE — Telephone Encounter (Signed)
Pt was called to advise her lisinopril was sent into [facility]. [facility]

## 2018-05-26 NOTE — Telephone Encounter (Signed)
Pt left message that she lost her Lisinopril and needs refill sent to pharmacy

## 2018-05-27 NOTE — Telephone Encounter (Signed)
I believe she is your patient. Sent to me accidentally.

## 2018-06-11 ENCOUNTER — Ambulatory Visit (INDEPENDENT_AMBULATORY_CARE_PROVIDER_SITE_OTHER): Payer: 59 | Admitting: Family Medicine

## 2018-06-11 ENCOUNTER — Encounter: Payer: Self-pay | Admitting: Family Medicine

## 2018-06-11 VITALS — BP 124/80 | HR 84 | Temp 98.1°F | Wt 265.2 lb

## 2018-06-11 DIAGNOSIS — E1169 Type 2 diabetes mellitus with other specified complication: Secondary | ICD-10-CM

## 2018-06-11 DIAGNOSIS — I10 Essential (primary) hypertension: Secondary | ICD-10-CM

## 2018-06-11 DIAGNOSIS — E119 Type 2 diabetes mellitus without complications: Secondary | ICD-10-CM

## 2018-06-11 DIAGNOSIS — Z8669 Personal history of other diseases of the nervous system and sense organs: Secondary | ICD-10-CM | POA: Diagnosis not present

## 2018-06-11 DIAGNOSIS — Z79899 Other long term (current) drug therapy: Secondary | ICD-10-CM

## 2018-06-11 DIAGNOSIS — Z23 Encounter for immunization: Secondary | ICD-10-CM

## 2018-06-11 DIAGNOSIS — I152 Hypertension secondary to endocrine disorders: Secondary | ICD-10-CM

## 2018-06-11 DIAGNOSIS — Z8249 Family history of ischemic heart disease and other diseases of the circulatory system: Secondary | ICD-10-CM

## 2018-06-11 DIAGNOSIS — Z9119 Patient's noncompliance with other medical treatment and regimen: Secondary | ICD-10-CM

## 2018-06-11 DIAGNOSIS — E1159 Type 2 diabetes mellitus with other circulatory complications: Secondary | ICD-10-CM

## 2018-06-11 DIAGNOSIS — E785 Hyperlipidemia, unspecified: Secondary | ICD-10-CM

## 2018-06-11 DIAGNOSIS — Z91199 Patient's noncompliance with other medical treatment and regimen due to unspecified reason: Secondary | ICD-10-CM

## 2018-06-11 LAB — COMPREHENSIVE METABOLIC PANEL
A/G RATIO: 1.6 (ref 1.2–2.2)
ALBUMIN: 4.3 g/dL (ref 3.8–4.9)
ALK PHOS: 71 IU/L (ref 39–117)
ALT: 39 IU/L — ABNORMAL HIGH (ref 0–32)
AST: 30 IU/L (ref 0–40)
BILIRUBIN TOTAL: 0.7 mg/dL (ref 0.0–1.2)
BUN/Creatinine Ratio: 13 (ref 9–23)
BUN: 9 mg/dL (ref 6–24)
CO2: 21 mmol/L (ref 20–29)
CREATININE: 0.72 mg/dL (ref 0.57–1.00)
Calcium: 9.3 mg/dL (ref 8.7–10.2)
Chloride: 101 mmol/L (ref 96–106)
GFR calc Af Amer: 107 mL/min/{1.73_m2} (ref >59)
GFR calc non Af Amer: 93 mL/min/{1.73_m2} (ref >59)
Globulin, Total: 2.7 g/dL (ref 1.5–4.5)
Glucose: 195 mg/dL — ABNORMAL HIGH (ref 65–99)
Potassium: 4.2 mmol/L (ref 3.5–5.2)
SODIUM: 138 mmol/L (ref 134–144)
Total Protein: 7 g/dL (ref 6.0–8.5)

## 2018-06-11 LAB — CBC WITH DIFFERENTIAL/PLATELET
Basophils Absolute: 0 10*3/uL (ref 0.0–0.2)
Basos: 1 %
EOS (ABSOLUTE): 0.2 10*3/uL (ref 0.0–0.4)
EOS: 4 %
HEMATOCRIT: 40 % (ref 34.0–46.6)
HEMOGLOBIN: 13.6 g/dL (ref 11.1–15.9)
Immature Grans (Abs): 0 10*3/uL (ref 0.0–0.1)
Immature Granulocytes: 0 %
LYMPHS ABS: 1.4 10*3/uL (ref 0.7–3.1)
Lymphs: 32 %
MCH: 30.5 pg (ref 26.6–33.0)
MCHC: 34 g/dL (ref 31.5–35.7)
MCV: 90 fL (ref 79–97)
MONOCYTES: 13 %
Monocytes Absolute: 0.6 10*3/uL (ref 0.1–0.9)
Neutrophils Absolute: 2.2 10*3/uL (ref 1.4–7.0)
Neutrophils: 50 %
Platelets: 244 10*3/uL (ref 150–450)
RBC: 4.46 x10E6/uL (ref 3.77–5.28)
RDW: 12.7 % (ref 11.7–15.4)
WBC: 4.3 10*3/uL (ref 3.4–10.8)

## 2018-06-11 LAB — LIPID PANEL
CHOLESTEROL TOTAL: 151 mg/dL (ref 100–199)
Chol/HDL Ratio: 4.3 ratio (ref 0.0–4.4)
HDL: 35 mg/dL — ABNORMAL LOW (ref >39)
LDL CALC: 87 mg/dL (ref 0–99)
Triglycerides: 144 mg/dL (ref 0–149)
VLDL Cholesterol Cal: 29 mg/dL (ref 5–40)

## 2018-06-11 LAB — POCT GLYCOSYLATED HEMOGLOBIN (HGB A1C): HEMOGLOBIN A1C: 9.2 % — AB (ref 4.0–5.6)

## 2018-06-11 MED ORDER — EXENATIDE ER 2 MG/0.85ML ~~LOC~~ AUIJ
1.0000 | AUTO-INJECTOR | SUBCUTANEOUS | 1 refills | Status: DC
Start: 2018-06-11 — End: 2018-06-16

## 2018-06-11 MED ORDER — METFORMIN HCL 500 MG PO TABS: 500.0000 mg | ORAL_TABLET | Freq: Two times a day (BID) | ORAL | 3 refills | Status: DC

## 2018-06-11 MED ORDER — LISINOPRIL-HYDROCHLOROTHIAZIDE 20-12.5 MG PO TABS: 1.0000 | ORAL_TABLET | Freq: Every day | ORAL | 0 refills | Status: DC

## 2018-06-11 MED ORDER — ROSUVASTATIN CALCIUM 20 MG PO TABS: 20.0000 mg | ORAL_TABLET | Freq: Every day | ORAL | 3 refills | Status: DC

## 2018-06-11 NOTE — Progress Notes (Signed)
Subjective:    Patient ID: Alicia Lowery, female    DOB: 07/06/1960, 58 y.o.   MRN: [id number]  Alicia Lowery is a 58 y.o. female who presents for follow-up of Type 2 diabetes mellitus.  Patient is checking home blood sugars.   Home blood sugar records: meter records How often is blood sugars being checked: bid post meal 210 avg Current symptoms/problems include none at this time. Daily foot checks: yes  Any foot concerns: none Last eye exam: mid 2019 Exercise: Minimal  walking   She has not followed up here in quite some time.  She has been reluctant to go on any diabetes medications.  She has been through the diabetes education readily admits that she has not been following the recommendations.  She continues on lisinopril/HCTZ as well as Crestor but has had no difficulties with that. The following portions of the patient's history were reviewed and updated as appropriate: allergies, current medications, past medical history, past social history and problem list.  ROS as in subjective above.     Objective:    Physical Exam Alert and in no distress.  Foot exam is normal  Hemoglobin A1c is 9.2 Blood pressure 124/80, pulse 84, temperature 98.1 F (36.7 C), weight 265 lb 3.2 oz (120.3 kg), SpO2 95 %.  Lab Review Diabetic Labs Latest Ref Rng & Units 06/11/2018 04/16/2017 12/29/2015 07/20/2015 03/04/2015  HbA1c 4.0 - 5.6 % 9.2(A) 7.0 7.1 6.7 6.8  Microalbumin mg/L - 6.1 57.9 - -  Micro/Creat Ratio - - 2.6 46.5 - -  Chol <200 mg/dL - 782(N227(H) - - 562172  HDL >13>50 mg/dL - 08(M43(L) - - 57(Q42(L)  Calc LDL mg/dL (calc) - 469(G155(H) - - 295105  Triglycerides <150 mg/dL - 284(X156(H) - - 324126  Creatinine 0.50 - 1.05 mg/dL - 4.010.79 - - 0.270.81   BP/Weight 06/11/2018 04/28/2018 01/14/2018 01/12/2018 12/16/2017  Systolic BP 124 122 146 132 112  Diastolic BP 80 82 86 92 47  Wt. (Lbs) 265.2 269 265.2 - -  BMI 46.98 47.65 46.98 - -   Foot/eye exam completion dates Latest Ref Rng & Units 04/16/2017 12/19/2016  Eye Exam No Retinopathy  - No Retinopathy  Foot Form Completion - Done -    Alicia Lowery  reports that she has never smoked. She has never used smokeless tobacco. She reports that she does not drink alcohol or use drugs.     Assessment & Plan:    Type 2 diabetes mellitus without complication, unspecified whether long term insulin use (HCC) - Plan: POCT glycosylated hemoglobin (Hb A1C), CBC with Differential/Platelet, Comprehensive metabolic panel, Lipid panel, POCT UA - Microalbumin, Exenatide ER (BYDUREON BCISE) 2 MG/0.85ML AUIJ, metFORMIN (GLUCOPHAGE) 500 MG tablet  Family history of heart disease in female family member before age 265  History of migraine headaches  Hyperlipidemia associated with type 2 diabetes mellitus (HCC) - Plan: Lipid panel, rosuvastatin (CRESTOR) 20 MG tablet  Hypertension associated with diabetes (HCC) - Plan: CBC with Differential/Platelet, Comprehensive metabolic panel, lisinopril-hydrochlorothiazide (PRINZIDE,ZESTORETIC) 20-12.5 MG tablet  Obesity, morbid (HCC) - Plan: CBC with Differential/Platelet, Comprehensive metabolic panel, Lipid panel  Personal history of noncompliance with medical treatment, presenting hazards to health  Encounter for long-term (current) use of medications - Plan: CBC with Differential/Platelet, Comprehensive metabolic panel, Lipid panel  Need for Tdap vaccination - Plan: Tdap vaccine greater than or equal to 7yo IM  1. Rx changes: Bydureon added as well as metformin.  Instructions on how to use the Bydureon was given.  Also discussed possible side effects of metformin. 2. Education: Reviewed 'ABCs' of diabetes management (respective goals in parentheses):  A1C (<7), blood pressure (<130/80), and cholesterol (LDL <100). 3. Compliance at present is estimated to be poor. Efforts to improve compliance (if necessary) will be directed at increased exercise. 4. Follow up: 4 months I discussed the need for her to take better care of herself in regard to diet,  exercise, taking her medications as well as making time during her day to take care of herself or not allow work or anything else to get in the way.  Also discussed the need for her to come in 2-3 times per year.

## 2018-06-11 NOTE — Patient Instructions (Signed)
Is 20 minutes of something physical daily or 150 minutes a week of somethingw The easiest thing in terms of diet is to cut back on carbohydrates and the easiest way to remember that is white food

## 2018-06-13 ENCOUNTER — Telehealth: Payer: Self-pay | Admitting: Family Medicine

## 2018-06-13 NOTE — Telephone Encounter (Signed)
P.A. BYDUREON, preferred alternatives are Ozempic, Trulicity, and Victoza

## 2018-06-15 ENCOUNTER — Other Ambulatory Visit: Payer: Self-pay | Admitting: Family Medicine

## 2018-06-15 DIAGNOSIS — E119 Type 2 diabetes mellitus without complications: Secondary | ICD-10-CM

## 2018-06-16 NOTE — Telephone Encounter (Signed)
Please advise ozempic was denied. KH

## 2018-06-22 NOTE — Telephone Encounter (Signed)
P.A. Louanna Raw denied pt needs trial of all formulary alternatives:  Ozempic, Trulicity, Victoza. Do you want to switch?

## 2018-06-23 NOTE — Telephone Encounter (Signed)
Pt was switched to Ozempic and I called pt and she having issues with dosing and she will come by the office today and kim will help her

## 2018-06-27 ENCOUNTER — Telehealth: Payer: Self-pay | Admitting: Family Medicine

## 2018-06-27 ENCOUNTER — Telehealth: Payer: Self-pay | Admitting: Medical

## 2018-06-27 MED ORDER — SEMAGLUTIDE(0.25 OR 0.5MG/DOS) 2 MG/1.5ML ~~LOC~~ SOPN: 0.2500 mg | PEN_INJECTOR | SUBCUTANEOUS | 0 refills | Status: DC

## 2018-06-27 MED ORDER — SEMAGLUTIDE (1 MG/DOSE) 2 MG/1.5ML ~~LOC~~ SOPN: 1.0000 mg | PEN_INJECTOR | SUBCUTANEOUS | 0 refills | Status: DC

## 2018-06-27 NOTE — Telephone Encounter (Signed)
Corrected dosage

## 2018-06-27 NOTE — Telephone Encounter (Addendum)
Pt states that the Ozempic pen she got from the pharmacy was not the correct pen for her dosage,  called pharmacist and went over dosage and she had gotten the 1 mg pen and should have gotten the starter pen .25/.5 mg.  And the directions were also incorrect that it should be .25mg  increase not 2.5 after 4 weeks then increase by .25mg  which will be dosage of 0.5mg  once weekly .  I changed the Rx and put in the Rx for next month per pharmacists instructions and verified with [facility].   Explained this to pt

## 2018-07-18 ENCOUNTER — Telehealth: Payer: Self-pay | Admitting: Family Medicine

## 2018-07-18 NOTE — Telephone Encounter (Signed)
Records received from [facility]. Sending back for review.

## 2018-07-21 ENCOUNTER — Other Ambulatory Visit: Payer: Self-pay | Admitting: Family Medicine

## 2018-07-22 ENCOUNTER — Telehealth: Payer: Self-pay

## 2018-07-22 NOTE — Telephone Encounter (Signed)
Called pt to ask how she is doing on the ozempic and if she has her follow up appointment with [place] . LVM for pt to call back. KH

## 2018-08-16 ENCOUNTER — Other Ambulatory Visit: Payer: Self-pay | Admitting: Family Medicine

## 2018-09-07 ENCOUNTER — Other Ambulatory Visit: Payer: Self-pay | Admitting: Family Medicine

## 2018-09-08 NOTE — Telephone Encounter (Signed)
LVM for pt to call and advise of meter so strips can be sent in . [facility]

## 2018-09-09 NOTE — Telephone Encounter (Signed)
Pt was called again to confirm strips. LVM KH

## 2018-09-11 ENCOUNTER — Telehealth: Payer: Self-pay

## 2018-09-11 MED ORDER — GLUCOSE BLOOD VI STRP: ORAL_STRIP | 12 refills | Status: DC

## 2018-09-11 NOTE — Telephone Encounter (Signed)
Test strips sent for [facility] glucometer

## 2018-10-08 ENCOUNTER — Telehealth: Payer: Self-pay

## 2018-10-08 NOTE — Telephone Encounter (Signed)
LVM for pt to advise of appt next week. Pt was advised to call office back [facility]

## 2018-10-13 ENCOUNTER — Other Ambulatory Visit: Payer: Self-pay

## 2018-10-13 ENCOUNTER — Ambulatory Visit (INDEPENDENT_AMBULATORY_CARE_PROVIDER_SITE_OTHER): Payer: 59 | Admitting: Family Medicine

## 2018-10-13 ENCOUNTER — Encounter: Payer: Self-pay | Admitting: Family Medicine

## 2018-10-13 VITALS — BP 140/84 | HR 82 | Temp 98.1°F | Wt 253.4 lb

## 2018-10-13 DIAGNOSIS — J301 Allergic rhinitis due to pollen: Secondary | ICD-10-CM | POA: Diagnosis not present

## 2018-10-13 DIAGNOSIS — E119 Type 2 diabetes mellitus without complications: Secondary | ICD-10-CM

## 2018-10-13 DIAGNOSIS — Z1159 Encounter for screening for other viral diseases: Secondary | ICD-10-CM

## 2018-10-13 DIAGNOSIS — R87610 Atypical squamous cells of undetermined significance on cytologic smear of cervix (ASC-US): Secondary | ICD-10-CM | POA: Insufficient documentation

## 2018-10-13 DIAGNOSIS — E785 Hyperlipidemia, unspecified: Secondary | ICD-10-CM

## 2018-10-13 DIAGNOSIS — I10 Essential (primary) hypertension: Secondary | ICD-10-CM

## 2018-10-13 DIAGNOSIS — Z0000 Encounter for general adult medical examination without abnormal findings: Secondary | ICD-10-CM | POA: Diagnosis not present

## 2018-10-13 DIAGNOSIS — I152 Hypertension secondary to endocrine disorders: Secondary | ICD-10-CM

## 2018-10-13 DIAGNOSIS — E1159 Type 2 diabetes mellitus with other circulatory complications: Secondary | ICD-10-CM

## 2018-10-13 DIAGNOSIS — Z79899 Other long term (current) drug therapy: Secondary | ICD-10-CM

## 2018-10-13 DIAGNOSIS — E1169 Type 2 diabetes mellitus with other specified complication: Secondary | ICD-10-CM

## 2018-10-13 DIAGNOSIS — Z8249 Family history of ischemic heart disease and other diseases of the circulatory system: Secondary | ICD-10-CM

## 2018-10-13 HISTORY — DX: Atypical squamous cells of undetermined significance on cytologic smear of cervix (ASC-US): R87.610

## 2018-10-13 LAB — POCT GLYCOSYLATED HEMOGLOBIN (HGB A1C): Hemoglobin A1C: 7.3 % — AB (ref 4.0–5.6)

## 2018-10-13 NOTE — Progress Notes (Signed)
Subjective:    Patient ID: Alicia Lowery, female    DOB: 1960/10/04, 58 y.o.   MRN: [id number]  HPI She is here for complete examination.  She does have underlying diabetes and presently is on Ozempic and metformin.  She is very happy with the results of this and has lost some weight.  She is also walking 20 to 30 minutes every day.  Her blood sugars look quite good.  She does check her feet regularly.  She does note that she needs an eye exam.  She continues on lisinopril/HCTZ and Crestor and is having no difficulty with him.  Recent blood work was reviewed.  She does have underlying seasonal allergies but is doing okay on it.  Family and social history as well as health maintenance and immunizations was reviewed.   Review of Systems  All other systems reviewed and are negative.      Objective:   Physical Exam BP 140/84 (BP Location: Left Arm, Patient Position: Sitting)   Pulse 82   Temp 98.1 F (36.7 C)   Wt 253 lb 6.4 oz (114.9 kg)   SpO2 98%   BMI 44.89 kg/m   General Appearance:    Alert, cooperative, no distress, appears stated age  Head:    Normocephalic, without obvious abnormality, atraumatic  Eyes:    PERRL, conjunctiva/corneas clear, EOM's intact, fundi    benign  Ears:    Normal TM's and external ear canals  Nose:   Nares normal, mucosa normal, no drainage or sinus   tenderness  Throat:   Lips, mucosa, and tongue normal; teeth and gums normal  Neck:   Supple, no lymphadenopathy;  thyroid:  no   enlargement/tenderness/nodules; no carotid   bruit or JVD     Lungs:     Clear to auscultation bilaterally without wheezes, rales or     ronchi; respirations unlabored      Heart:    Regular rate and rhythm, S1 and S2 normal, no murmur, rub   or gallop  Breast Exam:    Deferred to GYN  Abdomen:     Soft, non-tender, nondistended, normoactive bowel sounds,    no masses, no hepatosplenomegaly  Genitalia:    Deferred to GYN     Extremities:   No clubbing, cyanosis or edema   Pulses:   2+ and symmetric all extremities  Skin:   Skin color, texture, turgor normal, no rashes or lesions  Lymph nodes:   Cervical, supraclavicular, and axillary nodes normal  Neurologic:   CNII-XII intact, normal strength, sensation and gait; reflexes 2+ and symmetric throughout          Psych:   Normal mood, affect, hygiene and grooming.   Hemoglobin A1c is 7.3       Assessment & Plan:  Seasonal allergic rhinitis due to pollen  Type 2 diabetes mellitus without complication, unspecified whether long term insulin use (HCC) - Plan: POCT glycosylated hemoglobin (Hb A1C)  Routine general medical examination at a health care facility  Need for hepatitis C screening test - Plan: Hepatitis C antibody  Hypertension associated with diabetes (HCC)  Obesity, morbid (HCC)  Family history of heart disease in female family member before age 61  Hyperlipidemia associated with type 2 diabetes mellitus (HCC)  Encounter for long-term (current) use of medications  She has lost over 10 pounds and I congratulated her on that.  Her hemoglobin A1c is also come down to a good range.  She will continue on her  present medication regimen.  She is to set up an appointment for an eye exam and we will set her up to get a Pap and pelvic.

## 2018-10-14 LAB — HEPATITIS C ANTIBODY: Hep C Virus Ab: <0.1 s/co ratio (ref 0.0–0.9)

## 2018-10-27 ENCOUNTER — Ambulatory Visit: Payer: Self-pay | Admitting: Family Medicine

## 2018-10-31 ENCOUNTER — Ambulatory Visit: Payer: Self-pay | Admitting: Family Medicine

## 2018-10-31 ENCOUNTER — Encounter: Payer: Self-pay | Admitting: Family Medicine

## 2018-10-31 ENCOUNTER — Other Ambulatory Visit: Payer: Self-pay

## 2018-10-31 ENCOUNTER — Ambulatory Visit (INDEPENDENT_AMBULATORY_CARE_PROVIDER_SITE_OTHER): Payer: 59 | Admitting: Family Medicine

## 2018-10-31 ENCOUNTER — Other Ambulatory Visit (HOSPITAL_COMMUNITY)
Admission: RE | Admit: 2018-10-31 | Discharge: 2018-10-31 | Disposition: A | Discharge status: Home / Self Care | Payer: 59 | Source: Ambulatory Visit | Attending: Family Medicine | Admitting: Family Medicine

## 2018-10-31 VITALS — Temp 97.7°F

## 2018-10-31 DIAGNOSIS — Z124 Encounter for screening for malignant neoplasm of cervix: Secondary | ICD-10-CM

## 2018-10-31 NOTE — Patient Instructions (Signed)
Preventing Cervical Cancer  Cervical cancer is cancer that grows on the cervix. The cervix is at the bottom of the uterus. It connects the uterus to the vagina. The uterus is where a baby develops during pregnancy. Cancer occurs when cells become abnormal and start to grow out of control. Cervical cancer grows slowly and may not cause any symptoms at first. Over time, the cancer can grow deep into the cervix tissue and spread to other areas. If it is found early, cervical cancer can be treated effectively. You can also take steps to prevent this type of cancer. Most cases of cervical cancer are caused by an STI (sexually transmitted infection) called human papillomavirus (HPV). One way to reduce your risk of cervical cancer is to avoid infection with the HPV virus. You can do this by practicing safe sex and by getting the HPV vaccine. Getting regular Pap tests is also important because this can help identify changes in cells that could lead to cancer. Your chances of getting this disease can also be reduced by making certain lifestyle changes. How can I protect myself from cervical cancer? Preventing HPV infection  Ask your health care provider about getting the HPV vaccine. If you are 26 years old or younger, you may need to get this vaccine, which is given in three doses over 6 months. This vaccine protects against the types of HPV that could cause cancer.  Limit the number of people you have sex with. Also avoid having sex with people who have had many sex partners.  Use a latex condom during sex. Getting Pap tests  Get Pap tests regularly, starting at age 21. Talk with your health care provider about how often you need these tests. ? Most women who are 21?59 years of age should have a Pap test every 3 years. ? Most women who are 30?59 years of age should have a Pap test in combination with an HPV test every 5 years. ? Women with a higher risk of cervical cancer, such as those with a weakened  immune system or those who have been exposed to the drug diethylstilbestrol (DES), may need more frequent testing. Making other lifestyle changes  Do not use any products that contain nicotine or tobacco, such as cigarettes and e-cigarettes. If you need help quitting, ask your health care provider.  Eat at least 5 servings of fruits and vegetables every day.  Lose weight if you are overweight. Why are these changes important?  These changes and screening tests are designed to address the factors that are known to increase the risk of cervical cancer. Taking these steps is the best way to reduce your risk.  Having regular Pap tests will help identify changes in cells that could lead to cancer. Steps can then be taken to prevent cancer from developing.  These changes will also help find cervical cancer early. This type of cancer can be treated effectively if it is found early. It can be more dangerous and difficult to treat if cancer has grown deep into your cervix or has spread.  In addition to making you less likely to get cervical cancer, these changes will also provide other health benefits, such as the following: ? Practicing safe sex is important for preventing STIs and unplanned pregnancies. ? Avoiding tobacco can reduce your risk for other cancers and health issues. ? Eating a healthy diet and maintaining a healthy weight are good for your overall health. What can happen if changes are not made? In   the early stages, cervical cancer might not have any symptoms. It can take many years for the cancer to grow and get deep into the cervix tissue. This may be happening without you knowing about it. If you develop any symptoms, such as pelvic pain or unusual discharge or bleeding from your vagina, you should see your health care provider right away. If cervical cancer is not found early, you might need treatments such as radiation, chemotherapy, or surgery. In some cases, surgery may mean that  you will not be able to get pregnant or carry a pregnancy to term. Where to find support Talk with your health care provider, school nurse, or local health department for guidance about screening and vaccination. Some children and teens may be able to get the HPV vaccine free of charge through the U.S. government's Vaccines for Children (VFC) program. Other places that provide vaccinations include:  Public health clinics. Check with your local health department.  Federally Qualified Health Centers, where you would pay only what you can afford. To find one near you, check this website: www.fqhc.org/find-an-fqhc/  Rural Health Clinics. These are part of a program for Medicare and Medicaid patients who live in rural areas. The National Breast and Cervical Cancer Early Detection Program also provides breast and cervical cancer screenings and diagnostic services to low-income, uninsured, and underinsured women. Cervical cancer can be passed down through families. Talk with your health care provider or genetic counselor to learn more about genetic testing for cancer. Where to find more information Learn more about cervical cancer from:  American College of Gynecology: www.acog.org/Patients/FAQs/Cervical-Cancer  American Cancer Society: www.cancer.org/cancer/cervicalcancer/  U.S. Centers for Disease Control and Prevention: www.cdc.gov/cancer/cervical/ Summary  Talk with your health care provider about getting the HPV vaccine.  Be sure to get regular Pap tests as recommended by your health care provider.  See your health care provider right away if you have any pelvic pain or unusual discharge or bleeding from your vagina. This information is not intended to replace advice given to you by your health care provider. Make sure you discuss any questions you have with your health care provider. Document Released: 05/15/2015 Document Revised: 12/27/2015 Document Reviewed: 12/27/2015 Elsevier  Interactive Patient Education  2019 [employer].  

## 2018-10-31 NOTE — Progress Notes (Signed)
   Subjective:    Patient ID: Alicia Lowery, female    DOB: Mar 12, 1961, 58 y.o.   MRN: [id number]  HPI Chief Complaint  Patient presents with  . pap smear    had cpe last with with Dr. Redmond School. no other concenrns   She is here today for a Pap smear.  She had her CPE last week with Dr. Redmond School and declined having him do the Pap smear.  Denies having any symptoms. Declines STD testing.  LMP: October 2019   Last pap smear: years ago   Denies fever, chills, abdominal pain, back pain, urinary symptoms, vaginal discharge or dyspareunia.   Review of Systems Pertinent positives and negatives in the history of present illness.     Objective:   Physical Exam Exam conducted with a chaperone present.  Constitutional:      Appearance: Normal appearance.  Genitourinary:    Labia:        Right: No rash, tenderness or lesion.        Left: No rash, tenderness or lesion.      Cervix: No cervical motion tenderness, discharge or lesion.     Uterus: Not tender.      Adnexa:        Right: No mass or tenderness.         Left: No mass or tenderness.    Neurological:     Mental Status: She is alert.    Temp 97.7 F (36.5 C)       Assessment & Plan:  Encounter for Papanicolaou smear of cervix - Plan: pap smear done, chaperone present, she tolerated this well. In her usual state of health.   Screening for cervical cancer - Plan: pap smear done, chaperone present. Declines STD testing. Follow up pending results.

## 2018-11-07 ENCOUNTER — Encounter: Payer: Self-pay | Admitting: Family Medicine

## 2018-11-07 LAB — CYTOLOGY - PAP
Diagnosis: UNDETERMINED — AB
HPV: NOT DETECTED

## 2018-11-13 ENCOUNTER — Other Ambulatory Visit: Payer: Self-pay | Admitting: Family Medicine

## 2018-11-13 DIAGNOSIS — E1159 Type 2 diabetes mellitus with other circulatory complications: Secondary | ICD-10-CM

## 2018-11-13 DIAGNOSIS — I152 Hypertension secondary to endocrine disorders: Secondary | ICD-10-CM

## 2019-01-15 LAB — HM DIABETES EYE EXAM

## 2019-02-04 ENCOUNTER — Telehealth: Payer: Self-pay

## 2019-02-04 NOTE — Telephone Encounter (Signed)
Lvm for pt to call back to complete covid screening questions. Alicia Lowery

## 2019-02-05 ENCOUNTER — Other Ambulatory Visit: Payer: Self-pay | Admitting: Family Medicine

## 2019-02-05 DIAGNOSIS — I152 Hypertension secondary to endocrine disorders: Secondary | ICD-10-CM

## 2019-02-05 DIAGNOSIS — E1159 Type 2 diabetes mellitus with other circulatory complications: Secondary | ICD-10-CM

## 2019-02-12 ENCOUNTER — Encounter: Payer: Self-pay | Admitting: Family Medicine

## 2019-02-12 ENCOUNTER — Other Ambulatory Visit: Payer: Self-pay

## 2019-02-12 ENCOUNTER — Ambulatory Visit (INDEPENDENT_AMBULATORY_CARE_PROVIDER_SITE_OTHER): Payer: 59 | Admitting: Family Medicine

## 2019-02-12 VITALS — BP 110/68 | HR 80 | Temp 98.2°F | Ht 62.0 in | Wt 255.6 lb

## 2019-02-12 DIAGNOSIS — Z8249 Family history of ischemic heart disease and other diseases of the circulatory system: Secondary | ICD-10-CM

## 2019-02-12 DIAGNOSIS — Z23 Encounter for immunization: Secondary | ICD-10-CM

## 2019-02-12 DIAGNOSIS — E119 Type 2 diabetes mellitus without complications: Secondary | ICD-10-CM

## 2019-02-12 DIAGNOSIS — I152 Hypertension secondary to endocrine disorders: Secondary | ICD-10-CM

## 2019-02-12 DIAGNOSIS — E1169 Type 2 diabetes mellitus with other specified complication: Secondary | ICD-10-CM

## 2019-02-12 DIAGNOSIS — E785 Hyperlipidemia, unspecified: Secondary | ICD-10-CM

## 2019-02-12 DIAGNOSIS — I10 Essential (primary) hypertension: Secondary | ICD-10-CM

## 2019-02-12 DIAGNOSIS — E1159 Type 2 diabetes mellitus with other circulatory complications: Secondary | ICD-10-CM

## 2019-02-12 LAB — POCT GLYCOSYLATED HEMOGLOBIN (HGB A1C): Hemoglobin A1C: 7.1 % — AB (ref 4.0–5.6)

## 2019-02-12 NOTE — Progress Notes (Signed)
  Subjective:    Patient ID: Alicia Lowery, female    DOB: [birth year], 59 y.o.   MRN: [id number]  Alicia Lowery is a 58 y.o. female who presents for follow-up of Type 2 diabetes mellitus.  Home blood sugar records: Meter records Current symptoms/problems include none at this tme. Daily foot checks: yes   Any foot concerns: none Exercise: walking 15 minutes a day Diet:regular Eye exam last month She continues on metformin as well as Ozempic and is having no difficulty with them.  She is also taking lisinopril/HCTZ with no cough or swelling.  She is on 20 mg of Crestor and having no aches or pains. The following portions of the patient's history were reviewed and updated as appropriate: allergies, current medications, past medical history, past social history and problem list.  ROS as in subjective above.     Objective:    Physical Exam Alert and in no distress otherwise not examined.  Weight and blood pressure was reviewed. Hemoglobin A1c is 7.1 Blood pressure 110/68, pulse 80, temperature 98.2 F (36.8 C), height 5\' 2"  (1.575 m), weight 255 lb 9.6 oz (115.9 kg), SpO2 96 %.  Lab Review Diabetic Labs Latest Ref Rng & Units 10/13/2018 06/11/2018 04/16/2017 12/29/2015 07/20/2015  HbA1c 4.0 - 5.6 % 7.3(A) 9.2(A) 7.0 7.1 6.7  Microalbumin mg/L - - 6.1 57.9 -  Micro/Creat Ratio - - - 2.6 46.5 -  Chol 100 - 199 mg/dL - 151 227(H) - -  HDL >39 mg/dL - 35(L) 43(L) - -  Calc LDL 0 - 99 mg/dL - 87 155(H) - -  Triglycerides 0 - 149 mg/dL - 144 156(H) - -  Creatinine 0.57 - 1.00 mg/dL - 0.72 0.79 - -   BP/Weight 02/12/2019 10/13/2018 06/11/2018 26/94/8546 06/20/348  Systolic BP 093 818 299 371 696  Diastolic BP 68 84 80 82 86  Wt. (Lbs) 255.6 253.4 265.2 269 265.2  BMI 46.75 44.89 46.98 47.65 46.98   Foot/eye exam completion dates Latest Ref Rng & Units 10/13/2018 06/11/2018  Eye Exam No Retinopathy - -  Foot Form Completion - Done Done    Shene  reports that she has never smoked. She has never used  smokeless tobacco. She reports current alcohol use. She reports that she does not use drugs.     Assessment & Plan:    Type 2 diabetes mellitus without complication, unspecified whether long term insulin use (HCC) - Plan: Amb Ref to Medical Weight Management, POCT glycosylated hemoglobin (Hb A1C)  Obesity, morbid ([place]) - Plan: Amb Ref to Medical Weight Management  Hypertension associated with diabetes ([place])  Family history of heart disease in female family member before age 75  Hyperlipidemia associated with type 2 diabetes mellitus ([name])  Need for influenza vaccination - Plan: Flu Vaccine QUAD 6+ mos PF IM (Fluarix Quad PF) I discussed her diet and exercise with her in detail.  Strongly encouraged her to make further changes in her eating habits specifically because none on white foods.  Will make the referral to medical weight management.  Explained that it could be several months before she gets call back on that.  She will continue on her present medication regimens.

## 2019-02-20 ENCOUNTER — Other Ambulatory Visit: Payer: Self-pay | Admitting: Family Medicine

## 2019-05-19 ENCOUNTER — Other Ambulatory Visit: Payer: Self-pay

## 2019-05-19 ENCOUNTER — Ambulatory Visit (INDEPENDENT_AMBULATORY_CARE_PROVIDER_SITE_OTHER): Payer: BC Managed Care – PPO | Admitting: Family Medicine

## 2019-05-19 ENCOUNTER — Encounter: Payer: Self-pay | Admitting: Family Medicine

## 2019-05-19 VITALS — BP 124/84 | HR 82 | Temp 98.0°F | Wt 250.6 lb

## 2019-05-19 DIAGNOSIS — M109 Gout, unspecified: Secondary | ICD-10-CM | POA: Diagnosis not present

## 2019-05-19 MED ORDER — COLCHICINE 0.6 MG PO TABS: 0.6000 mg | ORAL_TABLET | Freq: Two times a day (BID) | ORAL | 1 refills | Status: DC

## 2019-05-19 NOTE — Progress Notes (Signed)
   Subjective:    Patient ID: Alicia Lowery, female    DOB: Nov 12, 1960, 59 y.o.   MRN: [id number]  HPI She was awakened Saturday by left great toe pain.  No other joints are involved.  She has occasionally used ibuprofen for relief of this.  No previous history of gout.  She does have an underlying history of diabetes.  Review of Systems     Objective:   Physical Exam Exam of the left great toe shows tenderness to palpation with some joint effusion noted.  Pain on motion of the joint.  No other joints are involved.       Assessment & Plan:  Gouty arthritis of left great toe - Plan: colchicine 0.6 MG tablet Clinically she has gout and I will go ahead and treat her.  Discussed the treatment as well as follow-up.  She can take Tylenol or NSAID if she needs to to help with this.  She will keep in touch with me if this does not go away otherwise I will see her for her regular diabetes follow-up.

## 2019-05-19 NOTE — Patient Instructions (Signed)
 Gout  Gout is painful swelling of your joints. Gout is a type of arthritis. It is caused by having too much uric acid in your body. Uric acid is a chemical that is made when your body breaks down substances called purines. If your body has too much uric acid, sharp crystals can form and build up in your joints. This causes pain and swelling. Gout attacks can happen quickly and be very painful (acute gout). Over time, the attacks can affect more joints and happen more often (chronic gout). What are the causes?  Too much uric acid in your blood. This can happen because: ? Your kidneys do not remove enough uric acid from your blood. ? Your body makes too much uric acid. ? You eat too many foods that are high in purines. These foods include organ meats, some seafood, and beer.  Trauma or stress. What increases the risk?  Having a family history of gout.  Being female and middle-aged.  Being female and having gone through menopause.  Being very overweight (obese).  Drinking alcohol, especially beer.  Not having enough water in the body (being dehydrated).  Losing weight too quickly.  Having an organ transplant.  Having lead poisoning.  Taking certain medicines.  Having kidney disease.  Having a skin condition called psoriasis. What are the signs or symptoms? An attack of acute gout usually happens in just one joint. The most common place is the big toe. Attacks often start at night. Other joints that may be affected include joints of the feet, ankle, knee, fingers, wrist, or elbow. Symptoms of an attack may include:  Very bad pain.  Warmth.  Swelling.  Stiffness.  Shiny, red, or purple skin.  Tenderness. The affected joint may be very painful to touch.  Chills and fever. Chronic gout may cause symptoms more often. More joints may be involved. You may also have white or yellow lumps (tophi) on your hands or feet or in other areas near your joints. How is this  treated?  Treatment for this condition has two phases: treating an acute attack and preventing future attacks.  Acute gout treatment may include: ? NSAIDs. ? Steroids. These are taken by mouth or injected into a joint. ? Colchicine. This medicine relieves pain and swelling. It can be given by mouth or through an IV tube.  Preventive treatment may include: ? Taking small doses of NSAIDs or colchicine daily. ? Using a medicine that reduces uric acid levels in your blood. ? Making changes to your diet. You may need to see a food expert (dietitian) about what to eat and drink to prevent gout. Follow these instructions at home: During a gout attack   If told, put ice on the painful area: ? Put ice in a plastic bag. ? Place a towel between your skin and the bag. ? Leave the ice on for 20 minutes, 2-3 times a day.  Raise (elevate) the painful joint above the level of your heart as often as you can.  Rest the joint as much as possible. If the joint is in your leg, you may be given crutches.  Follow instructions from your doctor about what you cannot eat or drink. Avoiding future gout attacks  Eat a low-purine diet. Avoid foods and drinks such as: ? Liver. ? Kidney. ? Anchovies. ? Asparagus. ? Herring. ? Mushrooms. ? Mussels. ? Beer.  Stay at a healthy weight. If you want to lose weight, talk with your doctor. Do not lose   weight too fast.  Start or continue an exercise plan as told by your doctor. Eating and drinking  Drink enough fluids to keep your pee (urine) pale yellow.  If you drink alcohol: ? Limit how much you use to:  0-1 drink a day for women.  0-2 drinks a day for men. ? Be aware of how much alcohol is in your drink. In the [place] one drink equals one 12 oz bottle of beer (355 mL), one 5 oz glass of wine (148 mL), or one 1 oz glass of hard liquor (44 mL). General instructions  Take over-the-counter and prescription medicines only as told by your doctor.  Do  not drive or use heavy machinery while taking prescription pain medicine.  Return to your normal activities as told by your doctor. Ask your doctor what activities are safe for you.  Keep all follow-up visits as told by your doctor. This is important. Contact a doctor if:  You have another gout attack.  You still have symptoms of a gout attack after 10 days of treatment.  You have problems (side effects) because of your medicines.  You have chills or a fever.  You have burning pain when you pee (urinate).  You have pain in your lower back or belly. Get help right away if:  You have very bad pain.  Your pain cannot be controlled.  You cannot pee. Summary  Gout is painful swelling of the joints.  The most common site of pain is the big toe, but it can affect other joints.  Medicines and avoiding some foods can help to prevent and treat gout attacks. This information is not intended to replace advice given to you by your health care provider. Make sure you discuss any questions you have with your health care provider. Document Revised: 11/20/2017 Document Reviewed: 11/20/2017 Elsevier Patient Education  2020 [employer]  

## 2019-05-21 ENCOUNTER — Encounter: Payer: Self-pay | Admitting: Family Medicine

## 2019-05-21 ENCOUNTER — Telehealth: Payer: Self-pay

## 2019-05-21 NOTE — Telephone Encounter (Signed)
Give her a note

## 2019-05-21 NOTE — Telephone Encounter (Signed)
Pt. Called stating she was seen here on 05/19/19 for gout and she said she needs a note for work stating that she was seen her on 05/19/19 and that she should be off of her feet for 2 days. If this is ok I will type the note up for her.

## 2019-05-21 NOTE — Telephone Encounter (Signed)
Called pt. LM that note is ready for pick up.

## 2019-05-24 ENCOUNTER — Other Ambulatory Visit: Payer: Self-pay | Admitting: Family Medicine

## 2019-05-27 ENCOUNTER — Telehealth: Payer: Self-pay | Admitting: Family Medicine

## 2019-05-27 NOTE — Telephone Encounter (Signed)
LVM for pt to call back to schedule an virtual appt for FMLA paper work to be fill out. KH

## 2019-05-27 NOTE — Telephone Encounter (Signed)
Pt dropped of FMLA paperwork to be filled out for when she had gout, pt can be reached at [phone] when ready to be picked up put in your folder

## 2019-05-27 NOTE — Telephone Encounter (Signed)
Let her know that I was unaware that she took any time off from work and have her make an appointment.  This can be a virtual

## 2019-06-01 ENCOUNTER — Telehealth: Payer: Self-pay

## 2019-06-01 NOTE — Telephone Encounter (Signed)
Called pt to advise of need for an appt to complete FMLA paper work. KH

## 2019-06-02 ENCOUNTER — Other Ambulatory Visit: Payer: Self-pay | Admitting: Family Medicine

## 2019-06-02 ENCOUNTER — Encounter: Payer: Self-pay | Admitting: Family Medicine

## 2019-06-02 ENCOUNTER — Ambulatory Visit (INDEPENDENT_AMBULATORY_CARE_PROVIDER_SITE_OTHER): Payer: BC Managed Care – PPO | Admitting: Family Medicine

## 2019-06-02 ENCOUNTER — Other Ambulatory Visit: Payer: Self-pay

## 2019-06-02 VITALS — BP 142/72 | HR 81 | Temp 99.3°F | Wt 256.6 lb

## 2019-06-02 DIAGNOSIS — M109 Gout, unspecified: Secondary | ICD-10-CM | POA: Diagnosis not present

## 2019-06-02 DIAGNOSIS — E119 Type 2 diabetes mellitus without complications: Secondary | ICD-10-CM

## 2019-06-02 NOTE — Progress Notes (Signed)
   Subjective:    Patient ID: Alicia Lowery, female    DOB: Aug 11, 1960, 59 y.o.   MRN: [id number]  HPI She is here for consult concerning gout.  She was given colchicine and after the second dose states that she is roughly 50% better.  She continues with ibuprofen and did get some improvement of her symptoms.  She was given a note to stay off her feet for 2 days however she stayed out of work for the last 2 days.  She did bring in FMLA form.   Review of Systems     Objective:   Physical Exam Alert and in no distress otherwise not examined      Assessment & Plan:  Gouty arthritis of left great toe I explained that I did not know she was not going to work and it would not be appropriate to fill out an FMLA form for this.  She does have a note for 2 days off of her feet and hopefully this will suffice.

## 2019-06-16 ENCOUNTER — Encounter (INDEPENDENT_AMBULATORY_CARE_PROVIDER_SITE_OTHER): Payer: Self-pay

## 2019-06-22 ENCOUNTER — Ambulatory Visit (INDEPENDENT_AMBULATORY_CARE_PROVIDER_SITE_OTHER): Payer: BC Managed Care – PPO | Admitting: Bariatrics

## 2019-06-23 ENCOUNTER — Other Ambulatory Visit: Payer: Self-pay

## 2019-06-23 ENCOUNTER — Ambulatory Visit (INDEPENDENT_AMBULATORY_CARE_PROVIDER_SITE_OTHER): Payer: BC Managed Care – PPO | Admitting: Family Medicine

## 2019-06-23 ENCOUNTER — Encounter: Payer: Self-pay | Admitting: Family Medicine

## 2019-06-23 VITALS — BP 138/72 | HR 76 | Temp 96.8°F | Wt 250.0 lb

## 2019-06-23 DIAGNOSIS — Z1231 Encounter for screening mammogram for malignant neoplasm of breast: Secondary | ICD-10-CM

## 2019-06-23 DIAGNOSIS — E119 Type 2 diabetes mellitus without complications: Secondary | ICD-10-CM | POA: Diagnosis not present

## 2019-06-23 DIAGNOSIS — M109 Gout, unspecified: Secondary | ICD-10-CM

## 2019-06-23 DIAGNOSIS — E1159 Type 2 diabetes mellitus with other circulatory complications: Secondary | ICD-10-CM

## 2019-06-23 DIAGNOSIS — E1169 Type 2 diabetes mellitus with other specified complication: Secondary | ICD-10-CM

## 2019-06-23 DIAGNOSIS — Z23 Encounter for immunization: Secondary | ICD-10-CM | POA: Diagnosis not present

## 2019-06-23 DIAGNOSIS — E785 Hyperlipidemia, unspecified: Secondary | ICD-10-CM

## 2019-06-23 DIAGNOSIS — I10 Essential (primary) hypertension: Secondary | ICD-10-CM

## 2019-06-23 DIAGNOSIS — I152 Hypertension secondary to endocrine disorders: Secondary | ICD-10-CM

## 2019-06-23 LAB — POCT GLYCOSYLATED HEMOGLOBIN (HGB A1C): Hemoglobin A1C: 7.1 % — AB (ref 4.0–5.6)

## 2019-06-23 LAB — POCT UA - MICROALBUMIN
Albumin/Creatinine Ratio, Urine, POC: <6
Creatinine, POC: 82.8 mg/dL
Microalbumin Ur, POC: <5 mg/L

## 2019-06-23 MED ORDER — OZEMPIC (1 MG/DOSE) 2 MG/1.5ML ~~LOC~~ SOPN: 1.0000 mg | PEN_INJECTOR | SUBCUTANEOUS | 1 refills | Status: DC

## 2019-06-23 NOTE — Progress Notes (Signed)
  Subjective:    Patient ID: Alicia Lowery, female    DOB: 1961/02/29, 59 y.o.   MRN: [id number]  Alicia Lowery is a 59 y.o. female who presents for follow-up of Type 2 diabetes mellitus.  Home blood sugar records: meter records 101-129 fasting  Current symptoms/problems include none at this time. Daily foot checks: yes   Any foot concerns: gout in left foot great toe Exercise: walking intermittently Diet: regular limited carbs. She continues on lisinopril  and having no difficulty with that.  She continues on metformin as well as Ozempic.  Crestor is causing no difficulty.  She has not had any more gout outbreaks. The following portions of the patient's history were reviewed and updated as appropriate: allergies, current medications, past medical history, past social history and problem list.  ROS as in subjective above.     Objective:    Physical Exam Alert and in no distress .  Foot exam normal except difficult to feel pulses due to adiposity.   Lab Review Diabetic Labs Latest Ref Rng & Units 02/12/2019 10/13/2018 06/11/2018 04/16/2017 12/29/2015  HbA1c 4.0 - 5.6 % 7.1(A) 7.3(A) 9.2(A) 7.0 7.1  Microalbumin mg/L - - - 6.1 57.9  Micro/Creat Ratio - - - - 2.6 46.5  Chol 100 - 199 mg/dL - - 254 982(M) -  HDL >41 mg/dL - - 58(X) 09(M) -  Calc LDL 0 - 99 mg/dL - - 87 076(K) -  Triglycerides 0 - 149 mg/dL - - 088 110(R) -  Creatinine 0.57 - 1.00 mg/dL - - 1.59 4.58 -   BP/Weight 06/02/2019 05/19/2019 02/12/2019 10/13/2018 06/11/2018  Systolic BP 142 124 110 140 124  Diastolic BP 72 84 68 84 80  Wt. (Lbs) 256.6 250.6 255.6 253.4 265.2  BMI 46.93 45.84 46.75 44.89 46.98   Foot/eye exam completion dates Latest Ref Rng & Units 01/15/2019 10/13/2018  Eye Exam No Retinopathy No Retinopathy -  Foot Form Completion - - Done  Hemoglobin A1c is 7.1  Alicia Lowery  reports that she has never smoked. She has never used smokeless tobacco. She reports current alcohol use. She reports that she does not use  drugs.     Assessment & Plan:    Type 2 diabetes mellitus without complication, unspecified whether long term insulin use (HCC) - Plan: Semaglutide, 1 MG/DOSE, (OZEMPIC, 1 MG/DOSE,) 2 MG/1.5ML SOPN, CBC with Differential/Platelet, Comprehensive metabolic panel, Lipid panel, POCT UA - Microalbumin  Need for shingles vaccine - Plan: Varicella-zoster vaccine IM (Shingrix)  Encounter for screening mammogram for malignant neoplasm of breast - Plan: MM DIGITAL SCREENING BILATERAL  Obesity, morbid (HCC)  Hypertension associated with diabetes (HCC) - Plan: CBC with Differential/Platelet, Comprehensive metabolic panel  Hyperlipidemia associated with type 2 diabetes mellitus (HCC) - Plan: Lipid panel  Gout, unspecified cause, unspecified chronicity, unspecified site   1. Rx changes: Increase Ozempic to 1 mg weekly 2. Education: Reviewed 'ABCs' of diabetes management (respective goals in parentheses):  A1C (<7), blood pressure (<130/80), and cholesterol (LDL <100). 3. Compliance at present is estimated to be good. Efforts to improve compliance (if necessary) will be directed at increased exercise. 4. Follow up: 4 months Her A1c looks good however her weight is unchanged.  Hopefully increasing the Ozempic will help with her weight reduction.

## 2019-06-24 ENCOUNTER — Encounter (INDEPENDENT_AMBULATORY_CARE_PROVIDER_SITE_OTHER): Payer: Self-pay | Admitting: Bariatrics

## 2019-06-24 ENCOUNTER — Ambulatory Visit (INDEPENDENT_AMBULATORY_CARE_PROVIDER_SITE_OTHER): Payer: BC Managed Care – PPO | Admitting: Bariatrics

## 2019-06-24 VITALS — BP 121/70 | HR 76 | Temp 98.3°F | Ht 62.0 in | Wt 246.0 lb

## 2019-06-24 DIAGNOSIS — R5383 Other fatigue: Secondary | ICD-10-CM | POA: Diagnosis not present

## 2019-06-24 DIAGNOSIS — E119 Type 2 diabetes mellitus without complications: Secondary | ICD-10-CM

## 2019-06-24 DIAGNOSIS — I10 Essential (primary) hypertension: Secondary | ICD-10-CM | POA: Diagnosis not present

## 2019-06-24 DIAGNOSIS — M109 Gout, unspecified: Secondary | ICD-10-CM

## 2019-06-24 DIAGNOSIS — Z0289 Encounter for other administrative examinations: Secondary | ICD-10-CM

## 2019-06-24 DIAGNOSIS — Z9189 Other specified personal risk factors, not elsewhere classified: Secondary | ICD-10-CM | POA: Diagnosis not present

## 2019-06-24 DIAGNOSIS — Z1331 Encounter for screening for depression: Secondary | ICD-10-CM

## 2019-06-24 DIAGNOSIS — E7849 Other hyperlipidemia: Secondary | ICD-10-CM

## 2019-06-24 DIAGNOSIS — R0602 Shortness of breath: Secondary | ICD-10-CM

## 2019-06-24 DIAGNOSIS — Z6841 Body Mass Index (BMI) 40.0 and over, adult: Secondary | ICD-10-CM

## 2019-06-24 LAB — LIPID PANEL
Chol/HDL Ratio: 5.4 ratio — ABNORMAL HIGH (ref 0.0–4.4)
Cholesterol, Total: 247 mg/dL — ABNORMAL HIGH (ref 100–199)
HDL: 46 mg/dL (ref >39)
LDL Chol Calc (NIH): 179 mg/dL — ABNORMAL HIGH (ref 0–99)
Triglycerides: 123 mg/dL (ref 0–149)
VLDL Cholesterol Cal: 22 mg/dL (ref 5–40)

## 2019-06-24 LAB — COMPREHENSIVE METABOLIC PANEL
ALT: 29 IU/L (ref 0–32)
AST: 19 IU/L (ref 0–40)
Albumin/Globulin Ratio: 1.4 (ref 1.2–2.2)
Albumin: 4.3 g/dL (ref 3.8–4.9)
Alkaline Phosphatase: 75 IU/L (ref 39–117)
BUN/Creatinine Ratio: 14 (ref 12–28)
BUN: 13 mg/dL (ref 8–27)
Bilirubin Total: 1.1 mg/dL (ref 0.0–1.2)
CO2: 25 mmol/L (ref 20–29)
Calcium: 10.6 mg/dL — ABNORMAL HIGH (ref 8.7–10.3)
Chloride: 100 mmol/L (ref 96–106)
Creatinine, Ser: 0.94 mg/dL (ref 0.57–1.00)
GFR calc Af Amer: 76 mL/min/{1.73_m2} (ref >59)
GFR calc non Af Amer: 66 mL/min/{1.73_m2} (ref >59)
Globulin, Total: 3.1 g/dL (ref 1.5–4.5)
Glucose: 118 mg/dL — ABNORMAL HIGH (ref 65–99)
Potassium: 4.2 mmol/L (ref 3.5–5.2)
Sodium: 141 mmol/L (ref 134–144)
Total Protein: 7.4 g/dL (ref 6.0–8.5)

## 2019-06-24 LAB — CBC WITH DIFFERENTIAL/PLATELET
Basophils Absolute: 0 x10E3/uL (ref 0.0–0.2)
Basos: 1 %
EOS (ABSOLUTE): 0.1 x10E3/uL (ref 0.0–0.4)
Eos: 3 %
Hematocrit: 39.4 % (ref 34.0–46.6)
Hemoglobin: 13.1 g/dL (ref 11.1–15.9)
Immature Grans (Abs): 0 x10E3/uL (ref 0.0–0.1)
Immature Granulocytes: 0 %
Lymphocytes Absolute: 1.5 x10E3/uL (ref 0.7–3.1)
Lymphs: 42 %
MCH: 30.3 pg (ref 26.6–33.0)
MCHC: 33.2 g/dL (ref 31.5–35.7)
MCV: 91 fL (ref 79–97)
Monocytes Absolute: 0.6 x10E3/uL (ref 0.1–0.9)
Monocytes: 16 %
Neutrophils Absolute: 1.4 x10E3/uL (ref 1.4–7.0)
Neutrophils: 38 %
Platelets: 233 x10E3/uL (ref 150–450)
RBC: 4.32 x10E6/uL (ref 3.77–5.28)
RDW: 12.9 % (ref 11.7–15.4)
WBC: 3.7 x10E3/uL (ref 3.4–10.8)

## 2019-06-24 NOTE — Progress Notes (Signed)
Dear Dr. Sharlot Gowda,   Thank you for referring Alicia Lowery to our clinic. The following note includes my evaluation and treatment recommendations.  Chief Complaint:   OBESITY Alicia Lowery (MR# [id number]) is a 59 y.o. female who presents for evaluation and treatment of obesity and related comorbidities. Current BMI is Body mass index is 44.99 kg/m.Marland Kitchen Alicia Lowery has been struggling with her weight for many years and has been unsuccessful in either losing weight, maintaining weight loss, or reaching her healthy weight goal.  Alicia Lowery is currently in the action stage of change and ready to dedicate time achieving and maintaining a healthier weight. Alicia Lowery is interested in becoming our patient and working on intensive lifestyle modifications including (but not limited to) diet and exercise for weight loss.  Alicia Lowery does like to cook. She craves rice, chocolate, and beer. She describes herself as a somewhat picky eater.   Alicia Lowery's habits were reviewed today and are as follows: her desired weight loss is 96 lbs, she has been heavy most of her adult life, she started gaining weight at age 25, her heaviest weight ever was 257 pounds, she is somewhat of a picky eater and doesn't like to eat healthier foods, she craves rice, chocolate, and beer, she skips lunch and dinner frequently, she is frequently drinking liquids with calories, she sometimes has problems with excessive hunger, she frequently eats larger portions than normal, she has binge eating behaviors and she struggles with emotional eating.  Depression Screen Alicia Lowery Food and Mood (modified PHQ-9) score was 6.  Depression screen PHQ 2/9 06/24/2019  Decreased Interest 1  Down, Depressed, Hopeless 0  PHQ - 2 Score 1  Altered sleeping 1  Tired, decreased energy 1  Change in appetite 2  Feeling bad or failure about yourself  0  Trouble concentrating 1  Moving slowly or fidgety/restless 0  Suicidal thoughts 0  PHQ-9 Score 6   Subjective:    Other fatigue. Alicia Lowery denies daytime somnolence and denies waking up still tired. Alicia Lowery generally gets 5-7 hours of sleep per night, and states that she has generally restful sleep. Snoring is sometimes present. Apneic episodes are not present. Epworth Sleepiness Score is 5.   Shortness of breath on exertion. Alicia Lowery notes increasing shortness of breath with certain activities and seems to be worsening over time with weight gain. She notes getting out of breath sooner with activity than she used to. This has gotten worse recently. Alicia Lowery denies shortness of breath at rest or orthopnea.  Type 2 diabetes mellitus without complication, without long-term current use of insulin (HCC). Alicia Lowery is taking Ozempic and metformin.  Lab Results  Component Value Date   HGBA1C 7.1 (A) 06/23/2019   HGBA1C 7.1 (A) 02/12/2019   HGBA1C 7.3 (A) 10/13/2018   Lab Results  Component Value Date   MICROALBUR <5.0 06/23/2019   LDLCALC 179 (H) 06/23/2019   CREATININE 0.94 06/23/2019   No results found for: INSULIN  Essential hypertension associated with diabetes mellitus. Alicia Lowery is taking lisinopril/HCTZ.    BP Readings from Last 3 Encounters:  06/24/19 121/70  06/23/19 138/72  06/02/19 (!) 142/72   Lab Results  Component Value Date   CREATININE 0.94 06/23/2019   CREATININE 0.72 06/11/2018   CREATININE 0.79 04/16/2017   Gout, unspecified cause, unspecified chronicity, unspecified site. Alicia Lowery takes colchicine when she has a flare.   Other hyperlipidemia associated with type II diabetes mellitus. Alicia Lowery is taking Crestor.   Lab Results  Component Value Date  CHOL 247 (H) 06/23/2019   HDL 46 06/23/2019   LDLCALC 179 (H) 06/23/2019   TRIG 123 06/23/2019   CHOLHDL 5.4 (H) 06/23/2019   Lab Results  Component Value Date   ALT 29 06/23/2019   AST 19 06/23/2019   ALKPHOS 75 06/23/2019   BILITOT 1.1 06/23/2019   The 10-year ASCVD risk score [name], et al., 2013) is: 17.7%   Values used to  calculate the score:     Age: 35 years     Sex: Female     Is Non-Hispanic African American: Yes     Diabetic: Yes     Tobacco smoker: No     Systolic Blood Pressure: 121 mmHg     Is BP treated: Yes     HDL Cholesterol: 46 mg/dL     Total Cholesterol: 247 mg/dL  Depression screening. Alicia Lowery had a mildly positive depression screen with a PHQ-9 score of 6.   At risk for hypoglycemia. Alicia Lowery is at increased risk for hypoglycemia due to diabetes mellitus II and dietary changes.   Assessment/Plan:   Other fatigue. Alicia Lowery does not feel that her weight is causing her energy to be lower than it should be. Fatigue may be related to obesity, depression or many other causes. Labs will be ordered, and in the meanwhile, Alicia Lowery will focus on self care including making healthy food choices, increasing physical activity and focusing on stress reduction. EKG 12-Lead, VITAMIN D 25 Hydroxy (Vit-D Deficiency, Fractures), T3, T4, free, TSH ordered.  Shortness of breath on exertion. Alicia Lowery does feel that she gets out of breath more easily that she used to when she exercises. Alicia Lowery's shortness of breath appears to be obesity related and exercise induced. She has agreed to work on weight loss and gradually increase exercise to treat her exercise induced shortness of breath. Will continue to monitor closely.  Type 2 diabetes mellitus without complication, without long-term current use of insulin (HCC). Good blood sugar control is important to decrease the likelihood of diabetic complications such as nephropathy, neuropathy, limb loss, blindness, coronary artery disease, and death. Intensive lifestyle modification including diet, exercise and weight loss are the first line of treatment for diabetes. Alicia Lowery will continue her medications as prescribed. C-peptide, Insulin, random labs ordered.  Essential hypertension. Alicia Lowery is working on healthy weight loss and exercise to improve blood pressure control. We will watch  for signs of hypotension as she continues her lifestyle modifications. She will continue her medications as prescribed.  Gout, unspecified cause, unspecified chronicity, unspecified site. Alicia Lowery will avoid trigger foods.  Other hyperlipidemia. Cardiovascular risk and specific lipid/LDL goals reviewed.  We discussed several lifestyle modifications today and Alicia Lowery will continue to work on diet, exercise and weight loss efforts. Orders and follow up as documented in patient record. Alicia Lowery will continue Crestor as prescribed.  Counseling Intensive lifestyle modifications are the first line treatment for this issue. . Dietary changes: Increase soluble fiber. Decrease simple carbohydrates. . Exercise changes: Moderate to vigorous-intensity aerobic activity 150 minutes per week if tolerated. . Lipid-lowering medications: see documented in medical record.  Depression screening. Alicia Lowery had a positive depression screening. Depression is commonly associated with obesity and often results in emotional eating behaviors. We will monitor this closely and work on CBT to help improve the non-hunger eating patterns. Referral to Psychology may be required if no improvement is seen as she continues in our clinic.  At risk for hypoglycemia. Alicia Lowery was given approximately 15 minutes of counseling today regarding prevention  of hypoglycemia. She was advised of symptoms of hypoglycemia. Alicia Lowery was instructed to avoid skipping meals, eat regular protein rich meals and schedule low calorie snacks as needed.   Repetitive spaced learning was employed today to elicit superior memory formation and behavioral change.   Class 3 severe obesity with serious comorbidity and body mass index (BMI) of 45.0 to 49.9 in adult, unspecified obesity type (Alicia Lowery).  Alicia Lowery is currently in the action stage of change and her goal is to continue with weight loss efforts. I recommend Alicia Lowery begin the structured treatment plan as follows:  She  has agreed to the Category 1 Plan.  She will work on meal planning and intentional eating.  We independently reviewed 02/12/2019 labs with the patient including A1c and labs from 06/11/2018 including chemistries, lipids, CBC, and glucose.  Exercise goals: All adults should avoid inactivity. Some physical activity is better than none, and adults who participate in any amount of physical activity gain some health benefits.   Behavioral modification strategies: increasing lean protein intake, decreasing simple carbohydrates, increasing vegetables, increasing water intake, decreasing eating out, no skipping meals, meal planning and cooking strategies, keeping healthy foods in the home and planning for success.  She was informed of the importance of frequent follow-up visits to maximize her success with intensive lifestyle modifications for her multiple health conditions. She was informed we would discuss her lab results at her next visit unless there is a critical issue that needs to be addressed sooner. Alicia Lowery agreed to keep her next visit at the agreed upon time to discuss these results.  Objective:   Blood pressure 121/70, pulse 76, temperature 98.3 F (36.8 C), height 5\' 2"  (1.575 m), weight 246 lb (111.6 kg), SpO2 98 %. Body mass index is 44.99 kg/m.  EKG: Sinus  Rhythm with a rate of 81 BPM. Right bundle branch block with left axis - bifascicular block. Abnormal.   Indirect Calorimeter completed today shows a VO2 of 183 and a REE of 1276.  Her calculated basal metabolic rate is 8413 thus her basal metabolic rate is worse than expected.  General: Cooperative, alert, well developed, in no acute distress. HEENT: Conjunctivae and lids unremarkable. Cardiovascular: Regular rhythm.  Lungs: Normal work of breathing. Neurologic: No focal deficits.   Lab Results  Component Value Date   CREATININE 0.94 06/23/2019   BUN 13 06/23/2019   NA 141 06/23/2019   K 4.2 06/23/2019   CL 100 06/23/2019    CO2 25 06/23/2019   Lab Results  Component Value Date   ALT 29 06/23/2019   AST 19 06/23/2019   ALKPHOS 75 06/23/2019   BILITOT 1.1 06/23/2019   Lab Results  Component Value Date   HGBA1C 7.1 (A) 06/23/2019   HGBA1C 7.1 (A) 02/12/2019   HGBA1C 7.3 (A) 10/13/2018   HGBA1C 9.2 (A) 06/11/2018   HGBA1C 7.0 04/16/2017   No results found for: INSULIN No results found for: TSH Lab Results  Component Value Date   CHOL 247 (H) 06/23/2019   HDL 46 06/23/2019   LDLCALC 179 (H) 06/23/2019   TRIG 123 06/23/2019   CHOLHDL 5.4 (H) 06/23/2019   Lab Results  Component Value Date   WBC 3.7 06/23/2019   HGB 13.1 06/23/2019   HCT 39.4 06/23/2019   MCV 91 06/23/2019   PLT 233 06/23/2019   No results found for: IRON, TIBC, FERRITIN  Attestation Statements:   Reviewed by clinician on day of visit: allergies, medications, problem list, medical history, surgical history, family  history, social history, and previous encounter notes.  Migdalia Dk, am acting as Location manager for [employer], DO   I have reviewed the above documentation for accuracy and completeness, and I agree with the above. Jearld Lesch, DO

## 2019-06-25 LAB — INSULIN, RANDOM: INSULIN: 17.6 u[IU]/mL (ref 2.6–24.9)

## 2019-06-25 LAB — C-PEPTIDE: C-Peptide: 4.8 ng/mL — ABNORMAL HIGH (ref 1.1–4.4)

## 2019-06-25 LAB — VITAMIN D 25 HYDROXY (VIT D DEFICIENCY, FRACTURES): Vit D, 25-Hydroxy: 14.7 ng/mL — ABNORMAL LOW (ref 30.0–100.0)

## 2019-06-25 LAB — T3: T3, Total: 151 ng/dL (ref 71–180)

## 2019-06-25 LAB — T4, FREE: Free T4: 1.26 ng/dL (ref 0.82–1.77)

## 2019-06-25 LAB — TSH: TSH: 1.28 u[IU]/mL (ref 0.450–4.500)

## 2019-06-29 ENCOUNTER — Encounter (INDEPENDENT_AMBULATORY_CARE_PROVIDER_SITE_OTHER): Payer: Self-pay | Admitting: Bariatrics

## 2019-06-29 DIAGNOSIS — E559 Vitamin D deficiency, unspecified: Secondary | ICD-10-CM | POA: Insufficient documentation

## 2019-07-08 ENCOUNTER — Encounter (INDEPENDENT_AMBULATORY_CARE_PROVIDER_SITE_OTHER): Payer: Self-pay | Admitting: Bariatrics

## 2019-07-08 ENCOUNTER — Other Ambulatory Visit: Payer: Self-pay

## 2019-07-08 ENCOUNTER — Ambulatory Visit (INDEPENDENT_AMBULATORY_CARE_PROVIDER_SITE_OTHER): Payer: BC Managed Care – PPO | Admitting: Bariatrics

## 2019-07-08 DIAGNOSIS — E785 Hyperlipidemia, unspecified: Secondary | ICD-10-CM

## 2019-07-08 DIAGNOSIS — E1169 Type 2 diabetes mellitus with other specified complication: Secondary | ICD-10-CM

## 2019-07-08 DIAGNOSIS — Z794 Long term (current) use of insulin: Secondary | ICD-10-CM

## 2019-07-08 DIAGNOSIS — E559 Vitamin D deficiency, unspecified: Secondary | ICD-10-CM

## 2019-07-08 DIAGNOSIS — Z9189 Other specified personal risk factors, not elsewhere classified: Secondary | ICD-10-CM

## 2019-07-08 DIAGNOSIS — E119 Type 2 diabetes mellitus without complications: Secondary | ICD-10-CM

## 2019-07-08 DIAGNOSIS — Z6841 Body Mass Index (BMI) 40.0 and over, adult: Secondary | ICD-10-CM

## 2019-07-08 MED ORDER — VITAMIN D (ERGOCALCIFEROL) 1.25 MG (50000 UNIT) PO CAPS: 50000.0000 [IU] | ORAL_CAPSULE | ORAL | 0 refills | Status: DC

## 2019-07-08 NOTE — Progress Notes (Signed)
The 10-year ASCVD risk score Denman George DC Montez Hageman., et al., 2013) is: 11.1%   Values used to calculate the score:     Age: 59 years     Sex: Female     Is Non-Hispanic African American: Yes     Diabetic: Yes     Tobacco smoker: No     Systolic Blood Pressure: 102 mmHg     Is BP treated: Yes     HDL Cholesterol: 46 mg/dL     Total Cholesterol: 247 mg/dL

## 2019-07-08 NOTE — Progress Notes (Signed)
Chief Complaint:   OBESITY Alicia Lowery is here to discuss her progress with her obesity treatment plan along with follow-up of her obesity related diagnoses. Alicia Lowery is on the Category 1 Plan and states she is following her eating plan approximately 98% of the time. Alicia Lowery states she is walking 15 minutes 3 times per week.  Today's visit was #: 2 Starting weight: 246 lbs Starting date: 06/24/2019 Today's weight: 239 lbs Today's date: 07/08/2019 Total lbs lost to date: 7 Total lbs lost since last in-office visit: 7  Interim History: Alicia Lowery is down 7 lbs. She states that the plan wasn't that hard. She reports drinking adequate water.  Subjective:   Hypercalcemia. Alicia Lowery had an elevated calcium level of 10.6 on 06/23/2019.  Vitamin D deficiency. Last Vitamin D level low at 14.7 on 06/24/2019.  Type 2 diabetes mellitus without complication, with long-term current use of insulin (Alicia Lowery). Last A1c 7.1 on 06/23/2019 with an insulin level of 17.6 on 06/24/2019.  Lab Results  Component Value Date   HGBA1C 7.1 (A) 06/23/2019   HGBA1C 7.1 (A) 02/12/2019   HGBA1C 7.3 (A) 10/13/2018   Lab Results  Component Value Date   MICROALBUR <5.0 06/23/2019   LDLCALC 179 (H) 06/23/2019   CREATININE 0.94 06/23/2019   Lab Results  Component Value Date   INSULIN 17.6 06/24/2019   Hyperlipidemia associated with type 2 diabetes mellitus (Alicia Lowery). Alicia Lowery is taking Crestor. Her ASCVD score is 11.1%, which puts her at intermediate risk.  Lab Results  Component Value Date   CHOL 247 (H) 06/23/2019   HDL 46 06/23/2019   LDLCALC 179 (H) 06/23/2019   TRIG 123 06/23/2019   CHOLHDL 5.4 (H) 06/23/2019   Lab Results  Component Value Date   ALT 29 06/23/2019   AST 19 06/23/2019   ALKPHOS 75 06/23/2019   BILITOT 1.1 06/23/2019   The 10-year ASCVD risk score Mikey Bussing DC Jr., et al., 2013) is: 11.1%   Values used to calculate the score:     Age: 59 years     Sex: Female     Is Non-Hispanic African  American: Yes     Diabetic: Yes     Tobacco smoker: No     Systolic Blood Pressure: 254 mmHg     Is BP treated: Yes     HDL Cholesterol: 46 mg/dL     Total Cholesterol: 247 mg/dL  At risk for osteoporosis. Alicia Lowery is at higher risk of osteopenia and osteoporosis due to Vitamin D deficiency.   Assessment/Plan:   Hypercalcemia. Alicia Lowery will have PTH, intact and calcium levels drawn.  Vitamin D deficiency. Low Vitamin D level contributes to fatigue and are associated with obesity, breast, and colon cancer. She was given a prescription for Vitamin D, Ergocalciferol, (DRISDOL) 1.25 MG (50000 UNIT) CAPS capsule every week #4 with 0 refills and will follow-up for routine testing of Vitamin D, at least 2-3 times per year to avoid over-replacement.   Type 2 diabetes mellitus without complication, with long-term current use of insulin (Alicia Lowery). Good blood sugar control is important to decrease the likelihood of diabetic complications such as nephropathy, neuropathy, limb loss, blindness, coronary artery disease, and death. Intensive lifestyle modification including diet, exercise and weight loss are the first line of treatment for diabetes. Jailene will decrease carbohydrates and increase healthy fats and protein.  Hyperlipidemia associated with type 2 diabetes mellitus (Alicia Lowery). Cardiovascular risk and specific lipid/LDL goals reviewed.  We discussed several lifestyle modifications today and Alicia Lowery will  continue to work on diet, exercise and weight loss efforts. Orders and follow up as documented in patient record. Alicia Lowery will continue her Crestor as directed.  Counseling Intensive lifestyle modifications are the first line treatment for this issue. . Dietary changes: Increase soluble fiber. Decrease simple carbohydrates. . Exercise changes: Moderate to vigorous-intensity aerobic activity 150 minutes per week if tolerated. . Lipid-lowering medications: see documented in medical record.  At risk for  osteoporosis. Alicia Lowery was given approximately 15 minutes of osteoporosis prevention counseling today. Alicia Lowery is at risk for osteopenia and osteoporosis due to her Vitamin D deficiency. She was encouraged to take her Vitamin D and follow her higher calcium diet and increase strengthening exercise to help strengthen her bones and decrease her risk of osteopenia and osteoporosis.  Repetitive spaced learning was employed today to elicit superior memory formation and behavioral change.  Class 3 severe obesity with serious comorbidity and body mass index (BMI) of 40.0 to 44.9 in adult, unspecified obesity type (HCC).  Alicia Lowery is currently in the action stage of change. As such, her goal is to continue with weight loss efforts. She has agreed to the Category 1 Plan.   We reviewed lab results from 06/23/2019 with the patient including CMP, lipids, Vitamin D, and C-peptide.  She will work on meal planning.  Exercise goals: Alicia Lowery will continue to walk.  Behavioral modification strategies: increasing lean protein intake, decreasing simple carbohydrates, increasing vegetables, increasing water intake, decreasing eating out, no skipping meals, meal planning and cooking strategies and keeping healthy foods in the home.  Alicia Lowery has agreed to follow-up with our clinic in 2 weeks. She was informed of the importance of frequent follow-up visits to maximize her success with intensive lifestyle modifications for her multiple health conditions.   Objective:   Blood pressure (!) 102/59, pulse 79, temperature 98.5 F (36.9 C), temperature source Oral, height 5\' 2"  (1.575 m), weight 239 lb (108.4 kg), SpO2 97 %. Body mass index is 43.71 kg/m.  General: Cooperative, alert, well developed, in no acute distress. HEENT: Conjunctivae and lids unremarkable. Cardiovascular: Regular rhythm.  Lungs: Normal work of breathing. Neurologic: No focal deficits.   Lab Results  Component Value Date   CREATININE 0.94  06/23/2019   BUN 13 06/23/2019   NA 141 06/23/2019   K 4.2 06/23/2019   CL 100 06/23/2019   CO2 25 06/23/2019   Lab Results  Component Value Date   ALT 29 06/23/2019   AST 19 06/23/2019   ALKPHOS 75 06/23/2019   BILITOT 1.1 06/23/2019   Lab Results  Component Value Date   HGBA1C 7.1 (A) 06/23/2019   HGBA1C 7.1 (A) 02/12/2019   HGBA1C 7.3 (A) 10/13/2018   HGBA1C 9.2 (A) 06/11/2018   HGBA1C 7.0 04/16/2017   Lab Results  Component Value Date   INSULIN 17.6 06/24/2019   Lab Results  Component Value Date   TSH 1.280 06/24/2019   Lab Results  Component Value Date   CHOL 247 (H) 06/23/2019   HDL 46 06/23/2019   LDLCALC 179 (H) 06/23/2019   TRIG 123 06/23/2019   CHOLHDL 5.4 (H) 06/23/2019   Lab Results  Component Value Date   WBC 3.7 06/23/2019   HGB 13.1 06/23/2019   HCT 39.4 06/23/2019   MCV 91 06/23/2019   PLT 233 06/23/2019   No results found for: IRON, TIBC, FERRITIN  Attestation Statements:   Reviewed by clinician on day of visit: allergies, medications, problem list, medical history, surgical history, family history, social history, and  previous encounter notes.  Fernanda Drum, am acting as Energy manager for [employer], DO   I have reviewed the above documentation for accuracy and completeness, and I agree with the above. Corinna Capra, DO

## 2019-07-09 LAB — PTH, INTACT AND CALCIUM
Calcium: 9.9 mg/dL (ref 8.7–10.3)
PTH: 42 pg/mL (ref 15–65)

## 2019-07-14 ENCOUNTER — Other Ambulatory Visit: Payer: Self-pay | Admitting: Family Medicine

## 2019-07-14 DIAGNOSIS — I152 Hypertension secondary to endocrine disorders: Secondary | ICD-10-CM

## 2019-07-14 DIAGNOSIS — E1159 Type 2 diabetes mellitus with other circulatory complications: Secondary | ICD-10-CM

## 2019-07-18 ENCOUNTER — Other Ambulatory Visit: Payer: Self-pay | Admitting: Family Medicine

## 2019-07-18 DIAGNOSIS — E1169 Type 2 diabetes mellitus with other specified complication: Secondary | ICD-10-CM

## 2019-07-18 DIAGNOSIS — E785 Hyperlipidemia, unspecified: Secondary | ICD-10-CM

## 2019-07-21 ENCOUNTER — Encounter (INDEPENDENT_AMBULATORY_CARE_PROVIDER_SITE_OTHER): Payer: Self-pay | Admitting: Bariatrics

## 2019-07-21 NOTE — Telephone Encounter (Signed)
Please review

## 2019-07-23 ENCOUNTER — Other Ambulatory Visit: Payer: Self-pay

## 2019-07-23 ENCOUNTER — Encounter (INDEPENDENT_AMBULATORY_CARE_PROVIDER_SITE_OTHER): Payer: Self-pay | Admitting: Bariatrics

## 2019-07-23 ENCOUNTER — Ambulatory Visit (INDEPENDENT_AMBULATORY_CARE_PROVIDER_SITE_OTHER): Payer: BC Managed Care – PPO | Admitting: Bariatrics

## 2019-07-23 VITALS — BP 147/78 | HR 74 | Temp 98.0°F | Ht 62.0 in | Wt 235.0 lb

## 2019-07-23 DIAGNOSIS — E1169 Type 2 diabetes mellitus with other specified complication: Secondary | ICD-10-CM | POA: Diagnosis not present

## 2019-07-23 DIAGNOSIS — E559 Vitamin D deficiency, unspecified: Secondary | ICD-10-CM

## 2019-07-23 DIAGNOSIS — E669 Obesity, unspecified: Secondary | ICD-10-CM

## 2019-07-23 DIAGNOSIS — Z9189 Other specified personal risk factors, not elsewhere classified: Secondary | ICD-10-CM | POA: Diagnosis not present

## 2019-07-23 DIAGNOSIS — Z6841 Body Mass Index (BMI) 40.0 and over, adult: Secondary | ICD-10-CM

## 2019-07-23 MED ORDER — VITAMIN D (ERGOCALCIFEROL) 1.25 MG (50000 UNIT) PO CAPS: 50000.0000 [IU] | ORAL_CAPSULE | ORAL | 0 refills | Status: DC

## 2019-07-23 NOTE — Progress Notes (Signed)
Chief Complaint:   OBESITY Alicia Lowery is here to discuss her progress with her obesity treatment plan along with follow-up of her obesity related diagnoses. Alicia Lowery is on the Category 1 Plan and states she is following her eating plan approximately 75% of the time. [name] states she is walking 10 minutes 3 times per week.  Today's visit was #: 3 Starting weight: 246 lbs Starting date: 06/24/2019 Today's weight: 235 lbs Today's date: 07/23/2019 Total lbs lost to date: 11 Total lbs lost since last in-office visit: 4  Interim History: Alicia Lowery is down 4 lbs from her last visit. She had dental surgery and was not able to eat all of her protein.  Subjective:   Type 2 diabetes mellitus with obesity (HCC). Alicia Lowery is taking metformin and Ozempic. She denies any lows.  Lab Results  Component Value Date   HGBA1C 7.1 (A) 06/23/2019   HGBA1C 7.1 (A) 02/12/2019   HGBA1C 7.3 (A) 10/13/2018   Lab Results  Component Value Date   MICROALBUR <5.0 06/23/2019   LDLCALC 179 (H) 06/23/2019   CREATININE 0.94 06/23/2019   Lab Results  Component Value Date   INSULIN 17.6 06/24/2019   Hypercalcemia. [name] has a low Vitamin and is taking Vitamin D supplementation. Her calcium was previously 10.6 on 06/23/2019, is now back to normal at 9.9 on 07/08/2019.  Vitamin D deficiency. No nausea, vomiting, or muscle weakness. Last Vitamin D 14.7 on 06/24/2019.  At risk for impaired metabolic function. Alicia Lowery is at increased risk for impaired metabolic function due to low RMR.  Assessment/Plan:   Type 2 diabetes mellitus with obesity (HCC). Good blood sugar control is important to decrease the likelihood of diabetic complications such as nephropathy, neuropathy, limb loss, blindness, coronary artery disease, and death. Intensive lifestyle modification including diet, exercise and weight loss are the first line of treatment for diabetes. Sadeen will continue her medications as directed.  Hypercalcemia.  Will continue to watch over time.  Vitamin D deficiency. Low Vitamin D level contributes to fatigue and are associated with obesity, breast, and colon cancer. She was given a prescription for Vitamin D, Ergocalciferol, (DRISDOL) 1.25 MG (50000 UNIT) CAPS capsule every week #4 with 0 refills and will follow-up for routine testing of Vitamin D, at least 2-3 times per year to avoid over-replacement.    At risk for impaired metabolic function. Alicia Lowery was given approximately 15 minutes of impaired  metabolic function prevention counseling today. We discussed intensive lifestyle modifications today with an emphasis on specific nutrition and exercise instructions and strategies.   Repetitive spaced learning was employed today to elicit superior memory formation and behavioral change.  Class 3 severe obesity with serious comorbidity and body mass index (BMI) of 40.0 to 44.9 in adult, unspecified obesity type (HCC).  Alicia Lowery is currently in the action stage of change. As such, her goal is to continue with weight loss efforts. She has agreed to the Category 1 Plan.   She will be more adherent with her plan and will decrease carbohydrates.  Exercise goals: Alicia Lowery will continue walking 10 minutes 3 times a week and will increase over time.  Behavioral modification strategies: increasing lean protein intake, decreasing simple carbohydrates, increasing vegetables, increasing water intake, decreasing eating out, no skipping meals, meal planning and cooking strategies, keeping healthy foods in the home and planning for success.  Alicia Lowery has agreed to follow-up with our clinic in 2 weeks. She was informed of the importance of frequent follow-up visits to maximize  her success with intensive lifestyle modifications for her multiple health conditions.   Objective:   Blood pressure (!) 147/78, pulse 74, temperature 98 F (36.7 C), height 5\' 2"  (1.575 m), weight 235 lb (106.6 kg), SpO2 95 %. Body mass index is 42.98  kg/m.  General: Cooperative, alert, well developed, in no acute distress. HEENT: Conjunctivae and lids unremarkable. Cardiovascular: Regular rhythm.  Lungs: Normal work of breathing. Neurologic: No focal deficits.   Lab Results  Component Value Date   CREATININE 0.94 06/23/2019   BUN 13 06/23/2019   NA 141 06/23/2019   K 4.2 06/23/2019   CL 100 06/23/2019   CO2 25 06/23/2019   Lab Results  Component Value Date   ALT 29 06/23/2019   AST 19 06/23/2019   ALKPHOS 75 06/23/2019   BILITOT 1.1 06/23/2019   Lab Results  Component Value Date   HGBA1C 7.1 (A) 06/23/2019   HGBA1C 7.1 (A) 02/12/2019   HGBA1C 7.3 (A) 10/13/2018   HGBA1C 9.2 (A) 06/11/2018   HGBA1C 7.0 04/16/2017   Lab Results  Component Value Date   INSULIN 17.6 06/24/2019   Lab Results  Component Value Date   TSH 1.280 06/24/2019   Lab Results  Component Value Date   CHOL 247 (H) 06/23/2019   HDL 46 06/23/2019   LDLCALC 179 (H) 06/23/2019   TRIG 123 06/23/2019   CHOLHDL 5.4 (H) 06/23/2019   Lab Results  Component Value Date   WBC 3.7 06/23/2019   HGB 13.1 06/23/2019   HCT 39.4 06/23/2019   MCV 91 06/23/2019   PLT 233 06/23/2019   No results found for: IRON, TIBC, FERRITIN  Attestation Statements:   Reviewed by clinician on day of visit: allergies, medications, problem list, medical history, surgical history, family history, social history, and previous encounter notes.  Migdalia Dk, am acting as Location manager for [employer], DO   I have reviewed the above documentation for accuracy and completeness, and I agree with the above. Jearld Lesch, DO

## 2019-07-31 ENCOUNTER — Ambulatory Visit: Payer: Self-pay | Attending: Internal Medicine

## 2019-07-31 DIAGNOSIS — Z23 Encounter for immunization: Secondary | ICD-10-CM

## 2019-07-31 NOTE — Progress Notes (Signed)
   Covid-19 Vaccination Clinic  Name:  Alicia Lowery    MRN: [id number] DOB: 09-18-1960  07/31/2019  [name] was observed post Covid-19 immunization for 15 minutes without incident. She was provided with Vaccine Information Sheet and instruction to access the V-Safe system.   [name] was instructed to call 911 with any severe reactions post vaccine: Marland Kitchen Difficulty breathing  . Swelling of face and throat  . A fast heartbeat  . A bad rash all over body  . Dizziness and weakness   Immunizations Administered    Name Date Dose VIS Date Route   Pfizer COVID-19 Vaccine 07/31/2019  8:44 AM 0.3 mL 04/24/2019 Intramuscular   Manufacturer: [employer], [employer]   Lot: [id number]   NDC: [address]-1

## 2019-08-08 ENCOUNTER — Other Ambulatory Visit: Payer: Self-pay | Admitting: Family Medicine

## 2019-08-11 ENCOUNTER — Other Ambulatory Visit: Payer: Self-pay

## 2019-08-11 MED ORDER — ACCU-CHEK GUIDE VI STRP: 1.0000 | ORAL_STRIP | Freq: Three times a day (TID) | 4 refills | Status: DC

## 2019-08-13 ENCOUNTER — Other Ambulatory Visit: Payer: Self-pay

## 2019-08-13 ENCOUNTER — Ambulatory Visit (INDEPENDENT_AMBULATORY_CARE_PROVIDER_SITE_OTHER): Payer: BC Managed Care – PPO | Admitting: Bariatrics

## 2019-08-13 ENCOUNTER — Encounter (INDEPENDENT_AMBULATORY_CARE_PROVIDER_SITE_OTHER): Payer: Self-pay | Admitting: Bariatrics

## 2019-08-13 DIAGNOSIS — E1159 Type 2 diabetes mellitus with other circulatory complications: Secondary | ICD-10-CM

## 2019-08-13 DIAGNOSIS — I152 Hypertension secondary to endocrine disorders: Secondary | ICD-10-CM

## 2019-08-13 DIAGNOSIS — I10 Essential (primary) hypertension: Secondary | ICD-10-CM

## 2019-08-13 DIAGNOSIS — Z6841 Body Mass Index (BMI) 40.0 and over, adult: Secondary | ICD-10-CM

## 2019-08-13 NOTE — Progress Notes (Signed)
Chief Complaint:   OBESITY Alicia Lowery is here to discuss her progress with her obesity treatment plan along with follow-up of her obesity related diagnoses. [name] is on the Category 1 Plan and states she is following her eating plan approximately 92% of the time. Shalene states she is walking 30 minutes 3 times per week.  Today's visit was #: 4 Starting weight: 246 lbs Starting date: 06/24/2019 Today's weight: 233 lbs Today's date: 08/13/2019 Total lbs lost to date: 13 Total lbs lost since last in-office visit: 2  Interim History: Alicia Lowery is down 2 lbs. She reports getting adequate water intake.  Subjective:   Hypertension associated with diabetes ([place]). Blood pressure is under good control. A1c 7.1 on 06/23/2019 with an insulin of 17.6 on 06/24/2019.  BP Readings from Last 3 Encounters:  08/13/19 124/78  07/23/19 (!) 147/78  07/08/19 (!) 102/59   Lab Results  Component Value Date   CREATININE 0.94 06/23/2019   CREATININE 0.72 06/11/2018   CREATININE 0.79 04/16/2017   Hypercalcemia. Calcium 9.9 on 07/08/2019, now within normal limits; previous value was 10.6 on 06/23/2019.  Assessment/Plan:   Hypertension associated with diabetes ([place]). Alicia Lowery is working on healthy weight loss and exercise to improve blood pressure control. We will watch for signs of hypotension as she continues her lifestyle modifications. She will continue her medications as directed.  Hypercalcemia. Will watch over time.  Class 3 severe obesity with serious comorbidity and body mass index (BMI) of 40.0 to 44.9 in adult, unspecified obesity type ([place]).  Alicia Lowery is currently in the action stage of change. As such, her goal is to continue with weight loss efforts. She has agreed to the Category 1 Plan.   She will work on meal planning, intentional eating, and will weight her meat.  Exercise goals: Alicia Lowery will continue walking 30 minutes 3 times per week.  Behavioral modification strategies:  increasing lean protein intake, decreasing simple carbohydrates, increasing vegetables, increasing water intake, decreasing eating out, no skipping meals, meal planning and cooking strategies, keeping healthy foods in the home and planning for success.  Alicia Lowery has agreed to follow-up with our clinic in 2 weeks. She was informed of the importance of frequent follow-up visits to maximize her success with intensive lifestyle modifications for her multiple health conditions.   Objective:   Blood pressure 124/78, pulse 81, temperature 98 F (36.7 C), height 5\' 2"  (1.575 m), weight 233 lb (105.7 kg), SpO2 95 %. Body mass index is 42.62 kg/m.  General: Cooperative, alert, well developed, in no acute distress. HEENT: Conjunctivae and lids unremarkable. Cardiovascular: Regular rhythm.  Lungs: Normal work of breathing. Neurologic: No focal deficits.   Lab Results  Component Value Date   CREATININE 0.94 06/23/2019   BUN 13 06/23/2019   NA 141 06/23/2019   K 4.2 06/23/2019   CL 100 06/23/2019   CO2 25 06/23/2019   Lab Results  Component Value Date   ALT 29 06/23/2019   AST 19 06/23/2019   ALKPHOS 75 06/23/2019   BILITOT 1.1 06/23/2019   Lab Results  Component Value Date   HGBA1C 7.1 (A) 06/23/2019   HGBA1C 7.1 (A) 02/12/2019   HGBA1C 7.3 (A) 10/13/2018   HGBA1C 9.2 (A) 06/11/2018   HGBA1C 7.0 04/16/2017   Lab Results  Component Value Date   INSULIN 17.6 06/24/2019   Lab Results  Component Value Date   TSH 1.280 06/24/2019   Lab Results  Component Value Date   CHOL 247 (H) 06/23/2019  HDL 46 06/23/2019   LDLCALC 179 (H) 06/23/2019   TRIG 123 06/23/2019   CHOLHDL 5.4 (H) 06/23/2019   Lab Results  Component Value Date   WBC 3.7 06/23/2019   HGB 13.1 06/23/2019   HCT 39.4 06/23/2019   MCV 91 06/23/2019   PLT 233 06/23/2019   No results found for: IRON, TIBC, FERRITIN  Attestation Statements:   Reviewed by clinician on day of visit: allergies, medications, problem  list, medical history, surgical history, family history, social history, and previous encounter notes.  Time spent on visit including pre-visit chart review and post-visit charting and care was 20 minutes.   Fernanda Drum, am acting as Energy manager for [employer], DO   I have reviewed the above documentation for accuracy and completeness, and I agree with the above. Corinna Capra, DO

## 2019-08-25 ENCOUNTER — Ambulatory Visit: Payer: Self-pay | Attending: Internal Medicine

## 2019-08-25 DIAGNOSIS — Z23 Encounter for immunization: Secondary | ICD-10-CM

## 2019-08-25 NOTE — Progress Notes (Signed)
   Covid-19 Vaccination Clinic  Name:  JEHIELI BRASSELL    MRN: [id number] DOB: 1961/02/11  08/25/2019  Ms. Denes was observed post Covid-19 immunization for 15 minutes without incident. She was provided with Vaccine Information Sheet and instruction to access the V-Safe system.   Ms. Zavaleta was instructed to call 911 with any severe reactions post vaccine: Marland Kitchen Difficulty breathing  . Swelling of face and throat  . A fast heartbeat  . A bad rash all over body  . Dizziness and weakness   Immunizations Administered    Name Date Dose VIS Date Route   Pfizer COVID-19 Vaccine 08/25/2019  9:08 AM 0.3 mL 04/24/2019 Intramuscular   Manufacturer: [employer], [employer]   Lot: [id number]   NDC: [address]-3

## 2019-08-28 ENCOUNTER — Other Ambulatory Visit (INDEPENDENT_AMBULATORY_CARE_PROVIDER_SITE_OTHER): Payer: Self-pay | Admitting: Bariatrics

## 2019-08-28 DIAGNOSIS — E559 Vitamin D deficiency, unspecified: Secondary | ICD-10-CM

## 2019-08-28 IMAGING — DX DG HUMERUS 2V *R*
2 series · 2 of 2 positions shown · non-contrast
Comparison: None.

CLINICAL DATA: Right upper arm pain.

EXAM:
RIGHT HUMERUS - 2+ VIEW

[humerus ap]
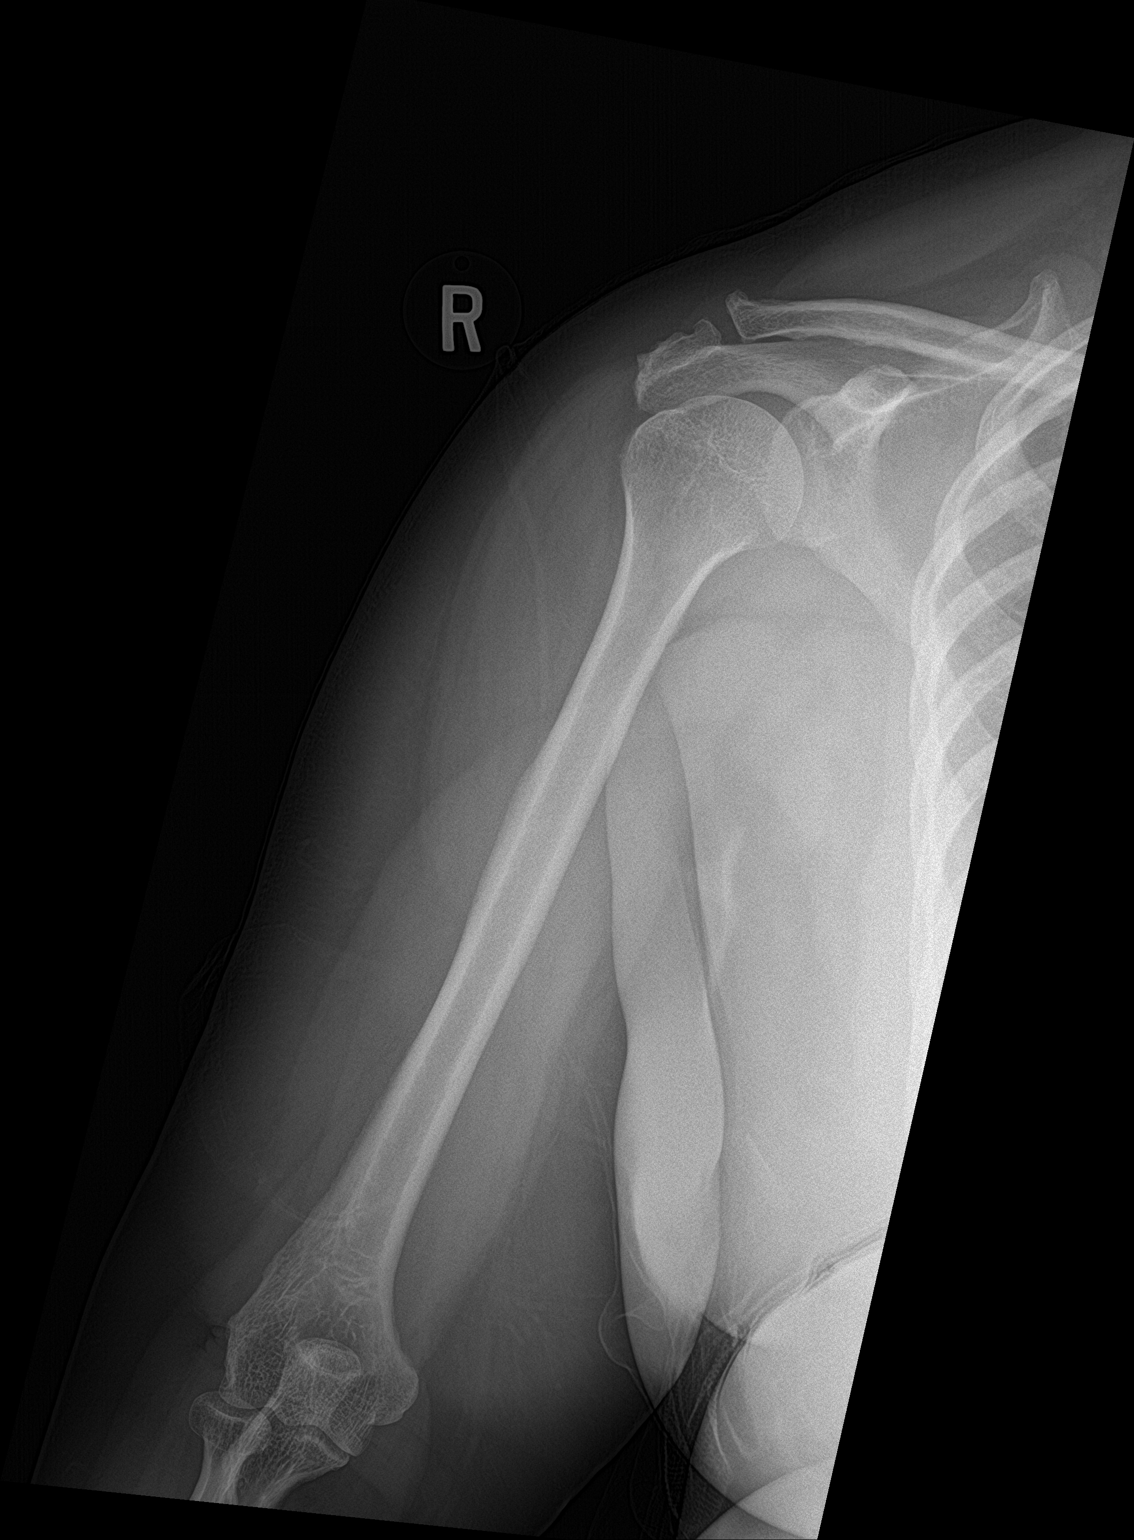

[humerus lat]
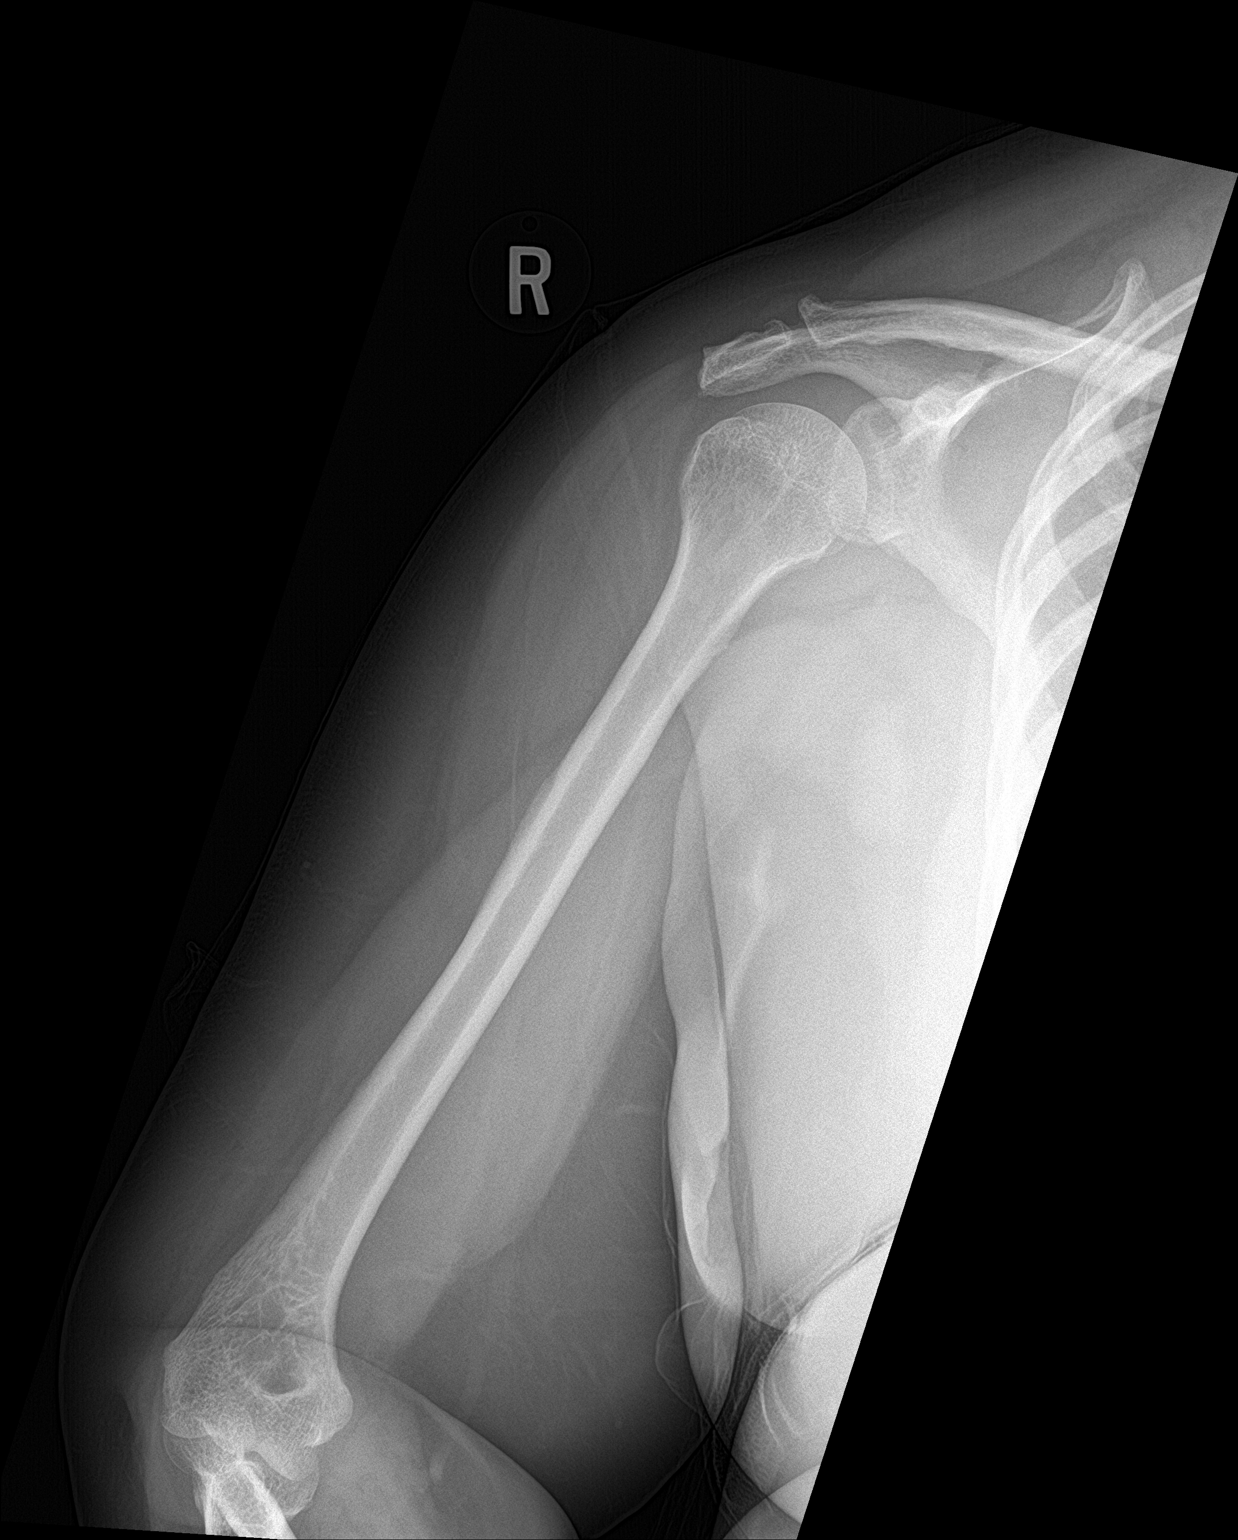

[2 of 2 positions shown; findings below may reference images not displayed]

FINDINGS: There is no evidence of fracture or other focal bone lesions. Soft
tissues are unremarkable.
IMPRESSION: Negative.

## 2019-08-29 ENCOUNTER — Other Ambulatory Visit: Payer: Self-pay | Admitting: Family Medicine

## 2019-08-29 DIAGNOSIS — M109 Gout, unspecified: Secondary | ICD-10-CM

## 2019-08-31 NOTE — Telephone Encounter (Signed)
Please advise due to pt just being started on Med. [facility]

## 2019-09-03 ENCOUNTER — Other Ambulatory Visit: Payer: Self-pay

## 2019-09-03 ENCOUNTER — Ambulatory Visit (INDEPENDENT_AMBULATORY_CARE_PROVIDER_SITE_OTHER): Payer: BC Managed Care – PPO | Admitting: Bariatrics

## 2019-09-03 ENCOUNTER — Encounter (INDEPENDENT_AMBULATORY_CARE_PROVIDER_SITE_OTHER): Payer: Self-pay | Admitting: Bariatrics

## 2019-09-03 VITALS — BP 105/75 | HR 81 | Temp 98.4°F | Ht 62.0 in | Wt 227.0 lb

## 2019-09-03 DIAGNOSIS — I10 Essential (primary) hypertension: Secondary | ICD-10-CM

## 2019-09-03 DIAGNOSIS — Z9189 Other specified personal risk factors, not elsewhere classified: Secondary | ICD-10-CM

## 2019-09-03 DIAGNOSIS — E559 Vitamin D deficiency, unspecified: Secondary | ICD-10-CM | POA: Diagnosis not present

## 2019-09-03 DIAGNOSIS — Z6841 Body Mass Index (BMI) 40.0 and over, adult: Secondary | ICD-10-CM

## 2019-09-03 MED ORDER — VITAMIN D (ERGOCALCIFEROL) 1.25 MG (50000 UNIT) PO CAPS: 50000.0000 [IU] | ORAL_CAPSULE | ORAL | 0 refills | Status: DC

## 2019-09-03 NOTE — Progress Notes (Signed)
Chief Complaint:   OBESITY [name] is here to discuss her progress with her obesity treatment plan along with follow-up of her obesity related diagnoses. Alicia Lowery is on the Category 1 Plan and states she is following her eating plan approximately 90% of the time. Alicia Lowery states she is walking 20 minutes 3 times per week.  Today's visit was #: 5 Starting weight: 246 lbs Starting date: 06/24/2019 Today's weight: 227 lbs Today's date: 09/03/2019 Total lbs lost to date: 19 Total lbs lost since last in-office visit: 6  Interim History: Alicia Lowery is down 6 lbs and doing well.  Subjective:   Vitamin D deficiency. No nausea, vomiting, or muscle weakness. Last Vitamin D 14.7 on 06/24/2019.  Essential hypertension. Alicia Lowery is taking Zestoretic. Blood pressure is controlled.  BP Readings from Last 3 Encounters:  09/03/19 105/75  08/13/19 124/78  07/23/19 (!) 147/78   Lab Results  Component Value Date   CREATININE 0.94 06/23/2019   CREATININE 0.72 06/11/2018   CREATININE 0.79 04/16/2017   At risk for osteoporosis. Alicia Lowery is at higher risk of osteopenia and osteoporosis due to Vitamin D deficiency.   Assessment/Plan:   Vitamin D deficiency. Low Vitamin D level contributes to fatigue and are associated with obesity, breast, and colon cancer. She was given a prescription for  Vitamin D, Ergocalciferol, (DRISDOL) 1.25 MG (50000 UNIT) CAPS capsule every week #4 with 0 refills and will follow-up for routine testing of Vitamin D, at least 2-3 times per year to avoid over-replacement.    Essential hypertension. Alicia Lowery is working on healthy weight loss and exercise to improve blood pressure control. We will watch for signs of hypotension as she continues her lifestyle modifications. She will continue her medication as directed.  At risk for osteoporosis. Alicia Lowery was given approximately 15 minutes of osteoporosis prevention counseling today. Alicia Lowery is at risk for osteopenia and osteoporosis  due to her Vitamin D deficiency. She was encouraged to take her Vitamin D and follow her higher calcium diet and increase strengthening exercise to help strengthen her bones and decrease her risk of osteopenia and osteoporosis.  Repetitive spaced learning was employed today to elicit superior memory formation and behavioral change.  Class 3 severe obesity with serious comorbidity and body mass index (BMI) of 40.0 to 44.9 in adult, unspecified obesity type (Alicia Lowery).  Alicia Lowery is currently in the action stage of change. As such, her goal is to continue with weight loss efforts. She has agreed to the Category 1 Plan.   She will work on meal planning and keeping her water intake high.  Exercise goals: Alicia Lowery will continue walking and increase over time.  Behavioral modification strategies: increasing lean protein intake, decreasing simple carbohydrates, increasing vegetables, increasing water intake, decreasing eating out, no skipping meals, meal planning and cooking strategies, keeping healthy foods in the home and planning for success.  Alicia Lowery has agreed to follow-up with our clinic in 2 weeks. She was informed of the importance of frequent follow-up visits to maximize her success with intensive lifestyle modifications for her multiple health conditions.   Objective:   Blood pressure 105/75, pulse 81, temperature 98.4 F (36.9 C), height 5\' 2"  (1.575 m), weight 227 lb (103 kg), SpO2 97 %. Body mass index is 41.52 kg/m.  General: Cooperative, alert, well developed, in no acute distress. HEENT: Conjunctivae and lids unremarkable. Cardiovascular: Regular rhythm.  Lungs: Normal work of breathing. Neurologic: No focal deficits.   Lab Results  Component Value Date   CREATININE 0.94  06/23/2019   BUN 13 06/23/2019   NA 141 06/23/2019   K 4.2 06/23/2019   CL 100 06/23/2019   CO2 25 06/23/2019   Lab Results  Component Value Date   ALT 29 06/23/2019   AST 19 06/23/2019   ALKPHOS 75 06/23/2019    BILITOT 1.1 06/23/2019   Lab Results  Component Value Date   HGBA1C 7.1 (A) 06/23/2019   HGBA1C 7.1 (A) 02/12/2019   HGBA1C 7.3 (A) 10/13/2018   HGBA1C 9.2 (A) 06/11/2018   HGBA1C 7.0 04/16/2017   Lab Results  Component Value Date   INSULIN 17.6 06/24/2019   Lab Results  Component Value Date   TSH 1.280 06/24/2019   Lab Results  Component Value Date   CHOL 247 (H) 06/23/2019   HDL 46 06/23/2019   LDLCALC 179 (H) 06/23/2019   TRIG 123 06/23/2019   CHOLHDL 5.4 (H) 06/23/2019   Lab Results  Component Value Date   WBC 3.7 06/23/2019   HGB 13.1 06/23/2019   HCT 39.4 06/23/2019   MCV 91 06/23/2019   PLT 233 06/23/2019   No results found for: IRON, TIBC, FERRITIN  Attestation Statements:   Reviewed by clinician on day of visit: allergies, medications, problem list, medical history, surgical history, family history, social history, and previous encounter notes.  Fernanda Drum, am acting as Energy manager for [employer], DO   I have reviewed the above documentation for accuracy and completeness, and I agree with the above. Corinna Capra, DO

## 2019-09-07 ENCOUNTER — Encounter (INDEPENDENT_AMBULATORY_CARE_PROVIDER_SITE_OTHER): Payer: Self-pay | Admitting: Bariatrics

## 2019-09-22 ENCOUNTER — Ambulatory Visit (INDEPENDENT_AMBULATORY_CARE_PROVIDER_SITE_OTHER): Payer: BC Managed Care – PPO | Admitting: Bariatrics

## 2019-09-22 ENCOUNTER — Other Ambulatory Visit: Payer: Self-pay

## 2019-09-22 ENCOUNTER — Encounter (INDEPENDENT_AMBULATORY_CARE_PROVIDER_SITE_OTHER): Payer: Self-pay | Admitting: Bariatrics

## 2019-09-22 VITALS — BP 101/63 | HR 82 | Temp 97.9°F | Ht 62.0 in | Wt 227.0 lb

## 2019-09-22 DIAGNOSIS — E785 Hyperlipidemia, unspecified: Secondary | ICD-10-CM

## 2019-09-22 DIAGNOSIS — E1169 Type 2 diabetes mellitus with other specified complication: Secondary | ICD-10-CM | POA: Diagnosis not present

## 2019-09-22 DIAGNOSIS — I10 Essential (primary) hypertension: Secondary | ICD-10-CM

## 2019-09-22 DIAGNOSIS — Z6841 Body Mass Index (BMI) 40.0 and over, adult: Secondary | ICD-10-CM

## 2019-09-22 NOTE — Progress Notes (Signed)
Chief Complaint:   OBESITY Alicia Lowery is here to discuss her progress with her obesity treatment plan along with follow-up of her obesity related diagnoses. Alicia Lowery is on the Category 1 Plan and states she is following her eating plan approximately 95% of the time. Alicia Lowery states she is walking 20 minutes 3 times per week.  Today's visit was #: 6 Starting weight: 246 lbs Starting date: 06/24/2019 Today's weight: 227 lbs Today's date: 09/22/2019 Total lbs lost to date: 19 Total lbs lost since last in-office visit: 0  Interim History: Alicia Lowery weight remains the same since her last visit. She reports drinking more water.  Subjective:   Essential hypertension. Alicia Lowery is taking Zestoretic.  BP Readings from Last 3 Encounters:  09/03/19 105/75  08/13/19 124/78  07/23/19 (!) 147/78   Lab Results  Component Value Date   CREATININE 0.94 06/23/2019   CREATININE 0.72 06/11/2018   CREATININE 0.79 04/16/2017   Hyperlipidemia associated with type 2 diabetes mellitus ([place]). Alicia Lowery is taking Crestor.  Lab Results  Component Value Date   CHOL 247 (H) 06/23/2019   HDL 46 06/23/2019   LDLCALC 179 (H) 06/23/2019   TRIG 123 06/23/2019   CHOLHDL 5.4 (H) 06/23/2019   Lab Results  Component Value Date   ALT 29 06/23/2019   AST 19 06/23/2019   ALKPHOS 75 06/23/2019   BILITOT 1.1 06/23/2019   The 10-year ASCVD risk score Mikey Bussing DC Jr., et al., 2013) is: 12%   Values used to calculate the score:     Age: 59 years     Sex: Female     Is Non-Hispanic African American: Yes     Diabetic: Yes     Tobacco smoker: No     Systolic Blood Pressure: 831 mmHg     Is BP treated: Yes     HDL Cholesterol: 46 mg/dL     Total Cholesterol: 247 mg/dL  Assessment/Plan:   Essential hypertension. Alicia Lowery is working on healthy weight loss and exercise to improve blood pressure control. We will watch for signs of hypotension as she continues her lifestyle modifications. She will continue her  medication as directed.  Hyperlipidemia associated with type 2 diabetes mellitus ([place]). Cardiovascular risk and specific lipid/LDL goals reviewed.  We discussed several lifestyle modifications today and Alicia Lowery will continue to work on diet, exercise and weight loss efforts. Orders and follow up as documented in patient record.  Alicia Lowery will continue her medication as directed. She will increase PUFA's and MUFA's and will decrease saturated fats.   Counseling Intensive lifestyle modifications are the first line treatment for this issue. . Dietary changes: Increase soluble fiber. Decrease simple carbohydrates. . Exercise changes: Moderate to vigorous-intensity aerobic activity 150 minutes per week if tolerated. . Lipid-lowering medications: see documented in medical record.  Class 3 severe obesity with serious comorbidity and body mass index (BMI) of 40.0 to 44.9 in adult, unspecified obesity type (Alicia Lowery).  Alicia Lowery is currently in the action stage of change. As such, her goal is to continue with weight loss efforts. She has agreed to the Category 1 Plan.   She will work on meal planning and intentional eating. Handout was given on Protein Equivalents.  Exercise goals: Alicia Lowery is walking and will continue.  Behavioral modification strategies: increasing lean protein intake, decreasing simple carbohydrates, increasing vegetables, increasing water intake, decreasing eating out, no skipping meals, meal planning and cooking strategies, keeping healthy foods in the home and ways to avoid boredom eating.  Alicia Lowery has  agreed to follow-up with our clinic in 2 weeks. She was informed of the importance of frequent follow-up visits to maximize her success with intensive lifestyle modifications for her multiple health conditions.   Objective:   Pulse 82, temperature 97.9 F (36.6 C), height 5\' 2"  (1.575 m), weight 227 lb (103 kg), SpO2 98 %. Body mass index is 41.52 kg/m.  General: Cooperative, alert, well  developed, in no acute distress. HEENT: Conjunctivae and lids unremarkable. Cardiovascular: Regular rhythm.  Lungs: Normal work of breathing. Neurologic: No focal deficits.   Lab Results  Component Value Date   CREATININE 0.94 06/23/2019   BUN 13 06/23/2019   NA 141 06/23/2019   K 4.2 06/23/2019   CL 100 06/23/2019   CO2 25 06/23/2019   Lab Results  Component Value Date   ALT 29 06/23/2019   AST 19 06/23/2019   ALKPHOS 75 06/23/2019   BILITOT 1.1 06/23/2019   Lab Results  Component Value Date   HGBA1C 7.1 (A) 06/23/2019   HGBA1C 7.1 (A) 02/12/2019   HGBA1C 7.3 (A) 10/13/2018   HGBA1C 9.2 (A) 06/11/2018   HGBA1C 7.0 04/16/2017   Lab Results  Component Value Date   INSULIN 17.6 06/24/2019   Lab Results  Component Value Date   TSH 1.280 06/24/2019   Lab Results  Component Value Date   CHOL 247 (H) 06/23/2019   HDL 46 06/23/2019   LDLCALC 179 (H) 06/23/2019   TRIG 123 06/23/2019   CHOLHDL 5.4 (H) 06/23/2019   Lab Results  Component Value Date   WBC 3.7 06/23/2019   HGB 13.1 06/23/2019   HCT 39.4 06/23/2019   MCV 91 06/23/2019   PLT 233 06/23/2019   No results found for: IRON, TIBC, FERRITIN  Attestation Statements:   Reviewed by clinician on day of visit: allergies, medications, problem list, medical history, surgical history, family history, social history, and previous encounter notes.  Time spent on visit including pre-visit chart review and post-visit charting and care was 20 minutes.   08/21/2019, am acting as Fernanda Drum for Energy manager, DO   I have reviewed the above documentation for accuracy and completeness, and I agree with the above. [employer], DO

## 2019-09-23 ENCOUNTER — Encounter (INDEPENDENT_AMBULATORY_CARE_PROVIDER_SITE_OTHER): Payer: Self-pay | Admitting: Bariatrics

## 2019-10-15 ENCOUNTER — Other Ambulatory Visit: Payer: Self-pay

## 2019-10-15 ENCOUNTER — Ambulatory Visit (INDEPENDENT_AMBULATORY_CARE_PROVIDER_SITE_OTHER): Payer: BC Managed Care – PPO | Admitting: Bariatrics

## 2019-10-15 ENCOUNTER — Encounter (INDEPENDENT_AMBULATORY_CARE_PROVIDER_SITE_OTHER): Payer: Self-pay | Admitting: Bariatrics

## 2019-10-15 VITALS — BP 105/67 | HR 85 | Temp 98.1°F | Ht 62.0 in | Wt 226.0 lb

## 2019-10-15 DIAGNOSIS — Z6841 Body Mass Index (BMI) 40.0 and over, adult: Secondary | ICD-10-CM

## 2019-10-15 DIAGNOSIS — E119 Type 2 diabetes mellitus without complications: Secondary | ICD-10-CM

## 2019-10-15 DIAGNOSIS — E559 Vitamin D deficiency, unspecified: Secondary | ICD-10-CM

## 2019-10-15 DIAGNOSIS — E7849 Other hyperlipidemia: Secondary | ICD-10-CM | POA: Diagnosis not present

## 2019-10-15 DIAGNOSIS — Z9189 Other specified personal risk factors, not elsewhere classified: Secondary | ICD-10-CM

## 2019-10-15 MED ORDER — VITAMIN D (ERGOCALCIFEROL) 1.25 MG (50000 UNIT) PO CAPS: 50000.0000 [IU] | ORAL_CAPSULE | ORAL | 0 refills | Status: DC

## 2019-10-15 NOTE — Progress Notes (Signed)
Chief Complaint:   OBESITY Alicia Lowery is here to discuss her progress with her obesity treatment plan along with follow-up of her obesity related diagnoses. Alicia Lowery is on the Category 1 Plan and states she is following her eating plan approximately 90% of the time. [name] states she is walking 20-25 minutes 3 times per week.  Today's visit was #: 7 Starting weight: 246 lbs Starting date: 02/10//2021 Today's weight: 226 lbs Today's date: 10/15/2019 Total lbs lost to date: 20 Total lbs lost since last in-office visit: 1  Interim History: Alicia Lowery is down 1 lb.  Subjective:   Vitamin D deficiency. No nausea, vomiting, or muscle weakness. Last Vitamin D was 14.7 on 06/24/2019.  Other hyperlipidemia. Aubreanna is taking Crestor.  Lab Results  Component Value Date   CHOL 247 (H) 06/23/2019   HDL 46 06/23/2019   LDLCALC 179 (H) 06/23/2019   TRIG 123 06/23/2019   CHOLHDL 5.4 (H) 06/23/2019   Lab Results  Component Value Date   ALT 29 06/23/2019   AST 19 06/23/2019   ALKPHOS 75 06/23/2019   BILITOT 1.1 06/23/2019   The 10-year ASCVD risk score [name], et al., 2013) is: 12%   Values used to calculate the score:     Age: 81 years     Sex: Female     Is Non-Hispanic African American: Yes     Diabetic: Yes     Tobacco smoker: No     Systolic Blood Pressure: 105 mmHg     Is BP treated: Yes     HDL Cholesterol: 46 mg/dL     Total Cholesterol: 247 mg/dL  Type 2 diabetes mellitus without complication, without long-term current use of insulin (HCC).  Last A1c 7.1 on 06/23/2019.  Lab Results  Component Value Date   HGBA1C 7.1 (A) 06/23/2019   HGBA1C 7.1 (A) 02/12/2019   HGBA1C 7.3 (A) 10/13/2018   Lab Results  Component Value Date   MICROALBUR <5.0 06/23/2019   LDLCALC 179 (H) 06/23/2019   CREATININE 0.94 06/23/2019   Lab Results  Component Value Date   INSULIN 17.6 06/24/2019   At risk for heart disease. Alicia Lowery is at a higher than average risk for  cardiovascular disease due to hyperlipidemia and obesity.   Assessment/Plan:   Vitamin D deficiency. Low Vitamin D level contributes to fatigue and are associated with obesity, breast, and colon cancer. She was given a prescription for Vitamin D, Ergocalciferol, (DRISDOL) 1.25 MG (50000 UNIT) CAPS capsule every week #4 with 0 refills and will follow-up for routine testing of Vitamin D, at least 2-3 times per year to avoid over-replacement.   Other hyperlipidemia. Cardiovascular risk and specific lipid/LDL goals reviewed.  We discussed several lifestyle modifications today and [name] will continue to work on diet, exercise and weight loss efforts. Orders and follow up as documented in patient record. [name] will continue her Crestor as directed.  Counseling Intensive lifestyle modifications are the first line treatment for this issue. . Dietary changes: Increase soluble fiber. Decrease simple carbohydrates. . Exercise changes: Moderate to vigorous-intensity aerobic activity 150 minutes per week if tolerated. . Lipid-lowering medications: see documented in medical record.  Type 2 diabetes mellitus without complication, without long-term current use of insulin (HCC). Good blood sugar control is important to decrease the likelihood of diabetic complications such as nephropathy, neuropathy, limb loss, blindness, coronary artery disease, and death. Intensive lifestyle modification including diet, exercise and weight loss are the first line of treatment for  diabetes. Hemoglobin A1c level ordered today.  At risk for heart disease. Alicia Lowery was given approximately 15 minutes of coronary artery disease prevention counseling today. She is 59 y.o. female and has risk factors for heart disease including obesity. We discussed intensive lifestyle modifications today with an emphasis on specific weight loss instructions and strategies.   Repetitive spaced learning was employed today to elicit superior memory  formation and behavioral change.  Class 3 severe obesity with serious comorbidity and body mass index (BMI) of 40.0 to 44.9 in adult, unspecified obesity type ([place]).  Alicia Lowery is currently in the action stage of change. As such, her goal is to continue with weight loss efforts. She has agreed to the Category 1 Plan.   She will work on meal planning, intentional eating, and increasing her water intake.  Exercise goals: Alicia Lowery will continue walking for exercise.  Behavioral modification strategies: increasing lean protein intake, decreasing simple carbohydrates, increasing vegetables, increasing water intake, decreasing eating out, no skipping meals, meal planning and cooking strategies and keeping healthy foods in the home.  Alicia Lowery has agreed to follow-up with our clinic in 2-3 weeks. She was informed of the importance of frequent follow-up visits to maximize her success with intensive lifestyle modifications for her multiple health conditions.   Alicia Lowery was informed we would discuss her lab results at her next visit unless there is a critical issue that needs to be addressed sooner. Alicia Lowery agreed to keep her next visit at the agreed upon time to discuss these results.  Objective:   Blood pressure 105/67, pulse 85, temperature 98.1 F (36.7 C), height 5\' 2"  (1.575 m), weight 226 lb (102.5 kg), SpO2 95 %. Body mass index is 41.34 kg/m.  General: Cooperative, alert, well developed, in no acute distress. HEENT: Conjunctivae and lids unremarkable. Cardiovascular: Regular rhythm.  Lungs: Normal work of breathing. Neurologic: No focal deficits.   Lab Results  Component Value Date   CREATININE 0.94 06/23/2019   BUN 13 06/23/2019   NA 141 06/23/2019   K 4.2 06/23/2019   CL 100 06/23/2019   CO2 25 06/23/2019   Lab Results  Component Value Date   ALT 29 06/23/2019   AST 19 06/23/2019   ALKPHOS 75 06/23/2019   BILITOT 1.1 06/23/2019   Lab Results  Component Value Date   HGBA1C 7.1 (A)  06/23/2019   HGBA1C 7.1 (A) 02/12/2019   HGBA1C 7.3 (A) 10/13/2018   HGBA1C 9.2 (A) 06/11/2018   HGBA1C 7.0 04/16/2017   Lab Results  Component Value Date   INSULIN 17.6 06/24/2019   Lab Results  Component Value Date   TSH 1.280 06/24/2019   Lab Results  Component Value Date   CHOL 247 (H) 06/23/2019   HDL 46 06/23/2019   LDLCALC 179 (H) 06/23/2019   TRIG 123 06/23/2019   CHOLHDL 5.4 (H) 06/23/2019   Lab Results  Component Value Date   WBC 3.7 06/23/2019   HGB 13.1 06/23/2019   HCT 39.4 06/23/2019   MCV 91 06/23/2019   PLT 233 06/23/2019   No results found for: IRON, TIBC, FERRITIN  Attestation Statements:   Reviewed by clinician on day of visit: allergies, medications, problem list, medical history, surgical history, family history, social history, and previous encounter notes.  Migdalia Dk, am acting as Location manager for [employer], DO   I have reviewed the above documentation for accuracy and completeness, and I agree with the above. Jearld Lesch, DO

## 2019-10-16 LAB — HEMOGLOBIN A1C
Est. average glucose Bld gHb Est-mCnc: 137 mg/dL
Hgb A1c MFr Bld: 6.4 % — ABNORMAL HIGH (ref 4.8–5.6)

## 2019-10-16 LAB — VITAMIN D 25 HYDROXY (VIT D DEFICIENCY, FRACTURES): Vit D, 25-Hydroxy: 57.6 ng/mL (ref 30.0–100.0)

## 2019-10-22 ENCOUNTER — Ambulatory Visit: Payer: BC Managed Care – PPO | Admitting: Family Medicine

## 2019-10-27 ENCOUNTER — Encounter: Payer: Self-pay | Admitting: Family Medicine

## 2019-11-02 ENCOUNTER — Ambulatory Visit (INDEPENDENT_AMBULATORY_CARE_PROVIDER_SITE_OTHER): Payer: BC Managed Care – PPO | Admitting: Bariatrics

## 2019-11-02 ENCOUNTER — Encounter (INDEPENDENT_AMBULATORY_CARE_PROVIDER_SITE_OTHER): Payer: Self-pay | Admitting: Bariatrics

## 2019-11-02 ENCOUNTER — Other Ambulatory Visit: Payer: Self-pay

## 2019-11-02 VITALS — BP 105/73 | HR 83 | Temp 98.1°F | Ht 62.0 in | Wt 223.0 lb

## 2019-11-02 DIAGNOSIS — E559 Vitamin D deficiency, unspecified: Secondary | ICD-10-CM

## 2019-11-02 DIAGNOSIS — Z9189 Other specified personal risk factors, not elsewhere classified: Secondary | ICD-10-CM | POA: Diagnosis not present

## 2019-11-02 DIAGNOSIS — E1169 Type 2 diabetes mellitus with other specified complication: Secondary | ICD-10-CM

## 2019-11-02 DIAGNOSIS — E669 Obesity, unspecified: Secondary | ICD-10-CM

## 2019-11-02 DIAGNOSIS — Z6841 Body Mass Index (BMI) 40.0 and over, adult: Secondary | ICD-10-CM

## 2019-11-02 MED ORDER — VITAMIN D (ERGOCALCIFEROL) 1.25 MG (50000 UNIT) PO CAPS: ORAL_CAPSULE | ORAL | 0 refills | Status: DC

## 2019-11-02 NOTE — Progress Notes (Signed)
Chief Complaint:   OBESITY Alicia Lowery is here to discuss her progress with her obesity treatment plan along with follow-up of her obesity related diagnoses. Alicia Lowery is following a lower carbohydrate, vegetable and lean protein rich diet plan and states she is following her eating plan approximately 90% of the time. Alicia Lowery states she is exercising 0 minutes 0 times per week.  Today's visit was #: 8 Starting weight: 246 lbs Starting date: 06/24/2019 Today's weight: 223 lbs Today's date: 11/02/2019 Total lbs lost to date: 23 Total lbs lost since last in-office visit: 3  Interim History: Alicia Lowery is down 3 lbs since her last visit. She states she hates eggs.  Subjective:   Vitamin D deficiency. No nausea, vomiting, or muscle weakness. Last Vitamin D was 57.6 on 10/15/2019.  Type 2 diabetes mellitus with obesity (HCC). [name]'s A1c has improved from 7.1 to 6.4. She is taking metformin and Ozempic. Fasting blood sugars range between 90 and 100.  Lab Results  Component Value Date   HGBA1C 6.4 (H) 10/15/2019   HGBA1C 7.1 (A) 06/23/2019   HGBA1C 7.1 (A) 02/12/2019   Lab Results  Component Value Date   MICROALBUR <5.0 06/23/2019   LDLCALC 179 (H) 06/23/2019   CREATININE 0.94 06/23/2019   Lab Results  Component Value Date   INSULIN 17.6 06/24/2019   At risk for dehydration. Alicia Lowery is at risk of dehydration due to summertime with warmer weather and increased activities.  Assessment/Plan:   Vitamin D deficiency. Low Vitamin D level contributes to fatigue and are associated with obesity, breast, and colon cancer. She was given a prescription for Vitamin D, Ergocalciferol, (DRISDOL) 1.25 MG (50000 UNIT) CAPS capsule every other week #2 with 0 refills and will follow-up for routine testing of Vitamin D, at least 2-3 times per year to avoid over-replacement.   Type 2 diabetes mellitus with obesity (HCC). Good blood sugar control is important to decrease the likelihood of diabetic  complications such as nephropathy, neuropathy, limb loss, blindness, coronary artery disease, and death. Intensive lifestyle modification including diet, exercise and weight loss are the first line of treatment for diabetes. Maddyson will continue metformin as directed.  At risk for dehydration. Alicia Lowery was given approximately 15 minutes dehydration prevention counseling today. Alicia Lowery is at risk for dehydration due to weight loss and current medication(s). She was encouraged to hydrate and monitor fluid status to avoid dehydration as well as weight loss plateaus.   Class 3 severe obesity with serious comorbidity and body mass index (BMI) of 40.0 to 44.9 in adult, unspecified obesity type (HCC).  Alicia Lowery is currently in the action stage of change. As such, her goal is to continue with weight loss efforts. She has agreed to the Category 1 Plan.   We independently reviewed with the patient labs from 10/15/2019 including Vitamin D and A1c.  She will work on meal planning and will consider other alternatives.  Exercise goals: Alicia Lowery will consider biking.  Behavioral modification strategies: increasing lean protein intake, decreasing simple carbohydrates, increasing vegetables, increasing water intake, decreasing eating out, no skipping meals, meal planning and cooking strategies and keeping healthy foods in the home.  Alicia Lowery has agreed to follow-up with our clinic in 2 weeks and will have IC at that time. She was informed of the importance of frequent follow-up visits to maximize her success with intensive lifestyle modifications for her multiple health conditions.   Objective:   Blood pressure 105/73, pulse 83, temperature 98.1 F (36.7 C), height 5'  2" (1.575 m), weight 223 lb (101.2 kg), SpO2 96 %. Body mass index is 40.79 kg/m.  General: Cooperative, alert, well developed, in no acute distress. HEENT: Conjunctivae and lids unremarkable. Cardiovascular: Regular rhythm.  Lungs: Normal work of  breathing. Neurologic: No focal deficits.   Lab Results  Component Value Date   CREATININE 0.94 06/23/2019   BUN 13 06/23/2019   NA 141 06/23/2019   K 4.2 06/23/2019   CL 100 06/23/2019   CO2 25 06/23/2019   Lab Results  Component Value Date   ALT 29 06/23/2019   AST 19 06/23/2019   ALKPHOS 75 06/23/2019   BILITOT 1.1 06/23/2019   Lab Results  Component Value Date   HGBA1C 6.4 (H) 10/15/2019   HGBA1C 7.1 (A) 06/23/2019   HGBA1C 7.1 (A) 02/12/2019   HGBA1C 7.3 (A) 10/13/2018   HGBA1C 9.2 (A) 06/11/2018   Lab Results  Component Value Date   INSULIN 17.6 06/24/2019   Lab Results  Component Value Date   TSH 1.280 06/24/2019   Lab Results  Component Value Date   CHOL 247 (H) 06/23/2019   HDL 46 06/23/2019   LDLCALC 179 (H) 06/23/2019   TRIG 123 06/23/2019   CHOLHDL 5.4 (H) 06/23/2019   Lab Results  Component Value Date   WBC 3.7 06/23/2019   HGB 13.1 06/23/2019   HCT 39.4 06/23/2019   MCV 91 06/23/2019   PLT 233 06/23/2019   No results found for: IRON, TIBC, FERRITIN  Attestation Statements:   Reviewed by clinician on day of visit: allergies, medications, problem list, medical history, surgical history, family history, social history, and previous encounter notes.  Migdalia Dk, am acting as Location manager for [employer], DO   I have reviewed the above documentation for accuracy and completeness, and I agree with the above. Jearld Lesch, DO

## 2019-11-24 ENCOUNTER — Encounter (INDEPENDENT_AMBULATORY_CARE_PROVIDER_SITE_OTHER): Payer: Self-pay | Admitting: Bariatrics

## 2019-11-24 ENCOUNTER — Other Ambulatory Visit: Payer: Self-pay

## 2019-11-24 ENCOUNTER — Ambulatory Visit (INDEPENDENT_AMBULATORY_CARE_PROVIDER_SITE_OTHER): Payer: BC Managed Care – PPO | Admitting: Bariatrics

## 2019-11-24 VITALS — BP 97/63 | HR 72 | Temp 98.1°F | Ht 62.0 in | Wt 222.0 lb

## 2019-11-24 DIAGNOSIS — Z9189 Other specified personal risk factors, not elsewhere classified: Secondary | ICD-10-CM | POA: Diagnosis not present

## 2019-11-24 DIAGNOSIS — E559 Vitamin D deficiency, unspecified: Secondary | ICD-10-CM | POA: Diagnosis not present

## 2019-11-24 DIAGNOSIS — R5383 Other fatigue: Secondary | ICD-10-CM | POA: Diagnosis not present

## 2019-11-24 DIAGNOSIS — I10 Essential (primary) hypertension: Secondary | ICD-10-CM | POA: Diagnosis not present

## 2019-11-24 DIAGNOSIS — R0602 Shortness of breath: Secondary | ICD-10-CM

## 2019-11-24 DIAGNOSIS — Z6841 Body Mass Index (BMI) 40.0 and over, adult: Secondary | ICD-10-CM

## 2019-11-24 MED ORDER — VITAMIN D (ERGOCALCIFEROL) 1.25 MG (50000 UNIT) PO CAPS: 50000.0000 [IU] | ORAL_CAPSULE | ORAL | 0 refills | Status: DC

## 2019-11-24 NOTE — Progress Notes (Signed)
Chief Complaint:   OBESITY Alicia Lowery is here to discuss her progress with her obesity treatment plan along with follow-up of her obesity related diagnoses. Alicia Lowery is on the Category 1 Plan and states she is following her eating plan approximately 90% of the time. Alicia Lowery states she is walking 20 minutes 3 times per week.  Today's visit was #: 9 Starting weight: 246 lbs Starting date: 06/24/2019 Today's weight: 222 lbs Today's date: 11/24/2019 Total lbs lost to date: 24 Total lbs lost since last in-office visit: 1  Interim History: Alicia Lowery is down 1 lb and doing well. She reports getting adequate protein intake.  Subjective:   Vitamin D deficiency. No nausea, vomiting, or muscle weakness.    Ref. Range 10/15/2019 14:45  Vitamin D, 25-Hydroxy Latest Ref Range: 30.0 - 100.0 ng/mL 57.6   Essential hypertension. Alicia Lowery is taking Zestoretic. Blood pressure is well controlled.  BP Readings from Last 3 Encounters:  11/24/19 97/63  11/02/19 105/73  10/15/19 105/67   Lab Results  Component Value Date   CREATININE 0.94 06/23/2019   CREATININE 0.72 06/11/2018   CREATININE 0.79 04/16/2017   Other fatigue. Previous RMR 1276; RMR at today's visit 1855.  SOB (shortness of breath) on exertion. Alicia Lowery notes increasing shortness of breath with exercising and seems to be worsening over time with weight gain. She notes getting out of breath sooner with activity than she used to. Alicia Lowery denies shortness of breath at rest or orthopnea.  At risk for activity intolerance. Alicia Lowery is at risk of exercise intolerance due to fatigue and shortness of breath.  Assessment/Plan:   Vitamin D deficiency. Low Vitamin D level contributes to fatigue and are associated with obesity, breast, and colon cancer. She was given a prescription for Vitamin D, Ergocalciferol, (DRISDOL) 1.25 MG (50000 UNIT) CAPS capsule every week #4 with 0 refills and will follow-up for routine testing of Vitamin D, at least 2-3  times per year to avoid over-replacement.  Essential hypertension. Alicia Lowery is working on healthy weight loss and exercise to improve blood pressure control. We will watch for signs of hypotension as she continues her lifestyle modifications. Alicia Lowery will continue her medication as directed.   Other fatigue. Fatigue may be related to obesity, depression or many other causes. Labs will be ordered, and in the meanwhile, Alicia Lowery will focus on self care including making healthy food choices, increasing physical activity, increasing protein, eating more consistent meals, and focusing on stress reduction.  SOB (shortness of breath) on exertion. Alicia Lowery's shortness of breath appears to be obesity related and exercise induced. She has agreed to work on weight loss and gradually increase exercise to treat her exercise induced shortness of breath. Will continue to monitor closely.  At risk for activity intolerance. Alicia Lowery was given approximately 15 minutes of exercise intolerance counseling today. She is 59 y.o. female and has risk factors exercise intolerance including obesity. We discussed intensive lifestyle modifications today with an emphasis on specific weight loss instructions and strategies. Alicia Lowery will slowly increase activity as tolerated.  Repetitive spaced learning was employed today to elicit superior memory formation and behavioral change.  Class 3 severe obesity with serious comorbidity and body mass index (BMI) of 40.0 to 44.9 in adult, unspecified obesity type (HCC).  Alicia Lowery is currently in the action stage of change. As such, her goal is to continue with weight loss efforts. She has agreed to the Category 2 Plan alternating with the Pescatarian Plan.  She will work on meal planning,  intentional eating, and increasing her water intake.    Exercise goals: Alicia Lowery will continue walking 20 minutes 3 times per week.  Behavioral modification strategies: increasing lean protein intake, decreasing  simple carbohydrates, increasing vegetables, increasing water intake, decreasing eating out, no skipping meals, meal planning and cooking strategies, keeping healthy foods in the home and planning for success.  Alicia Lowery has agreed to follow-up with our clinic in 2 weeks. She was informed of the importance of frequent follow-up visits to maximize her success with intensive lifestyle modifications for her multiple health conditions.   Objective:   Blood pressure 97/63, pulse 72, temperature 98.1 F (36.7 C), height 5\' 2"  (1.575 m), weight 222 lb (100.7 kg), SpO2 96 %. Body mass index is 40.6 kg/m.  General: Cooperative, alert, well developed, in no acute distress. HEENT: Conjunctivae and lids unremarkable. Cardiovascular: Regular rhythm.  Lungs: Normal work of breathing. Neurologic: No focal deficits.   Lab Results  Component Value Date   CREATININE 0.94 06/23/2019   BUN 13 06/23/2019   NA 141 06/23/2019   K 4.2 06/23/2019   CL 100 06/23/2019   CO2 25 06/23/2019   Lab Results  Component Value Date   ALT 29 06/23/2019   AST 19 06/23/2019   ALKPHOS 75 06/23/2019   BILITOT 1.1 06/23/2019   Lab Results  Component Value Date   HGBA1C 6.4 (H) 10/15/2019   HGBA1C 7.1 (A) 06/23/2019   HGBA1C 7.1 (A) 02/12/2019   HGBA1C 7.3 (A) 10/13/2018   HGBA1C 9.2 (A) 06/11/2018   Lab Results  Component Value Date   INSULIN 17.6 06/24/2019   Lab Results  Component Value Date   TSH 1.280 06/24/2019   Lab Results  Component Value Date   CHOL 247 (H) 06/23/2019   HDL 46 06/23/2019   LDLCALC 179 (H) 06/23/2019   TRIG 123 06/23/2019   CHOLHDL 5.4 (H) 06/23/2019   Lab Results  Component Value Date   WBC 3.7 06/23/2019   HGB 13.1 06/23/2019   HCT 39.4 06/23/2019   MCV 91 06/23/2019   PLT 233 06/23/2019   No results found for: IRON, TIBC, FERRITIN  Attestation Statements:   Reviewed by clinician on day of visit: allergies, medications, problem list, medical history, surgical  history, family history, social history, and previous encounter notes.  Time spent on visit including pre-visit chart review and post-visit charting and care was 20 minutes.   08/21/2019, am acting as Fernanda [employer] for Energy manager, DO   I have reviewed the above documentation for accuracy and completeness, and I agree with the above. [employer], DO

## 2019-11-30 ENCOUNTER — Other Ambulatory Visit (INDEPENDENT_AMBULATORY_CARE_PROVIDER_SITE_OTHER): Payer: Self-pay

## 2019-11-30 DIAGNOSIS — E559 Vitamin D deficiency, unspecified: Secondary | ICD-10-CM

## 2019-11-30 MED ORDER — VITAMIN D (ERGOCALCIFEROL) 1.25 MG (50000 UNIT) PO CAPS: 50000.0000 [IU] | ORAL_CAPSULE | ORAL | 0 refills | Status: DC

## 2019-12-10 ENCOUNTER — Ambulatory Visit (INDEPENDENT_AMBULATORY_CARE_PROVIDER_SITE_OTHER): Payer: BC Managed Care – PPO | Admitting: Family Medicine

## 2019-12-10 ENCOUNTER — Other Ambulatory Visit: Payer: Self-pay

## 2019-12-10 ENCOUNTER — Encounter: Payer: Self-pay | Admitting: Family Medicine

## 2019-12-10 VITALS — BP 110/70 | HR 70 | Temp 97.0°F | Ht 62.0 in | Wt 231.6 lb

## 2019-12-10 DIAGNOSIS — I10 Essential (primary) hypertension: Secondary | ICD-10-CM

## 2019-12-10 DIAGNOSIS — Z0000 Encounter for general adult medical examination without abnormal findings: Secondary | ICD-10-CM | POA: Diagnosis not present

## 2019-12-10 DIAGNOSIS — I152 Hypertension secondary to endocrine disorders: Secondary | ICD-10-CM

## 2019-12-10 DIAGNOSIS — E118 Type 2 diabetes mellitus with unspecified complications: Secondary | ICD-10-CM

## 2019-12-10 DIAGNOSIS — Z23 Encounter for immunization: Secondary | ICD-10-CM

## 2019-12-10 DIAGNOSIS — Z6841 Body Mass Index (BMI) 40.0 and over, adult: Secondary | ICD-10-CM

## 2019-12-10 DIAGNOSIS — R87610 Atypical squamous cells of undetermined significance on cytologic smear of cervix (ASC-US): Secondary | ICD-10-CM

## 2019-12-10 DIAGNOSIS — E66813 Obesity, class 3: Secondary | ICD-10-CM

## 2019-12-10 DIAGNOSIS — E1159 Type 2 diabetes mellitus with other circulatory complications: Secondary | ICD-10-CM

## 2019-12-10 DIAGNOSIS — Z8249 Family history of ischemic heart disease and other diseases of the circulatory system: Secondary | ICD-10-CM

## 2019-12-10 DIAGNOSIS — E785 Hyperlipidemia, unspecified: Secondary | ICD-10-CM

## 2019-12-10 DIAGNOSIS — E119 Type 2 diabetes mellitus without complications: Secondary | ICD-10-CM

## 2019-12-10 DIAGNOSIS — Z8669 Personal history of other diseases of the nervous system and sense organs: Secondary | ICD-10-CM

## 2019-12-10 DIAGNOSIS — E1169 Type 2 diabetes mellitus with other specified complication: Secondary | ICD-10-CM

## 2019-12-10 DIAGNOSIS — E559 Vitamin D deficiency, unspecified: Secondary | ICD-10-CM | POA: Diagnosis not present

## 2019-12-10 DIAGNOSIS — J301 Allergic rhinitis due to pollen: Secondary | ICD-10-CM

## 2019-12-10 DIAGNOSIS — M109 Gout, unspecified: Secondary | ICD-10-CM

## 2019-12-10 DIAGNOSIS — E669 Obesity, unspecified: Secondary | ICD-10-CM

## 2019-12-10 NOTE — Progress Notes (Signed)
   Subjective:    Patient ID: Alicia Lowery, female    DOB: [birth year], 59 y.o.   MRN: [id number]  HPI She is here for complete examination.  She does have underlying diabetes and is now going to the weight loss and wellness center.  She has lost 25 pounds and is very happy with this.  She is now walking regularly, does check her feet and plans to have an eye exam in the near future.  She continues on Metformin as well as Ozempic.  She was found to be vitamin D deficient with most recent blood work showing adequate response to supplementation.  She has a very good attitude towards this.  She does have seasonal allergies and is having no difficulty with that.  She does have a previous history of migraine headaches but none in the recent past.  Also did have gout symptoms but again having no trouble the present time.  She did have an abnormal Pap and is scheduled for repeat on a 3-year cycle.  Her blood pressure is controlled with lisinopril/HCTZ.  She continues on Crestor and is having no aches or pains.  There is a family history of heart disease.  Otherwise her family and social history as well as health maintenance and immunizations was reviewed.   Review of Systems  All other systems reviewed and are negative.      Objective:   Physical Exam Alert and in no distress. Tympanic membranes and canals are normal. Pharyngeal area is normal. Neck is supple without adenopathy or thyromegaly. Cardiac exam shows a regular sinus rhythm without murmurs or gallops. Lungs are clear to auscultation. Abdominal exam shows no masses or tenderness with normal bowel sounds.  Foot exam done it was essentially normal although pulses were difficult to feel.      Assessment & Plan:  Routine general medical examination at a health care facility  Need for shingles vaccine - Plan: Varicella-zoster vaccine IM (Shingrix)  Vitamin D deficiency  Class 3 severe obesity with serious comorbidity and body mass index (BMI)  of 40.0 to 44.9 in adult, unspecified obesity type (HCC)  Type 2 diabetes mellitus with obesity (HCC)  Hypertension associated with diabetes (HCC)  Hyperlipidemia associated with type 2 diabetes mellitus (HCC)  Family history of heart disease in female family member before age 55  ASCUS of cervix with negative high risk HPV  History of migraine headaches  Gout, unspecified cause, unspecified chronicity, unspecified site  Type 2 diabetes mellitus with complications (HCC)  Obesity, morbid (HCC)  Seasonal allergic rhinitis due to pollen  In general she seems to be doing quite nicely on her weight loss program.  She will continue on her present medication regimen and follow-up concerning abnormal Pap.

## 2019-12-15 ENCOUNTER — Ambulatory Visit (INDEPENDENT_AMBULATORY_CARE_PROVIDER_SITE_OTHER): Payer: BC Managed Care – PPO | Admitting: Bariatrics

## 2019-12-15 ENCOUNTER — Other Ambulatory Visit: Payer: Self-pay

## 2019-12-15 ENCOUNTER — Encounter (INDEPENDENT_AMBULATORY_CARE_PROVIDER_SITE_OTHER): Payer: Self-pay | Admitting: Bariatrics

## 2019-12-15 VITALS — BP 98/66 | HR 70 | Temp 97.9°F | Ht 62.0 in | Wt 226.0 lb

## 2019-12-15 DIAGNOSIS — R7303 Prediabetes: Secondary | ICD-10-CM

## 2019-12-15 DIAGNOSIS — I10 Essential (primary) hypertension: Secondary | ICD-10-CM | POA: Diagnosis not present

## 2019-12-15 DIAGNOSIS — Z6841 Body Mass Index (BMI) 40.0 and over, adult: Secondary | ICD-10-CM

## 2019-12-15 NOTE — Progress Notes (Signed)
Chief Complaint:   OBESITY Alicia Lowery is here to discuss her progress with her obesity treatment plan along with follow-up of her obesity related diagnoses. Alicia Lowery is on the Category 2 Plan and states she is following her eating plan approximately 80% of the time. Alicia Lowery states she is walking 20 minutes 3 times per week.  Today's visit was #: 10 Starting weight: 246 lbs Starting date: 06/24/2019 Today's weight: 226 lbs Today's date: 12/15/2019 Total lbs lost to date: 20  Total lbs lost since last in-office visit: 0  Interim History: Alicia Lowery is up 4 lbs. She went to a wedding and more activities. She states she bought a new bike.  Subjective:   Pre-diabetes. Alicia Lowery has a diagnosis of prediabetes based on her elevated HgA1c and was informed this puts her at greater risk of developing diabetes. She continues to work on diet and exercise to decrease her risk of diabetes. She denies nausea or hypoglycemia. Alicia Lowery is taking metformin and Ozempic.  Lab Results  Component Value Date   HGBA1C 6.4 (H) 10/15/2019   Lab Results  Component Value Date   INSULIN 17.6 06/24/2019   Essential hypertension. Alicia Lowery is taking Zestoretic. Blood pressure is well controlled.  BP Readings from Last 3 Encounters:  12/15/19 98/66  12/10/19 110/70  11/24/19 97/63   Lab Results  Component Value Date   CREATININE 0.94 06/23/2019   CREATININE 0.72 06/11/2018   CREATININE 0.79 04/16/2017   Assessment/Plan:   Pre-diabetes. Alicia Lowery will continue to work on weight loss, exercise, increasing healthy fats and protein, and decreasing simple carbohydrates to help decrease the risk of diabetes.   Essential hypertension. Alicia Lowery is working on healthy weight loss and exercise to improve blood pressure control. We will watch for signs of hypotension as she continues her lifestyle modifications. She will continue her medication as directed.   Class 3 severe obesity with serious comorbidity and body mass  index (BMI) of 40.0 to 44.9 in adult, unspecified obesity type (HCC).  Alicia Lowery is currently in the action stage of change. As such, her goal is to continue with weight loss efforts. She has agreed to the Category 2 Plan.   She will work on meal planning, intentional eating, and increasing her water intake (fruit infused).  Ideas were given for breakfast.   Exercise goals: Alicia Lowery will start riding her bike at [facility].  Behavioral modification strategies: increasing lean protein intake, decreasing simple carbohydrates, increasing vegetables, increasing water intake, decreasing eating out, no skipping meals, meal planning and cooking strategies, keeping healthy foods in the home and planning for success.  Alicia Lowery has agreed to follow-up with our clinic in 2 weeks. She was informed of the importance of frequent follow-up visits to maximize her success with intensive lifestyle modifications for her multiple health conditions.   Objective:   Blood pressure 98/66, pulse 70, temperature 97.9 F (36.6 C), height 5\' 2"  (1.575 m), weight 226 lb (102.5 kg), SpO2 94 %. Body mass index is 41.34 kg/m.  General: Cooperative, alert, well developed, in no acute distress. HEENT: Conjunctivae and lids unremarkable. Cardiovascular: Regular rhythm.  Lungs: Normal work of breathing. Neurologic: No focal deficits.   Lab Results  Component Value Date   CREATININE 0.94 06/23/2019   BUN 13 06/23/2019   NA 141 06/23/2019   K 4.2 06/23/2019   CL 100 06/23/2019   CO2 25 06/23/2019   Lab Results  Component Value Date   ALT 29 06/23/2019   AST 19 06/23/2019  ALKPHOS 75 06/23/2019   BILITOT 1.1 06/23/2019   Lab Results  Component Value Date   HGBA1C 6.4 (H) 10/15/2019   HGBA1C 7.1 (A) 06/23/2019   HGBA1C 7.1 (A) 02/12/2019   HGBA1C 7.3 (A) 10/13/2018   HGBA1C 9.2 (A) 06/11/2018   Lab Results  Component Value Date   INSULIN 17.6 06/24/2019   Lab Results  Component Value Date   TSH 1.280  06/24/2019   Lab Results  Component Value Date   CHOL 247 (H) 06/23/2019   HDL 46 06/23/2019   LDLCALC 179 (H) 06/23/2019   TRIG 123 06/23/2019   CHOLHDL 5.4 (H) 06/23/2019   Lab Results  Component Value Date   WBC 3.7 06/23/2019   HGB 13.1 06/23/2019   HCT 39.4 06/23/2019   MCV 91 06/23/2019   PLT 233 06/23/2019   No results found for: IRON, TIBC, FERRITIN  Attestation Statements:   Reviewed by clinician on day of visit: allergies, medications, problem list, medical history, surgical history, family history, social history, and previous encounter notes.  Time spent on visit including pre-visit chart review and post-visit charting and care was 20 minutes.   Fernanda Drum, am acting as Energy manager for [employer], DO   I have reviewed the above documentation for accuracy and completeness, and I agree with the above. Corinna Capra, DO

## 2019-12-29 ENCOUNTER — Ambulatory Visit (INDEPENDENT_AMBULATORY_CARE_PROVIDER_SITE_OTHER): Payer: BC Managed Care – PPO | Admitting: Bariatrics

## 2020-01-13 ENCOUNTER — Other Ambulatory Visit: Payer: Self-pay

## 2020-01-13 ENCOUNTER — Ambulatory Visit (INDEPENDENT_AMBULATORY_CARE_PROVIDER_SITE_OTHER): Payer: BC Managed Care – PPO | Admitting: Bariatrics

## 2020-01-13 ENCOUNTER — Encounter (INDEPENDENT_AMBULATORY_CARE_PROVIDER_SITE_OTHER): Payer: Self-pay | Admitting: Bariatrics

## 2020-01-13 VITALS — BP 105/71 | HR 71 | Temp 97.8°F | Ht 62.0 in | Wt 226.0 lb

## 2020-01-13 DIAGNOSIS — E669 Obesity, unspecified: Secondary | ICD-10-CM

## 2020-01-13 DIAGNOSIS — E559 Vitamin D deficiency, unspecified: Secondary | ICD-10-CM | POA: Diagnosis not present

## 2020-01-13 DIAGNOSIS — Z6841 Body Mass Index (BMI) 40.0 and over, adult: Secondary | ICD-10-CM

## 2020-01-13 DIAGNOSIS — I10 Essential (primary) hypertension: Secondary | ICD-10-CM

## 2020-01-13 DIAGNOSIS — Z9189 Other specified personal risk factors, not elsewhere classified: Secondary | ICD-10-CM | POA: Diagnosis not present

## 2020-01-13 DIAGNOSIS — E66813 Obesity, class 3: Secondary | ICD-10-CM

## 2020-01-13 DIAGNOSIS — E1159 Type 2 diabetes mellitus with other circulatory complications: Secondary | ICD-10-CM | POA: Diagnosis not present

## 2020-01-13 DIAGNOSIS — E1169 Type 2 diabetes mellitus with other specified complication: Secondary | ICD-10-CM | POA: Diagnosis not present

## 2020-01-13 DIAGNOSIS — I152 Hypertension secondary to endocrine disorders: Secondary | ICD-10-CM

## 2020-01-13 MED ORDER — VITAMIN D (ERGOCALCIFEROL) 1.25 MG (50000 UNIT) PO CAPS: 50000.0000 [IU] | ORAL_CAPSULE | ORAL | 0 refills | Status: DC

## 2020-01-13 NOTE — Progress Notes (Signed)
Chief Complaint:   Alicia Lowery is here to discuss her progress with her Alicia treatment plan along with follow-up of her Alicia related diagnoses. Alicia Lowery is on the Category 2 Plan and states she is following her eating plan approximately 90% of the time. Alicia Lowery states she is walking 20 minutes 3 times per week.  Today's visit was #: 11 Starting weight: 246 lbs Starting date: 06/24/2019 Today's weight: 226 lbs Today's date: 01/13/2020 Total lbs lost to date: 20 Total lbs lost since last in-office visit: 0  Interim History: Alicia Lowery's weight remains the same. She reports doing okay with her water intake.  Subjective:   Diabetes mellitus type 2 in obese (HCC). [name] is taking metformin and Ozempic. Lowest blood sugar 84; last A1c level 6.4.   Lab Results  Component Value Date   HGBA1C 6.4 (H) 10/15/2019   HGBA1C 7.1 (A) 06/23/2019   HGBA1C 7.1 (A) 02/12/2019   Lab Results  Component Value Date   MICROALBUR <5.0 06/23/2019   LDLCALC 179 (H) 06/23/2019   CREATININE 0.94 06/23/2019   Lab Results  Component Value Date   INSULIN 17.6 06/24/2019   Hypertension associated with diabetes (HCC). Blood pressure is controlled.  BP Readings from Last 3 Encounters:  01/13/20 105/71  12/15/19 98/66  12/10/19 110/70   Lab Results  Component Value Date   CREATININE 0.94 06/23/2019   CREATININE 0.72 06/11/2018   CREATININE 0.79 04/16/2017   Vitamin D deficiency. Alicia Lowery reports minimal sunlight exposure.   Ref. Range 10/15/2019 14:45  Vitamin D, 25-Hydroxy Latest Ref Range: 30.0 - 100.0 ng/mL 57.6   At risk for activity intolerance. Alicia Lowery is at risk of exercise intolerance due to warm weather and Alicia.  Assessment/Plan:   Diabetes mellitus type 2 in obese (HCC). Good blood sugar control is important to decrease the likelihood of diabetic complications such as nephropathy, neuropathy, limb loss, blindness, coronary artery disease, and death. Intensive lifestyle  modification including diet, exercise and weight loss are the first line of treatment for diabetes. Smt will continue metformin as directed  Hypertension associated with diabetes (HCC). Elner is working on healthy weight loss and exercise to improve blood pressure control. We will watch for signs of hypotension as she continues her lifestyle modifications. She will continue her medication as directed.   Vitamin D deficiency. Low Vitamin D level contributes to fatigue and are associated with Alicia, breast, and colon cancer. She was given a prescription for Vitamin D, Ergocalciferol, (DRISDOL) 1.25 MG (50000 UNIT) CAPS capsule every week #4 with 0 refills and will follow-up for routine testing of Vitamin D, at least 2-3 times per year to avoid over-replacement.   At risk for activity intolerance. [name] was given approximately 15 minutes of exercise intolerance counseling today. She is 59 y.o. female and has risk factors exercise intolerance including Alicia. We discussed intensive lifestyle modifications today with an emphasis on specific weight loss instructions and strategies. Shuntel will slowly increase activity as tolerated.  Repetitive spaced learning was employed today to elicit superior memory formation and behavioral change.  Class 3 severe Alicia with serious comorbidity and body mass index (BMI) of 40.0 to 44.9 in adult, unspecified Alicia type (HCC).  Alicia Lowery is currently in the action stage of change. As such, her goal is to continue with weight loss efforts. She has agreed to the Category 2 Plan.   She will work on meal planning, intentional eating, and increasing her water intake.   Exercise goals: Alicia Lowery will  continue walking 20 minutes 3 times per week for exercise.  Behavioral modification strategies: increasing lean protein intake, decreasing simple carbohydrates, increasing vegetables, increasing water intake, decreasing eating out, no skipping meals, meal planning and  cooking strategies, keeping healthy foods in the home and planning for success.  Alicia Lowery has agreed to follow-up with our clinic fasting in 2-3 weeks. She was informed of the importance of frequent follow-up visits to maximize her success with intensive lifestyle modifications for her multiple health conditions.   Objective:   Blood pressure 105/71, pulse 71, temperature 97.8 F (36.6 C), height 5\' 2"  (1.575 m), weight 226 lb (102.5 kg), SpO2 93 %. Body mass index is 41.34 kg/m.  General: Cooperative, alert, well developed, in no acute distress. HEENT: Conjunctivae and lids unremarkable. Cardiovascular: Regular rhythm.  Lungs: Normal work of breathing. Neurologic: No focal deficits.   Lab Results  Component Value Date   CREATININE 0.94 06/23/2019   BUN 13 06/23/2019   NA 141 06/23/2019   K 4.2 06/23/2019   CL 100 06/23/2019   CO2 25 06/23/2019   Lab Results  Component Value Date   ALT 29 06/23/2019   AST 19 06/23/2019   ALKPHOS 75 06/23/2019   BILITOT 1.1 06/23/2019   Lab Results  Component Value Date   HGBA1C 6.4 (H) 10/15/2019   HGBA1C 7.1 (A) 06/23/2019   HGBA1C 7.1 (A) 02/12/2019   HGBA1C 7.3 (A) 10/13/2018   HGBA1C 9.2 (A) 06/11/2018   Lab Results  Component Value Date   INSULIN 17.6 06/24/2019   Lab Results  Component Value Date   TSH 1.280 06/24/2019   Lab Results  Component Value Date   CHOL 247 (H) 06/23/2019   HDL 46 06/23/2019   LDLCALC 179 (H) 06/23/2019   TRIG 123 06/23/2019   CHOLHDL 5.4 (H) 06/23/2019   Lab Results  Component Value Date   WBC 3.7 06/23/2019   HGB 13.1 06/23/2019   HCT 39.4 06/23/2019   MCV 91 06/23/2019   PLT 233 06/23/2019   No results found for: IRON, TIBC, FERRITIN  Attestation Statements:   Reviewed by clinician on day of visit: allergies, medications, problem list, medical history, surgical history, family history, social history, and previous encounter notes.  08/21/2019, am acting as Fernanda Drum for  Energy manager, DO   I have reviewed the above documentation for accuracy and completeness, and I agree with the above. [employer], DO

## 2020-01-29 ENCOUNTER — Other Ambulatory Visit: Payer: Self-pay | Admitting: Family Medicine

## 2020-01-29 DIAGNOSIS — E119 Type 2 diabetes mellitus without complications: Secondary | ICD-10-CM

## 2020-02-04 ENCOUNTER — Other Ambulatory Visit: Payer: Self-pay

## 2020-02-04 ENCOUNTER — Encounter (INDEPENDENT_AMBULATORY_CARE_PROVIDER_SITE_OTHER): Payer: Self-pay | Admitting: Bariatrics

## 2020-02-04 ENCOUNTER — Ambulatory Visit (INDEPENDENT_AMBULATORY_CARE_PROVIDER_SITE_OTHER): Payer: BC Managed Care – PPO | Admitting: Bariatrics

## 2020-02-04 VITALS — BP 122/77 | HR 63 | Temp 98.0°F | Ht 62.0 in | Wt 230.0 lb

## 2020-02-04 DIAGNOSIS — E669 Obesity, unspecified: Secondary | ICD-10-CM | POA: Diagnosis not present

## 2020-02-04 DIAGNOSIS — E559 Vitamin D deficiency, unspecified: Secondary | ICD-10-CM | POA: Diagnosis not present

## 2020-02-04 DIAGNOSIS — E1169 Type 2 diabetes mellitus with other specified complication: Secondary | ICD-10-CM | POA: Diagnosis not present

## 2020-02-04 DIAGNOSIS — Z6841 Body Mass Index (BMI) 40.0 and over, adult: Secondary | ICD-10-CM

## 2020-02-08 NOTE — Progress Notes (Signed)
Chief Complaint:   OBESITY Alicia Lowery is here to discuss her progress with her obesity treatment plan along with follow-up of her obesity related diagnoses. Alicia Lowery is on the Category 2 Plan and states she is following her eating plan approximately 80% of the time. Alicia Lowery states she is walking for 20 minutes 3 times per week.  Today's visit was #: 12 Starting weight: 246 lbs Starting date: 06/24/2019 Today's weight: 230 lbs Today's date: 02/04/2020 Total lbs lost to date: 16 lbs Total lbs lost since last in-office visit: 0  Interim History: Alicia Lowery is up 4 pounds, but has done well overall.  Subjective:   1. Diabetes mellitus type 2 in obese [facility]) Medications reviewed. Diabetic ROS: no polyuria or polydipsia, no chest pain, dyspnea or TIA's, no numbness, tingling or pain in extremities.  She is taking metformin.  Lab Results  Component Value Date   HGBA1C 6.4 (H) 10/15/2019   HGBA1C 7.1 (A) 06/23/2019   HGBA1C 7.1 (A) 02/12/2019   Lab Results  Component Value Date   MICROALBUR <5.0 06/23/2019   LDLCALC 179 (H) 06/23/2019   CREATININE 0.94 06/23/2019   Lab Results  Component Value Date   INSULIN 17.6 06/24/2019   2. Vitamin D deficiency Alicia Lowery's Vitamin D level was 57.6 on 10/15/2019. She is currently taking prescription vitamin D 50,000 IU each week. She denies nausea, vomiting or muscle weakness.  Assessment/Plan:   1. Diabetes mellitus type 2 in obese (HCC) Good blood sugar control is important to decrease the likelihood of diabetic complications such as nephropathy, neuropathy, limb loss, blindness, coronary artery disease, and death. Intensive lifestyle modification including diet, exercise and weight loss are the first line of treatment for diabetes.  Continue metformin.  2. Vitamin D deficiency Low Vitamin D level contributes to fatigue and are associated with obesity, breast, and colon cancer. She agrees to continue to take prescription Vitamin D @50 ,000 IU every  week and will follow-up for routine testing of Vitamin D, at least 2-3 times per year to avoid over-replacement.  3. Class 3 severe obesity with serious comorbidity and body mass index (BMI) of 40.0 to 44.9 in adult, unspecified obesity type [facility]) Alicia Lowery is currently in the action stage of change. As such, her goal is to continue with weight loss efforts. She has agreed to the Category 2 Plan.   She will work on meal planning and intentional eating.  Exercise goals: All adults should avoid inactivity. Some physical activity is better than none, and adults who participate in any amount of physical activity gain some health benefits.  Behavioral modification strategies: increasing lean protein intake, decreasing simple carbohydrates, increasing vegetables, increasing water intake, decreasing eating out, no skipping meals, meal planning and cooking strategies, keeping healthy foods in the home and planning for success.  Alicia Lowery has agreed to follow-up with our clinic in 3-4 weeks. She was informed of the importance of frequent follow-up visits to maximize her success with intensive lifestyle modifications for her multiple health conditions.   Objective:   Blood pressure 122/77, pulse 63, temperature 98 F (36.7 C), height 5\' 2"  (1.575 m), weight 230 lb (104.3 kg), SpO2 95 %. Body mass index is 42.07 kg/m.  General: Cooperative, alert, well developed, in no acute distress. HEENT: Conjunctivae and lids unremarkable. Cardiovascular: Regular rhythm.  Lungs: Normal work of breathing. Neurologic: No focal deficits.   Lab Results  Component Value Date   CREATININE 0.94 06/23/2019   BUN 13 06/23/2019   NA 141 06/23/2019  K 4.2 06/23/2019   CL 100 06/23/2019   CO2 25 06/23/2019   Lab Results  Component Value Date   ALT 29 06/23/2019   AST 19 06/23/2019   ALKPHOS 75 06/23/2019   BILITOT 1.1 06/23/2019   Lab Results  Component Value Date   HGBA1C 6.4 (H) 10/15/2019   HGBA1C 7.1 (A)  06/23/2019   HGBA1C 7.1 (A) 02/12/2019   HGBA1C 7.3 (A) 10/13/2018   HGBA1C 9.2 (A) 06/11/2018   Lab Results  Component Value Date   INSULIN 17.6 06/24/2019   Lab Results  Component Value Date   TSH 1.280 06/24/2019   Lab Results  Component Value Date   CHOL 247 (H) 06/23/2019   HDL 46 06/23/2019   LDLCALC 179 (H) 06/23/2019   TRIG 123 06/23/2019   CHOLHDL 5.4 (H) 06/23/2019   Lab Results  Component Value Date   WBC 3.7 06/23/2019   HGB 13.1 06/23/2019   HCT 39.4 06/23/2019   MCV 91 06/23/2019   PLT 233 06/23/2019   Attestation Statements:   Reviewed by clinician on day of visit: allergies, medications, problem list, medical history, surgical history, family history, social history, and previous encounter notes.  I, Insurance claims handler, CMA, am acting as Energy manager for [employer], DO  I have reviewed the above documentation for accuracy and completeness, and I agree with the above. Corinna Capra, DO

## 2020-02-09 ENCOUNTER — Encounter (INDEPENDENT_AMBULATORY_CARE_PROVIDER_SITE_OTHER): Payer: Self-pay | Admitting: Bariatrics

## 2020-02-25 ENCOUNTER — Other Ambulatory Visit: Payer: Self-pay | Admitting: Family Medicine

## 2020-02-25 DIAGNOSIS — I152 Hypertension secondary to endocrine disorders: Secondary | ICD-10-CM

## 2020-02-25 DIAGNOSIS — E1159 Type 2 diabetes mellitus with other circulatory complications: Secondary | ICD-10-CM

## 2020-03-01 ENCOUNTER — Other Ambulatory Visit (INDEPENDENT_AMBULATORY_CARE_PROVIDER_SITE_OTHER): Payer: Self-pay | Admitting: Bariatrics

## 2020-03-01 DIAGNOSIS — E559 Vitamin D deficiency, unspecified: Secondary | ICD-10-CM

## 2020-03-02 ENCOUNTER — Ambulatory Visit (INDEPENDENT_AMBULATORY_CARE_PROVIDER_SITE_OTHER): Payer: BC Managed Care – PPO | Admitting: Bariatrics

## 2020-03-15 ENCOUNTER — Other Ambulatory Visit: Payer: Self-pay

## 2020-03-15 ENCOUNTER — Encounter (INDEPENDENT_AMBULATORY_CARE_PROVIDER_SITE_OTHER): Payer: Self-pay | Admitting: Bariatrics

## 2020-03-15 ENCOUNTER — Ambulatory Visit (INDEPENDENT_AMBULATORY_CARE_PROVIDER_SITE_OTHER): Payer: PRIVATE HEALTH INSURANCE | Admitting: Bariatrics

## 2020-03-15 VITALS — BP 140/85 | HR 64 | Temp 98.0°F | Ht 62.0 in | Wt 235.0 lb

## 2020-03-15 DIAGNOSIS — E785 Hyperlipidemia, unspecified: Secondary | ICD-10-CM

## 2020-03-15 DIAGNOSIS — I152 Hypertension secondary to endocrine disorders: Secondary | ICD-10-CM

## 2020-03-15 DIAGNOSIS — E1169 Type 2 diabetes mellitus with other specified complication: Secondary | ICD-10-CM

## 2020-03-15 DIAGNOSIS — E559 Vitamin D deficiency, unspecified: Secondary | ICD-10-CM

## 2020-03-15 DIAGNOSIS — E669 Obesity, unspecified: Secondary | ICD-10-CM

## 2020-03-15 DIAGNOSIS — E1159 Type 2 diabetes mellitus with other circulatory complications: Secondary | ICD-10-CM

## 2020-03-15 DIAGNOSIS — Z9189 Other specified personal risk factors, not elsewhere classified: Secondary | ICD-10-CM

## 2020-03-15 DIAGNOSIS — Z6841 Body Mass Index (BMI) 40.0 and over, adult: Secondary | ICD-10-CM

## 2020-03-15 NOTE — Progress Notes (Signed)
Chief Complaint:   [name] is here to discuss her progress with her obesity treatment plan along with follow-up of her obesity related diagnoses. Alicia Lowery is on the Category 2 Plan and states she is following her eating plan approximately 85% of the time. [name] states she is walking 30 minutes 7 times per week.  Today's visit was #: 13 Starting weight: 246 lbs Starting date: 06/24/2019 Today's weight: 235 lbs Today's date: 03/15/2020 Total lbs lost to date: 11 Total lbs lost since last in-office visit: 0  Interim History: Alicia Lowery is up 5 lbs from her last visit. She states she is walking more and drinking more water.  Subjective:   Diabetes mellitus type 2 in obese (HCC). Alicia Lowery is taking metformin and Ozempic.   Lab Results  Component Value Date   HGBA1C 6.4 (H) 10/15/2019   HGBA1C 7.1 (A) 06/23/2019   HGBA1C 7.1 (A) 02/12/2019   Lab Results  Component Value Date   MICROALBUR <5.0 06/23/2019   LDLCALC 179 (H) 06/23/2019   CREATININE 0.94 06/23/2019   Lab Results  Component Value Date   INSULIN 17.6 06/24/2019   Hypertension associated with diabetes (HCC). [name] is taking Zestoretic.  BP Readings from Last 3 Encounters:  03/15/20 140/85  02/04/20 122/77  01/13/20 105/71   Lab Results  Component Value Date   CREATININE 0.94 06/23/2019   CREATININE 0.72 06/11/2018   CREATININE 0.79 04/16/2017   Vitamin D deficiency. Alicia Lowery is taking high dose Vitamin D supplementation.    Ref. Range 10/15/2019 14:45  Vitamin D, 25-Hydroxy Latest Ref Range: 30.0 - 100.0 ng/mL 57.6   Hyperlipidemia associated with type 2 diabetes mellitus (HCC). Alicia Lowery is taking Crestor.   Lab Results  Component Value Date   CHOL 247 (H) 06/23/2019   HDL 46 06/23/2019   LDLCALC 179 (H) 06/23/2019   TRIG 123 06/23/2019   CHOLHDL 5.4 (H) 06/23/2019   Lab Results  Component Value Date   ALT 29 06/23/2019   AST 19 06/23/2019   ALKPHOS 75 06/23/2019   BILITOT 1.1 06/23/2019    The 10-year ASCVD risk score [name], et al., 2013) is: 25.8%   Values used to calculate the score:     Age: 55 years     Sex: Female     Is Non-Hispanic African American: Yes     Diabetic: Yes     Tobacco smoker: No     Systolic Blood Pressure: 140 mmHg     Is BP treated: Yes     HDL Cholesterol: 46 mg/dL     Total Cholesterol: 247 mg/dL  At risk for osteoporosis. Alicia Lowery is at higher risk of osteopenia and osteoporosis due to Vitamin D deficiency.   Assessment/Plan:   Diabetes mellitus type 2 in obese (HCC).  Good blood sugar control is important to decrease the likelihood of diabetic complications such as nephropathy, neuropathy, limb loss, blindness, coronary artery disease, and death. Intensive lifestyle modification including diet, exercise and weight loss are the first line of treatment for diabetes. Alicia Lowery will continue her medications as directed. A1c and insulin levels will be checked today.  Hypertension associated with diabetes (HCC). Alicia Lowery is working on healthy weight loss and exercise to improve blood pressure control. We will watch for signs of hypotension as she continues her lifestyle modifications. She will continue Zestoretic as directed. CMP will be checked today.  Vitamin D deficiency. Low Vitamin D level contributes to fatigue and are associated with obesity, breast, and  colon cancer. Vitamin D level will be checked today.  Hyperlipidemia associated with type 2 diabetes mellitus (HCC). Cardiovascular risk and specific lipid/LDL goals reviewed.  We discussed several lifestyle modifications today and Alicia Lowery will continue to work on diet, exercise and weight loss efforts. Orders and follow up as documented in patient record. [name] will continue Crestor as directed.   Counseling Intensive lifestyle modifications are the first line treatment for this issue. . Dietary changes: Increase soluble fiber. Decrease simple carbohydrates. . Exercise changes: Moderate to  vigorous-intensity aerobic activity 150 minutes per week if tolerated. . Lipid-lowering medications: see documented in medical record.  At risk for osteoporosis. Alicia Lowery was given approximately 15 minutes of osteoporosis prevention counseling today. Alicia Lowery is at risk for osteopenia and osteoporosis due to her Vitamin D deficiency. She was encouraged to take her Vitamin D and follow her higher calcium diet and increase strengthening exercise to help strengthen her bones and decrease her risk of osteopenia and osteoporosis.  Repetitive spaced learning was employed today to elicit superior memory formation and behavioral change.  Class 3 severe obesity with serious comorbidity and body mass index (BMI) of 40.0 to 44.9 in adult, unspecified obesity type (HCC).  Alicia Lowery is currently in the action stage of change. As such, her goal is to continue with weight loss efforts. She has agreed to the Category 2 Plan.    She will work on meal planning, intentional eating, increasing her water intake (will use lemon/lime) and will measure her protein intake.   Exercise goals: All adults should avoid inactivity. Some physical activity is better than none, and adults who participate in any amount of physical activity gain some health benefits.  Behavioral modification strategies: increasing lean protein intake, decreasing simple carbohydrates, increasing vegetables, increasing water intake, decreasing eating out, no skipping meals, meal planning and cooking strategies, keeping healthy foods in the home and planning for success.  Alicia Lowery has agreed to follow-up with our clinic fasting in 2-3 weeks. She was informed of the importance of frequent follow-up visits to maximize her success with intensive lifestyle modifications for her multiple health conditions.   Alicia Lowery was informed we would discuss her lab results at her next visit unless there is a critical issue that needs to be addressed sooner. Alicia Lowery agreed to keep  her next visit at the agreed upon time to discuss these results.  Objective:   Blood pressure 140/85, pulse 64, temperature 98 F (36.7 C), height 5\' 2"  (1.575 m), weight 235 lb (106.6 kg). Body mass index is 42.98 kg/m.  General: Cooperative, alert, well developed, in no acute distress. HEENT: Conjunctivae and lids unremarkable. Cardiovascular: Regular rhythm.  Lungs: Normal work of breathing. Neurologic: No focal deficits.   Lab Results  Component Value Date   CREATININE 0.94 06/23/2019   BUN 13 06/23/2019   NA 141 06/23/2019   K 4.2 06/23/2019   CL 100 06/23/2019   CO2 25 06/23/2019   Lab Results  Component Value Date   ALT 29 06/23/2019   AST 19 06/23/2019   ALKPHOS 75 06/23/2019   BILITOT 1.1 06/23/2019   Lab Results  Component Value Date   HGBA1C 6.4 (H) 10/15/2019   HGBA1C 7.1 (A) 06/23/2019   HGBA1C 7.1 (A) 02/12/2019   HGBA1C 7.3 (A) 10/13/2018   HGBA1C 9.2 (A) 06/11/2018   Lab Results  Component Value Date   INSULIN 17.6 06/24/2019   Lab Results  Component Value Date   TSH 1.280 06/24/2019   Lab Results  Component Value  Date   CHOL 247 (H) 06/23/2019   HDL 46 06/23/2019   LDLCALC 179 (H) 06/23/2019   TRIG 123 06/23/2019   CHOLHDL 5.4 (H) 06/23/2019   Lab Results  Component Value Date   WBC 3.7 06/23/2019   HGB 13.1 06/23/2019   HCT 39.4 06/23/2019   MCV 91 06/23/2019   PLT 233 06/23/2019   No results found for: IRON, TIBC, FERRITIN  Attestation Statements:   Reviewed by clinician on day of visit: allergies, medications, problem list, medical history, surgical history, family history, social history, and previous encounter notes.  Fernanda Drum, am acting as Energy manager for [employer], DO   I have reviewed the above documentation for accuracy and completeness, and I agree with the above. Corinna Capra, DO

## 2020-03-16 ENCOUNTER — Encounter (INDEPENDENT_AMBULATORY_CARE_PROVIDER_SITE_OTHER): Payer: Self-pay | Admitting: Bariatrics

## 2020-03-16 LAB — INSULIN, RANDOM: INSULIN: 14.2 u[IU]/mL (ref 2.6–24.9)

## 2020-03-16 LAB — LIPID PANEL WITH LDL/HDL RATIO
Cholesterol, Total: 161 mg/dL (ref 100–199)
HDL: 48 mg/dL (ref >39)
LDL Chol Calc (NIH): 96 mg/dL (ref 0–99)
LDL/HDL Ratio: 2 ratio (ref 0.0–3.2)
Triglycerides: 90 mg/dL (ref 0–149)
VLDL Cholesterol Cal: 17 mg/dL (ref 5–40)

## 2020-03-16 LAB — COMPREHENSIVE METABOLIC PANEL
ALT: 25 IU/L (ref 0–32)
AST: 20 IU/L (ref 0–40)
Albumin/Globulin Ratio: 1.6 (ref 1.2–2.2)
Albumin: 4.5 g/dL (ref 3.8–4.9)
Alkaline Phosphatase: 67 IU/L (ref 44–121)
BUN/Creatinine Ratio: 16 (ref 12–28)
BUN: 15 mg/dL (ref 8–27)
Bilirubin Total: 0.8 mg/dL (ref 0.0–1.2)
CO2: 28 mmol/L (ref 20–29)
Calcium: 10 mg/dL (ref 8.7–10.3)
Chloride: 101 mmol/L (ref 96–106)
Creatinine, Ser: 0.92 mg/dL (ref 0.57–1.00)
GFR calc Af Amer: 78 mL/min/{1.73_m2} (ref >59)
GFR calc non Af Amer: 68 mL/min/{1.73_m2} (ref >59)
Globulin, Total: 2.9 g/dL (ref 1.5–4.5)
Glucose: 96 mg/dL (ref 65–99)
Potassium: 4 mmol/L (ref 3.5–5.2)
Sodium: 140 mmol/L (ref 134–144)
Total Protein: 7.4 g/dL (ref 6.0–8.5)

## 2020-03-16 LAB — HEMOGLOBIN A1C
Est. average glucose Bld gHb Est-mCnc: 137 mg/dL
Hgb A1c MFr Bld: 6.4 % — ABNORMAL HIGH (ref 4.8–5.6)

## 2020-03-16 LAB — VITAMIN D 25 HYDROXY (VIT D DEFICIENCY, FRACTURES): Vit D, 25-Hydroxy: 55.5 ng/mL (ref 30.0–100.0)

## 2020-03-29 ENCOUNTER — Encounter (INDEPENDENT_AMBULATORY_CARE_PROVIDER_SITE_OTHER): Payer: Self-pay | Admitting: Bariatrics

## 2020-03-29 ENCOUNTER — Other Ambulatory Visit: Payer: Self-pay

## 2020-03-29 ENCOUNTER — Ambulatory Visit (INDEPENDENT_AMBULATORY_CARE_PROVIDER_SITE_OTHER): Payer: PRIVATE HEALTH INSURANCE | Admitting: Bariatrics

## 2020-03-29 VITALS — BP 137/89 | HR 67 | Temp 98.1°F | Ht 62.0 in | Wt 232.0 lb

## 2020-03-29 DIAGNOSIS — Z9189 Other specified personal risk factors, not elsewhere classified: Secondary | ICD-10-CM | POA: Diagnosis not present

## 2020-03-29 DIAGNOSIS — E1159 Type 2 diabetes mellitus with other circulatory complications: Secondary | ICD-10-CM | POA: Diagnosis not present

## 2020-03-29 DIAGNOSIS — Z6841 Body Mass Index (BMI) 40.0 and over, adult: Secondary | ICD-10-CM

## 2020-03-29 DIAGNOSIS — E1169 Type 2 diabetes mellitus with other specified complication: Secondary | ICD-10-CM | POA: Diagnosis not present

## 2020-03-29 DIAGNOSIS — E785 Hyperlipidemia, unspecified: Secondary | ICD-10-CM

## 2020-03-29 DIAGNOSIS — I152 Hypertension secondary to endocrine disorders: Secondary | ICD-10-CM

## 2020-03-29 NOTE — Progress Notes (Signed)
Chief Complaint:   OBESITY Alicia Lowery is here to discuss her progress with her obesity treatment plan along with follow-up of her obesity related diagnoses. Alicia Lowery is on the Category 1 Plan or the Category 2 Plan and states she is following her eating plan approximately 90% of the time. Alicia Lowery states she is walking 20 minutes 4 times per week.  Today's visit was #: 14 Starting weight: 246 lbs Starting date: 06/24/2019 Today's weight: 232 lbs Today's date: 03/29/2020 Total lbs lost to date: 14 Total lbs lost since last in-office visit: 3  Interim History: Alicia Lowery's weight is down an additional 3 lbs.  Subjective:   Hyperlipidemia associated with type 2 diabetes mellitus (HCC). Alicia Lowery is taking Crestor and reports taking it as directed.    Lab Results  Component Value Date   CHOL 161 03/15/2020   HDL 48 03/15/2020   LDLCALC 96 03/15/2020   TRIG 90 03/15/2020   CHOLHDL 5.4 (H) 06/23/2019   Lab Results  Component Value Date   ALT 25 03/15/2020   AST 20 03/15/2020   ALKPHOS 67 03/15/2020   BILITOT 0.8 03/15/2020   The 10-year ASCVD risk score [name], et al., 2013) is: 16.7%   Values used to calculate the score:     Age: 59 years     Sex: Female     Is Non-Hispanic African American: Yes     Diabetic: Yes     Tobacco smoker: No     Systolic Blood Pressure: 137 mmHg     Is BP treated: Yes     HDL Cholesterol: 48 mg/dL     Total Cholesterol: 161 mg/dL   Hypertension associated with type 2 diabetes mellitus (HCC). Alicia Lowery reports taking her medications as directed. Blood pressure is reasonably well controlled.  BP Readings from Last 3 Encounters:  03/29/20 137/89  03/15/20 140/85  02/04/20 122/77   Lab Results  Component Value Date   CREATININE 0.92 03/15/2020   CREATININE 0.94 06/23/2019   CREATININE 0.72 06/11/2018   At risk for heart disease. Alicia Lowery is at a higher than average risk for cardiovascular disease due to hypertension and hyperlipidemia.    Assessment/Plan:   Hyperlipidemia associated with type 2 diabetes mellitus (HCC). Cardiovascular risk and specific lipid/LDL goals reviewed.  We discussed several lifestyle modifications today and Adalin will continue to work on diet, exercise and weight loss efforts. Orders and follow up as documented in patient record. She will continue her medication as directed.   Counseling Intensive lifestyle modifications are the first line treatment for this issue. . Dietary changes: Increase soluble fiber. Decrease simple carbohydrates. . Exercise changes: Moderate to vigorous-intensity aerobic activity 150 minutes per week if tolerated. . Lipid-lowering medications: see documented in medical record.  Hypertension associated with type 2 diabetes mellitus (HCC). Alicia Lowery is working on healthy weight loss and exercise to improve blood pressure control. We will watch for signs of hypotension as she continues her lifestyle modifications. She will continue her medications as directed.   At risk for heart disease. Alicia Lowery was given approximately 15 minutes of coronary artery disease prevention counseling today. She is 59 y.o. female and has risk factors for heart disease including obesity. We discussed intensive lifestyle modifications today with an emphasis on specific weight loss instructions and strategies.   Repetitive spaced learning was employed today to elicit superior memory formation and behavioral change.  Class 3 severe obesity with serious comorbidity and body mass index (BMI) of 40.0 to 44.9  in adult, unspecified obesity type (HCC).  Alicia Lowery is currently in the action stage of change. As such, her goal is to continue with weight loss efforts. She has agreed to the Category 1 Plan or the Category 2 Plan.   She will work on meal planning, intentional eating, and increasing her protein intake.   We reviewed with the patient labs from 03/15/2020 including CMP, lipids, Vitamin D, A1c, and  insulin.  Exercise goals: All adults should avoid inactivity. Some physical activity is better than none, and adults who participate in any amount of physical activity gain some health benefits.  Behavioral modification strategies: increasing lean protein intake, decreasing simple carbohydrates, increasing vegetables, increasing water intake, better snacking choices, travel eating strategies, holiday eating strategies  and celebration eating strategies.  Alicia Lowery has agreed to follow-up with our clinic in 2 weeks. She was informed of the importance of frequent follow-up visits to maximize her success with intensive lifestyle modifications for her multiple health conditions.   Objective:   Blood pressure 137/89, pulse 67, temperature 98.1 F (36.7 C), height 5\' 2"  (1.575 m), weight 232 lb (105.2 kg), SpO2 94 %. Body mass index is 42.43 kg/m.  General: Cooperative, alert, well developed, in no acute distress. HEENT: Conjunctivae and lids unremarkable. Cardiovascular: Regular rhythm.  Lungs: Normal work of breathing. Neurologic: No focal deficits.   Lab Results  Component Value Date   CREATININE 0.92 03/15/2020   BUN 15 03/15/2020   NA 140 03/15/2020   K 4.0 03/15/2020   CL 101 03/15/2020   CO2 28 03/15/2020   Lab Results  Component Value Date   ALT 25 03/15/2020   AST 20 03/15/2020   ALKPHOS 67 03/15/2020   BILITOT 0.8 03/15/2020   Lab Results  Component Value Date   HGBA1C 6.4 (H) 03/15/2020   HGBA1C 6.4 (H) 10/15/2019   HGBA1C 7.1 (A) 06/23/2019   HGBA1C 7.1 (A) 02/12/2019   HGBA1C 7.3 (A) 10/13/2018   Lab Results  Component Value Date   INSULIN 14.2 03/15/2020   INSULIN 17.6 06/24/2019   Lab Results  Component Value Date   TSH 1.280 06/24/2019   Lab Results  Component Value Date   CHOL 161 03/15/2020   HDL 48 03/15/2020   LDLCALC 96 03/15/2020   TRIG 90 03/15/2020   CHOLHDL 5.4 (H) 06/23/2019   Lab Results  Component Value Date   WBC 3.7 06/23/2019    HGB 13.1 06/23/2019   HCT 39.4 06/23/2019   MCV 91 06/23/2019   PLT 233 06/23/2019   No results found for: IRON, TIBC, FERRITIN  Attestation Statements:   Reviewed by clinician on day of visit: allergies, medications, problem list, medical history, surgical history, family history, social history, and previous encounter notes.  08/21/2019, am acting as Fernanda Drum for Energy manager, DO    I have reviewed the above documentation for accuracy and completeness, and I agree with the above. [employer], DO

## 2020-03-30 ENCOUNTER — Encounter (INDEPENDENT_AMBULATORY_CARE_PROVIDER_SITE_OTHER): Payer: Self-pay | Admitting: Bariatrics

## 2020-04-11 ENCOUNTER — Ambulatory Visit (INDEPENDENT_AMBULATORY_CARE_PROVIDER_SITE_OTHER): Payer: PRIVATE HEALTH INSURANCE | Admitting: Bariatrics

## 2020-04-11 ENCOUNTER — Other Ambulatory Visit: Payer: Self-pay

## 2020-04-11 ENCOUNTER — Encounter (INDEPENDENT_AMBULATORY_CARE_PROVIDER_SITE_OTHER): Payer: Self-pay | Admitting: Bariatrics

## 2020-04-11 VITALS — BP 129/80 | HR 75 | Temp 98.3°F | Ht 62.0 in | Wt 232.0 lb

## 2020-04-11 DIAGNOSIS — I152 Hypertension secondary to endocrine disorders: Secondary | ICD-10-CM | POA: Diagnosis not present

## 2020-04-11 DIAGNOSIS — E1159 Type 2 diabetes mellitus with other circulatory complications: Secondary | ICD-10-CM

## 2020-04-11 DIAGNOSIS — E559 Vitamin D deficiency, unspecified: Secondary | ICD-10-CM | POA: Diagnosis not present

## 2020-04-11 DIAGNOSIS — Z6841 Body Mass Index (BMI) 40.0 and over, adult: Secondary | ICD-10-CM

## 2020-04-12 ENCOUNTER — Encounter (INDEPENDENT_AMBULATORY_CARE_PROVIDER_SITE_OTHER): Payer: Self-pay | Admitting: Bariatrics

## 2020-04-12 NOTE — Progress Notes (Signed)
Chief Complaint:   OBESITY Alicia Lowery is here to discuss her progress with her obesity treatment plan along with follow-up of her obesity related diagnoses. Alicia Lowery is on the Category 2 Plan and states she is following her eating plan approximately 80% of the time. Alicia Lowery states she is walking 20 minutes 4 times per week.  Today's visit was #: 15 Starting weight: 246 lbs Starting date: 06/24/2019 Today's weight: 232 lbs Today's date: 04/11/2020 Total lbs lost to date: 14 Total lbs lost since last in-office visit: 0  Interim History: Alicia Lowery weight remains the same since her last visit. She reports doing okay with her water and protein intake.  Subjective:   Vitamin D deficiency. Alicia Lowery is taking Vitamin D supplementation.    Ref. Range 03/15/2020 12:37  Vitamin D, 25-Hydroxy Latest Ref Range: 30.0 - 100.0 ng/mL 55.5   Hypertension associated with type 2 diabetes mellitus (HCC). Alicia Lowery is taking Zestoretic.  BP Readings from Last 3 Encounters:  04/11/20 129/80  03/29/20 137/89  03/15/20 140/85   Lab Results  Component Value Date   CREATININE 0.92 03/15/2020   CREATININE 0.94 06/23/2019   CREATININE 0.72 06/11/2018   Assessment/Plan:   Vitamin D deficiency. Low Vitamin D level contributes to fatigue and are associated with obesity, breast, and colon cancer. She agrees to continue to take Vitamin D as directed and will follow-up for routine testing of Vitamin D, at least 2-3 times per year to avoid over-replacement.  Hypertension associated with type 2 diabetes mellitus (HCC). Alicia Lowery is working on healthy weight loss and exercise to improve blood pressure control. We will watch for signs of hypotension as she continues her lifestyle modifications. She will continue her medication as directed.   Class 3 severe obesity with serious comorbidity and body mass index (BMI) of 40.0 to 44.9 in adult, unspecified obesity type (HCC).  Alicia Lowery is currently in the action stage of  change. As such, her goal is to continue with weight loss efforts. She has agreed to the Category 2 Plan.   She will work on meal planning and adhering closely to the plan.  Handout was provided on Recipes.  Exercise goals: Alicia Lowery will continue walking 20 minutes 4 times per week.  Behavioral modification strategies: increasing lean protein intake, decreasing simple carbohydrates, increasing vegetables, increasing water intake, decreasing eating out, no skipping meals, meal planning and cooking strategies, keeping healthy foods in the home and planning for success.  Alicia Lowery has agreed to follow-up with our clinic in 2-3 weeks. She was informed of the importance of frequent follow-up visits to maximize her success with intensive lifestyle modifications for her multiple health conditions.   Objective:   Blood pressure 129/80, pulse 75, temperature 98.3 F (36.8 C), height 5\' 2"  (1.575 m), weight 232 lb (105.2 kg), SpO2 97 %. Body mass index is 42.43 kg/m.  General: Cooperative, alert, well developed, in no acute distress. HEENT: Conjunctivae and lids unremarkable. Cardiovascular: Regular rhythm.  Lungs: Normal work of breathing. Neurologic: No focal deficits.   Lab Results  Component Value Date   CREATININE 0.92 03/15/2020   BUN 15 03/15/2020   NA 140 03/15/2020   K 4.0 03/15/2020   CL 101 03/15/2020   CO2 28 03/15/2020   Lab Results  Component Value Date   ALT 25 03/15/2020   AST 20 03/15/2020   ALKPHOS 67 03/15/2020   BILITOT 0.8 03/15/2020   Lab Results  Component Value Date   HGBA1C 6.4 (H) 03/15/2020  HGBA1C 6.4 (H) 10/15/2019   HGBA1C 7.1 (A) 06/23/2019   HGBA1C 7.1 (A) 02/12/2019   HGBA1C 7.3 (A) 10/13/2018   Lab Results  Component Value Date   INSULIN 14.2 03/15/2020   INSULIN 17.6 06/24/2019   Lab Results  Component Value Date   TSH 1.280 06/24/2019   Lab Results  Component Value Date   CHOL 161 03/15/2020   HDL 48 03/15/2020   LDLCALC 96  03/15/2020   TRIG 90 03/15/2020   CHOLHDL 5.4 (H) 06/23/2019   Lab Results  Component Value Date   WBC 3.7 06/23/2019   HGB 13.1 06/23/2019   HCT 39.4 06/23/2019   MCV 91 06/23/2019   PLT 233 06/23/2019   No results found for: IRON, TIBC, FERRITIN  Attestation Statements:   Reviewed by clinician on day of visit: allergies, medications, problem list, medical history, surgical history, family history, social history, and previous encounter notes.  Time spent on visit including pre-visit chart review and post-visit charting and care was 20 minutes.   Fernanda Drum, am acting as Energy manager for [employer], DO   I have reviewed the above documentation for accuracy and completeness, and I agree with the above. Corinna Capra, DO

## 2020-04-29 ENCOUNTER — Other Ambulatory Visit (INDEPENDENT_AMBULATORY_CARE_PROVIDER_SITE_OTHER): Payer: Self-pay | Admitting: Bariatrics

## 2020-04-29 DIAGNOSIS — E559 Vitamin D deficiency, unspecified: Secondary | ICD-10-CM

## 2020-05-02 ENCOUNTER — Encounter (INDEPENDENT_AMBULATORY_CARE_PROVIDER_SITE_OTHER): Payer: Self-pay | Admitting: Bariatrics

## 2020-05-02 ENCOUNTER — Other Ambulatory Visit: Payer: Self-pay

## 2020-05-02 ENCOUNTER — Ambulatory Visit (INDEPENDENT_AMBULATORY_CARE_PROVIDER_SITE_OTHER): Payer: PRIVATE HEALTH INSURANCE | Admitting: Bariatrics

## 2020-05-02 VITALS — BP 120/72 | HR 73 | Temp 98.6°F | Ht 62.0 in | Wt 231.0 lb

## 2020-05-02 DIAGNOSIS — E559 Vitamin D deficiency, unspecified: Secondary | ICD-10-CM | POA: Diagnosis not present

## 2020-05-02 DIAGNOSIS — Z9189 Other specified personal risk factors, not elsewhere classified: Secondary | ICD-10-CM

## 2020-05-02 DIAGNOSIS — Z6841 Body Mass Index (BMI) 40.0 and over, adult: Secondary | ICD-10-CM

## 2020-05-02 DIAGNOSIS — E669 Obesity, unspecified: Secondary | ICD-10-CM

## 2020-05-02 DIAGNOSIS — E1169 Type 2 diabetes mellitus with other specified complication: Secondary | ICD-10-CM

## 2020-05-02 MED ORDER — VITAMIN D (ERGOCALCIFEROL) 1.25 MG (50000 UNIT) PO CAPS: ORAL_CAPSULE | ORAL | 0 refills | Status: DC

## 2020-05-02 NOTE — Telephone Encounter (Signed)
This patient was last seen by Dr. Brown, and currently has an upcoming appt scheduled on 05/02/20 with her.  

## 2020-05-02 NOTE — Progress Notes (Signed)
Chief Complaint:   OBESITY Alicia Lowery is here to discuss her progress with her obesity treatment plan along with follow-up of her obesity related diagnoses. Vanity is on the Category 2 Plan and states she is following her eating plan approximately 80% of the time. [name] states she is walking 30 minutes 3 times per week.  Today's visit was #: 16 Starting weight: 246 lbs Starting date: 06/24/2019 Today's weight: 231 lbs Today's date: 05/02/2020 Total lbs lost to date: 15 Total lbs lost since last in-office visit: 1  Interim History: Alicia Lowery's weight is down 1 lb and she has done well overall. She reports doing okay with her water intake.  Subjective:   Vitamin D deficiency. [name] reports minimal to no sunlight exposure.   Ref. Range 03/15/2020 12:37  Vitamin D, 25-Hydroxy Latest Ref Range: 30.0 - 100.0 ng/mL 55.5   Diabetes mellitus type 2 in obese (HCC). Alicia Lowery is taking metformin and Ozempic. She denies signs or symptoms of elevated or low blood sugars.   Lab Results  Component Value Date   HGBA1C 6.4 (H) 03/15/2020   HGBA1C 6.4 (H) 10/15/2019   HGBA1C 7.1 (A) 06/23/2019   Lab Results  Component Value Date   MICROALBUR <5.0 06/23/2019   LDLCALC 96 03/15/2020   CREATININE 0.92 03/15/2020   Lab Results  Component Value Date   INSULIN 14.2 03/15/2020   INSULIN 17.6 06/24/2019   At risk for hypoglycemia. [name] is at increased risk for hypoglycemia due to diabetes mellitus type II.   Assessment/Plan:   Vitamin D deficiency. Low Vitamin D level contributes to fatigue and are associated with obesity, breast, and colon cancer. She was given a prescription for Vitamin D, Ergocalciferol, (DRISDOL) 1.25 MG (50000 UNIT) CAPS capsule every other week #2 with 0 refills and will follow-up for routine testing of Vitamin D, at least 2-3 times per year to avoid over-replacement.   Diabetes mellitus type 2 in obese (HCC). Good blood sugar control is important to decrease the  likelihood of diabetic complications such as nephropathy, neuropathy, limb loss, blindness, coronary artery disease, and death. Intensive lifestyle modification including diet, exercise and weight loss are the first line of treatment for diabetes. Alicia Lowery will continue her medications as directed.   At risk for hypoglycemia. Alicia Lowery was given approximately 15 minutes of counseling today regarding prevention of hypoglycemia. She was advised of symptoms of hypoglycemia. Alicia Lowery was instructed to avoid skipping meals, eat regular protein rich meals and schedule low calorie snacks as needed.   Repetitive spaced learning was employed today to elicit superior memory formation and behavioral change.   Class 3 severe obesity with serious comorbidity and body mass index (BMI) of 40.0 to 44.9 in adult, unspecified obesity type (HCC).  Alicia Lowery is currently in the action stage of change. As such, her goal is to continue with weight loss efforts. She has agreed to the Category 2 Plan.   She will work on meal planning, intentional eating, and increasing her water intake.   Exercise goals: [name] will continue walking 30 minutes 3 times per week.  Behavioral modification strategies: increasing lean protein intake, decreasing simple carbohydrates, increasing vegetables, increasing water intake, decreasing eating out, no skipping meals, meal planning and cooking strategies, keeping healthy foods in the home, dealing with family or coworker sabotage, travel eating strategies, holiday eating strategies , celebration eating strategies and planning for success.  Alicia Lowery has agreed to follow-up with our clinic in 2-3 weeks. She was informed of the importance  of frequent follow-up visits to maximize her success with intensive lifestyle modifications for her multiple health conditions.   Objective:   Blood pressure 120/72, pulse 73, temperature 98.6 F (37 C), height 5\' 2"  (1.575 m), weight 231 lb (104.8 kg), SpO2 96  %. Body mass index is 42.25 kg/m.  General: Cooperative, alert, well developed, in no acute distress. HEENT: Conjunctivae and lids unremarkable. Cardiovascular: Regular rhythm.  Lungs: Normal work of breathing. Neurologic: No focal deficits.   Lab Results  Component Value Date   CREATININE 0.92 03/15/2020   BUN 15 03/15/2020   NA 140 03/15/2020   K 4.0 03/15/2020   CL 101 03/15/2020   CO2 28 03/15/2020   Lab Results  Component Value Date   ALT 25 03/15/2020   AST 20 03/15/2020   ALKPHOS 67 03/15/2020   BILITOT 0.8 03/15/2020   Lab Results  Component Value Date   HGBA1C 6.4 (H) 03/15/2020   HGBA1C 6.4 (H) 10/15/2019   HGBA1C 7.1 (A) 06/23/2019   HGBA1C 7.1 (A) 02/12/2019   HGBA1C 7.3 (A) 10/13/2018   Lab Results  Component Value Date   INSULIN 14.2 03/15/2020   INSULIN 17.6 06/24/2019   Lab Results  Component Value Date   TSH 1.280 06/24/2019   Lab Results  Component Value Date   CHOL 161 03/15/2020   HDL 48 03/15/2020   LDLCALC 96 03/15/2020   TRIG 90 03/15/2020   CHOLHDL 5.4 (H) 06/23/2019   Lab Results  Component Value Date   WBC 3.7 06/23/2019   HGB 13.1 06/23/2019   HCT 39.4 06/23/2019   MCV 91 06/23/2019   PLT 233 06/23/2019   No results found for: IRON, TIBC, FERRITIN  Attestation Statements:   Reviewed by clinician on day of visit: allergies, medications, problem list, medical history, surgical history, family history, social history, and previous encounter notes.  08/21/2019, am acting as Fernanda Drum for Energy manager, DO   I have reviewed the above documentation for accuracy and completeness, and I agree with the above. [employer], DO

## 2020-05-03 ENCOUNTER — Encounter (INDEPENDENT_AMBULATORY_CARE_PROVIDER_SITE_OTHER): Payer: Self-pay | Admitting: Bariatrics

## 2020-05-23 ENCOUNTER — Ambulatory Visit (INDEPENDENT_AMBULATORY_CARE_PROVIDER_SITE_OTHER): Payer: PRIVATE HEALTH INSURANCE | Admitting: Bariatrics

## 2020-05-23 ENCOUNTER — Other Ambulatory Visit: Payer: Self-pay

## 2020-05-23 ENCOUNTER — Encounter (INDEPENDENT_AMBULATORY_CARE_PROVIDER_SITE_OTHER): Payer: Self-pay | Admitting: Bariatrics

## 2020-05-23 VITALS — BP 125/82 | HR 70 | Temp 98.3°F | Ht 62.0 in | Wt 232.0 lb

## 2020-05-23 DIAGNOSIS — E1159 Type 2 diabetes mellitus with other circulatory complications: Secondary | ICD-10-CM

## 2020-05-23 DIAGNOSIS — E1169 Type 2 diabetes mellitus with other specified complication: Secondary | ICD-10-CM

## 2020-05-23 DIAGNOSIS — I152 Hypertension secondary to endocrine disorders: Secondary | ICD-10-CM

## 2020-05-23 DIAGNOSIS — Z6841 Body Mass Index (BMI) 40.0 and over, adult: Secondary | ICD-10-CM

## 2020-05-23 DIAGNOSIS — E785 Hyperlipidemia, unspecified: Secondary | ICD-10-CM | POA: Diagnosis not present

## 2020-05-25 NOTE — Progress Notes (Signed)
Chief Complaint:   OBESITY Alicia Lowery is here to discuss her progress with her obesity treatment plan along with follow-up of her obesity related diagnoses. Alicia Lowery is on the Category 2 Plan and states she is following her eating plan approximately 50% of the time. Alicia Lowery states she is walking for 20 minutes 4 times per week.  Today's visit was #: 17 Starting weight: 246 lbs Starting date: 06/24/2019 Today's weight: 232 lbs Today's date: 05/23/2020 Total lbs lost to date: 14 lbs Total lbs lost since last in-office visit: 0  Interim History: Alicia Lowery is up 1 pound and has done okay overall.  Subjective:   1. Hyperlipidemia associated with type 2 diabetes mellitus (HCC) Alicia Lowery has hyperlipidemia and has been trying to improve her cholesterol levels with intensive lifestyle modification including a low saturated fat diet, exercise and weight loss. She denies any chest pain, claudication or myalgias.  She is taking Crestor 20 mg daily.  Lab Results  Component Value Date   ALT 25 03/15/2020   AST 20 03/15/2020   ALKPHOS 67 03/15/2020   BILITOT 0.8 03/15/2020   Lab Results  Component Value Date   CHOL 161 03/15/2020   HDL 48 03/15/2020   LDLCALC 96 03/15/2020   TRIG 90 03/15/2020   CHOLHDL 5.4 (H) 06/23/2019   2. Hypertension associated with type 2 diabetes mellitus (HCC) Review: taking medications as instructed, no medication side effects noted, no chest pain on exertion, no dyspnea on exertion, no swelling of ankles.  She is taking Zestoretic daily.  BP Readings from Last 3 Encounters:  05/23/20 125/82  05/02/20 120/72  04/11/20 129/80   Assessment/Plan:   1. Hyperlipidemia associated with type 2 diabetes mellitus (HCC) Cardiovascular risk and specific lipid/LDL goals reviewed.  We discussed several lifestyle modifications today and Alicia Lowery will continue to work on diet, exercise and weight loss efforts. Continue Crestor.   Counseling Intensive lifestyle modifications are  the first line treatment for this issue. . Dietary changes: Increase soluble fiber. Decrease simple carbohydrates. . Exercise changes: Moderate to vigorous-intensity aerobic activity 150 minutes per week if tolerated. . Lipid-lowering medications: see documented in medical record.  2. Hypertension associated with type 2 diabetes mellitus (HCC) Alicia Lowery is working on healthy weight loss and exercise to improve blood pressure control. We will watch for signs of hypotension as she continues her lifestyle modifications.  Continue medication.   3. Class 3 severe obesity with serious comorbidity and body mass index (BMI) of 40.0 to 44.9 in adult, unspecified obesity type [facility])  Alicia Lowery is currently in the action stage of change. As such, her goal is to continue with weight loss efforts. She has agreed to the Category 2 Plan.   She will work on meal planning, intentional eating, increasing water and protein intake, and will eat 2 meals (prn protein shakes).  Exercise goals: Walking.  Behavioral modification strategies: increasing lean protein intake, decreasing simple carbohydrates, increasing vegetables, increasing water intake, decreasing eating out, no skipping meals, meal planning and cooking strategies, keeping healthy foods in the home and planning for success.  Alicia Lowery has agreed to follow-up with our clinic in 2-3 weeks. She was informed of the importance of frequent follow-up visits to maximize her success with intensive lifestyle modifications for her multiple health conditions.   Objective:   Blood pressure 125/82, pulse 70, temperature 98.3 F (36.8 C), temperature source Oral, height 5\' 2"  (1.575 m), weight 232 lb (105.2 kg), SpO2 96 %. Body mass index is 42.43 kg/m.  General: Cooperative, alert, well developed, in no acute distress. HEENT: Conjunctivae and lids unremarkable. Cardiovascular: Regular rhythm.  Lungs: Normal work of breathing. Neurologic: No focal deficits.   Lab  Results  Component Value Date   CREATININE 0.92 03/15/2020   BUN 15 03/15/2020   NA 140 03/15/2020   K 4.0 03/15/2020   CL 101 03/15/2020   CO2 28 03/15/2020   Lab Results  Component Value Date   ALT 25 03/15/2020   AST 20 03/15/2020   ALKPHOS 67 03/15/2020   BILITOT 0.8 03/15/2020   Lab Results  Component Value Date   HGBA1C 6.4 (H) 03/15/2020   HGBA1C 6.4 (H) 10/15/2019   HGBA1C 7.1 (A) 06/23/2019   HGBA1C 7.1 (A) 02/12/2019   HGBA1C 7.3 (A) 10/13/2018   Lab Results  Component Value Date   INSULIN 14.2 03/15/2020   INSULIN 17.6 06/24/2019   Lab Results  Component Value Date   TSH 1.280 06/24/2019   Lab Results  Component Value Date   CHOL 161 03/15/2020   HDL 48 03/15/2020   LDLCALC 96 03/15/2020   TRIG 90 03/15/2020   CHOLHDL 5.4 (H) 06/23/2019   Lab Results  Component Value Date   WBC 3.7 06/23/2019   HGB 13.1 06/23/2019   HCT 39.4 06/23/2019   MCV 91 06/23/2019   PLT 233 06/23/2019   Attestation Statements:   Reviewed by clinician on day of visit: allergies, medications, problem list, medical history, surgical history, family history, social history, and previous encounter notes.  Time spent on visit including pre-visit chart review and post-visit care and charting was 20 minutes.   I, Insurance claims handler, CMA, am acting as Energy manager for [employer], DO  I have reviewed the above documentation for accuracy and completeness, and I agree with the above. Corinna Capra, DO

## 2020-05-26 ENCOUNTER — Encounter (INDEPENDENT_AMBULATORY_CARE_PROVIDER_SITE_OTHER): Payer: Self-pay | Admitting: Bariatrics

## 2020-06-06 ENCOUNTER — Other Ambulatory Visit (INDEPENDENT_AMBULATORY_CARE_PROVIDER_SITE_OTHER): Payer: Self-pay | Admitting: Bariatrics

## 2020-06-06 DIAGNOSIS — E559 Vitamin D deficiency, unspecified: Secondary | ICD-10-CM

## 2020-06-07 ENCOUNTER — Encounter (INDEPENDENT_AMBULATORY_CARE_PROVIDER_SITE_OTHER): Payer: Self-pay | Admitting: Bariatrics

## 2020-06-07 ENCOUNTER — Ambulatory Visit (INDEPENDENT_AMBULATORY_CARE_PROVIDER_SITE_OTHER): Payer: PRIVATE HEALTH INSURANCE | Admitting: Bariatrics

## 2020-06-07 ENCOUNTER — Other Ambulatory Visit: Payer: Self-pay

## 2020-06-07 VITALS — BP 125/80 | HR 75 | Temp 98.3°F | Ht 62.0 in | Wt 238.0 lb

## 2020-06-07 DIAGNOSIS — Z6841 Body Mass Index (BMI) 40.0 and over, adult: Secondary | ICD-10-CM

## 2020-06-07 DIAGNOSIS — E1169 Type 2 diabetes mellitus with other specified complication: Secondary | ICD-10-CM

## 2020-06-07 DIAGNOSIS — I10 Essential (primary) hypertension: Secondary | ICD-10-CM

## 2020-06-08 ENCOUNTER — Encounter (INDEPENDENT_AMBULATORY_CARE_PROVIDER_SITE_OTHER): Payer: Self-pay | Admitting: Bariatrics

## 2020-06-08 NOTE — Progress Notes (Signed)
Chief Complaint:   OBESITY Alicia Lowery is here to discuss her progress with her obesity treatment plan along with follow-up of her obesity related diagnoses. Alicia Lowery is on the Category 2 Plan and states she is following her eating plan approximately 85% of the time. Alicia Lowery states she is walking for 20 minutes 3 times per week.  Today's visit was #: 18 Starting weight: 246 lbs Starting date: 06/24/2019 Today's weight: 238 lbs Today's date: 06/07/2020 Total lbs lost to date: 8 lbs Total lbs lost since last in-office visit: 0  Interim History: Alicia Lowery is up 6 pounds since her last visit, but is doing well overall.  She is up 1.5 pounds of water weight.    Subjective:   1. Type 2 diabetes mellitus with other specified complication, without long-term current use of insulin (HCC) Alicia Lowery is taking Ozempic and metformin.  Lab Results  Component Value Date   HGBA1C 6.4 (H) 03/15/2020   HGBA1C 6.4 (H) 10/15/2019   HGBA1C 7.1 (A) 06/23/2019   Lab Results  Component Value Date   MICROALBUR <5.0 06/23/2019   LDLCALC 96 03/15/2020   CREATININE 0.92 03/15/2020   Lab Results  Component Value Date   INSULIN 14.2 03/15/2020   INSULIN 17.6 06/24/2019   2. Essential hypertension Controlled.  Review: taking medications as instructed, no medication side effects noted, no chest pain on exertion, no dyspnea on exertion, no swelling of ankles.    BP Readings from Last 3 Encounters:  06/07/20 125/80  05/23/20 125/82  05/02/20 120/72   Assessment/Plan:   1. Type 2 diabetes mellitus with other specified complication, without long-term current use of insulin (HCC) Good blood sugar control is important to decrease the likelihood of diabetic complications such as nephropathy, neuropathy, limb loss, blindness, coronary artery disease, and death. Intensive lifestyle modification including diet, exercise and weight loss are the first line of treatment for diabetes.  Continue Ozempic and metformin.   Decrease carbohydrates and stay more adherent to the plan.  2. Essential hypertension Alicia Lowery is working on healthy weight loss and exercise to improve blood pressure control. We will watch for signs of hypotension as she continues her lifestyle modifications.  Continue medications.   3. Class 3 severe obesity with serious comorbidity and body mass index (BMI) of 40.0 to 44.9 in adult, unspecified obesity type [facility])  Alicia Lowery is currently in the action stage of change. As such, her goal is to continue with weight loss efforts. She has agreed to the Category 2 Plan.   She will work on meal planning, will remain adherent to the plan, and will use her scale for weighing protein.  Exercise goals: Increasing activity and increasing walking.  Behavioral modification strategies: increasing lean protein intake, decreasing simple carbohydrates, increasing vegetables, increasing water intake, decreasing eating out, no skipping meals, meal planning and cooking strategies, keeping healthy foods in the home and planning for success.  Alicia Lowery has agreed to follow-up with our clinic in 2-3 weeks. She was informed of the importance of frequent follow-up visits to maximize her success with intensive lifestyle modifications for her multiple health conditions.   Objective:   Blood pressure 125/80, pulse 75, temperature 98.3 F (36.8 C), height 5\' 2"  (1.575 m), weight 238 lb (108 kg), SpO2 (!) 5 %. Body mass index is 43.53 kg/m.  General: Cooperative, alert, well developed, in no acute distress. HEENT: Conjunctivae and lids unremarkable. Cardiovascular: Regular rhythm.  Lungs: Normal work of breathing. Neurologic: No focal deficits.   Lab Results  Component Value Date   CREATININE 0.92 03/15/2020   BUN 15 03/15/2020   NA 140 03/15/2020   K 4.0 03/15/2020   CL 101 03/15/2020   CO2 28 03/15/2020   Lab Results  Component Value Date   ALT 25 03/15/2020   AST 20 03/15/2020   ALKPHOS 67 03/15/2020    BILITOT 0.8 03/15/2020   Lab Results  Component Value Date   HGBA1C 6.4 (H) 03/15/2020   HGBA1C 6.4 (H) 10/15/2019   HGBA1C 7.1 (A) 06/23/2019   HGBA1C 7.1 (A) 02/12/2019   HGBA1C 7.3 (A) 10/13/2018   Lab Results  Component Value Date   INSULIN 14.2 03/15/2020   INSULIN 17.6 06/24/2019   Lab Results  Component Value Date   TSH 1.280 06/24/2019   Lab Results  Component Value Date   CHOL 161 03/15/2020   HDL 48 03/15/2020   LDLCALC 96 03/15/2020   TRIG 90 03/15/2020   CHOLHDL 5.4 (H) 06/23/2019   Lab Results  Component Value Date   WBC 3.7 06/23/2019   HGB 13.1 06/23/2019   HCT 39.4 06/23/2019   MCV 91 06/23/2019   PLT 233 06/23/2019   Attestation Statements:   Reviewed by clinician on day of visit: allergies, medications, problem list, medical history, surgical history, family history, social history, and previous encounter notes.  Time spent on visit including pre-visit chart review and post-visit care and charting was 20 minutes.   I, Insurance claims handler, CMA, am acting as Energy manager for [employer], DO  I have reviewed the above documentation for accuracy and completeness, and I agree with the above. Corinna Capra, DO

## 2020-06-14 ENCOUNTER — Ambulatory Visit: Payer: BC Managed Care – PPO | Admitting: Family Medicine

## 2020-06-21 ENCOUNTER — Other Ambulatory Visit: Payer: Self-pay

## 2020-06-21 ENCOUNTER — Ambulatory Visit (INDEPENDENT_AMBULATORY_CARE_PROVIDER_SITE_OTHER): Payer: PRIVATE HEALTH INSURANCE | Admitting: Bariatrics

## 2020-06-21 ENCOUNTER — Encounter (INDEPENDENT_AMBULATORY_CARE_PROVIDER_SITE_OTHER): Payer: Self-pay | Admitting: Bariatrics

## 2020-06-21 VITALS — BP 134/88 | HR 84 | Temp 98.1°F | Ht 62.0 in | Wt 241.0 lb

## 2020-06-21 DIAGNOSIS — E559 Vitamin D deficiency, unspecified: Secondary | ICD-10-CM

## 2020-06-21 DIAGNOSIS — Z6841 Body Mass Index (BMI) 40.0 and over, adult: Secondary | ICD-10-CM

## 2020-06-21 DIAGNOSIS — E669 Obesity, unspecified: Secondary | ICD-10-CM | POA: Diagnosis not present

## 2020-06-21 DIAGNOSIS — Z9189 Other specified personal risk factors, not elsewhere classified: Secondary | ICD-10-CM

## 2020-06-21 DIAGNOSIS — E1169 Type 2 diabetes mellitus with other specified complication: Secondary | ICD-10-CM

## 2020-06-21 MED ORDER — VITAMIN D (ERGOCALCIFEROL) 1.25 MG (50000 UNIT) PO CAPS: ORAL_CAPSULE | ORAL | 0 refills | Status: DC

## 2020-06-23 NOTE — Progress Notes (Signed)
Chief Complaint:   OBESITY Alicia Lowery is here to discuss her progress with her obesity treatment plan along with follow-up of her obesity related diagnoses. Alicia Lowery is on the Category 2 Plan and states she is following her eating plan approximately 75% of the time. Alicia Lowery states she is walking for 20+ minutes 3 times per week.  Today's visit was #: 19 Starting weight: 246 lbs Starting date: 06/24/2019 Today's weight: 241 lbs Today's date: 06/21/2020 Total lbs lost to date: 5 lbs Total lbs lost since last in-office visit: 0  Interim History: Alicia Lowery is up another 3 pounds since her last office visit. She is eating more vegetables and trying to get protein.  Subjective:   1. Vitamin D deficiency Alicia Lowery's Vitamin D level was 55.5 on 03/15/2020. She is currently taking prescription vitamin D 50,000 IU each week. She denies nausea, vomiting or muscle weakness.  2. Type 2 diabetes mellitus with obesity (HCC) Taking metformin 500 mg twice daily and Ozempic 1 mg subcutaneously weekly.  Lab Results  Component Value Date   HGBA1C 6.4 (H) 03/15/2020   HGBA1C 6.4 (H) 10/15/2019   HGBA1C 7.1 (A) 06/23/2019   Lab Results  Component Value Date   MICROALBUR <5.0 06/23/2019   LDLCALC 96 03/15/2020   CREATININE 0.92 03/15/2020   Lab Results  Component Value Date   INSULIN 14.2 03/15/2020   INSULIN 17.6 06/24/2019   3. At risk for osteoporosis Alicia Lowery is at higher risk of osteopenia and osteoporosis due to Vitamin D deficiency.   Assessment/Plan:   1. Vitamin D deficiency Low Vitamin D level contributes to fatigue and are associated with obesity, breast, and colon cancer. She agrees to continue to take prescription Vitamin D @50 ,000 IU every week and will follow-up for routine testing of Vitamin D, at least 2-3 times per year to avoid over-replacement.  - Refill Vitamin D, Ergocalciferol, (DRISDOL) 1.25 MG (50000 UNIT) CAPS capsule; Take once every 2 weeks.  Dispense: 2 capsule; Refill:  0  2. Type 2 diabetes mellitus with obesity (HCC) Good blood sugar control is important to decrease the likelihood of diabetic complications such as nephropathy, neuropathy, limb loss, blindness, coronary artery disease, and death. Intensive lifestyle modification including diet, exercise and weight loss are the first line of treatment for diabetes.  Continue medications.  3. At risk for osteoporosis Alicia Lowery was given approximately 15 minutes of osteoporosis prevention counseling today. Alicia Lowery is at risk for osteopenia and osteoporosis due to her Vitamin D deficiency. She was encouraged to take her Vitamin D and follow her higher calcium diet and increase strengthening exercise to help strengthen her bones and decrease her risk of osteopenia and osteoporosis.  Repetitive spaced learning was employed today to elicit superior memory formation and behavioral change.  4. Class 3 severe obesity with serious comorbidity and body mass index (BMI) of 40.0 to 44.9 in adult, unspecified obesity type [facility])  Alicia Lowery is currently in the action stage of change. As such, her goal is to continue with weight loss efforts. She has agreed to the Category 2 Plan.   She will work on meal planning, intentional eating, and increasing her protein intake.  Recipes II and Protein Equivalents sheets were provided today.  Exercise goals: For substantial health benefits, adults should do at least 150 minutes (2 hours and 30 minutes) a week of moderate-intensity, or 75 minutes (1 hour and 15 minutes) a week of vigorous-intensity aerobic physical activity, or an equivalent combination of moderate- and vigorous-intensity aerobic activity. Aerobic  activity should be performed in episodes of at least 10 minutes, and preferably, it should be spread throughout the week.  Behavioral modification strategies: increasing lean protein intake, decreasing simple carbohydrates, increasing vegetables, increasing water intake, decreasing eating  out, no skipping meals, meal planning and cooking strategies, keeping healthy foods in the home and planning for success.  Alicia Lowery has agreed to follow-up with our clinic in 5-6 weeks. She was informed of the importance of frequent follow-up visits to maximize her success with intensive lifestyle modifications for her multiple health conditions.   Objective:   Blood pressure 134/88, pulse 84, temperature 98.1 F (36.7 C), height 5\' 2"  (1.575 m), weight 241 lb (109.3 kg), SpO2 96 %. Body mass index is 44.08 kg/m.  General: Cooperative, alert, well developed, in no acute distress. HEENT: Conjunctivae and lids unremarkable. Cardiovascular: Regular rhythm.  Lungs: Normal work of breathing. Neurologic: No focal deficits.   Lab Results  Component Value Date   CREATININE 0.92 03/15/2020   BUN 15 03/15/2020   NA 140 03/15/2020   K 4.0 03/15/2020   CL 101 03/15/2020   CO2 28 03/15/2020   Lab Results  Component Value Date   ALT 25 03/15/2020   AST 20 03/15/2020   ALKPHOS 67 03/15/2020   BILITOT 0.8 03/15/2020   Lab Results  Component Value Date   HGBA1C 6.4 (H) 03/15/2020   HGBA1C 6.4 (H) 10/15/2019   HGBA1C 7.1 (A) 06/23/2019   HGBA1C 7.1 (A) 02/12/2019   HGBA1C 7.3 (A) 10/13/2018   Lab Results  Component Value Date   INSULIN 14.2 03/15/2020   INSULIN 17.6 06/24/2019   Lab Results  Component Value Date   TSH 1.280 06/24/2019   Lab Results  Component Value Date   CHOL 161 03/15/2020   HDL 48 03/15/2020   LDLCALC 96 03/15/2020   TRIG 90 03/15/2020   CHOLHDL 5.4 (H) 06/23/2019   Lab Results  Component Value Date   WBC 3.7 06/23/2019   HGB 13.1 06/23/2019   HCT 39.4 06/23/2019   MCV 91 06/23/2019   PLT 233 06/23/2019   Attestation Statements:   Reviewed by clinician on day of visit: allergies, medications, problem list, medical history, surgical history, family history, social history, and previous encounter notes.  I, 08/21/2019, CMA, am acting as  Insurance claims handler for Energy manager, DO  I have reviewed the above documentation for accuracy and completeness, and I agree with the above. [employer], DO

## 2020-06-25 ENCOUNTER — Encounter (INDEPENDENT_AMBULATORY_CARE_PROVIDER_SITE_OTHER): Payer: Self-pay | Admitting: Bariatrics

## 2020-07-26 ENCOUNTER — Ambulatory Visit (INDEPENDENT_AMBULATORY_CARE_PROVIDER_SITE_OTHER): Payer: PRIVATE HEALTH INSURANCE | Admitting: Bariatrics

## 2020-07-26 ENCOUNTER — Encounter (INDEPENDENT_AMBULATORY_CARE_PROVIDER_SITE_OTHER): Payer: Self-pay | Admitting: Bariatrics

## 2020-07-26 ENCOUNTER — Other Ambulatory Visit: Payer: Self-pay

## 2020-07-26 VITALS — BP 123/79 | HR 77 | Temp 98.2°F | Ht 62.0 in | Wt 237.0 lb

## 2020-07-26 DIAGNOSIS — Z6841 Body Mass Index (BMI) 40.0 and over, adult: Secondary | ICD-10-CM | POA: Diagnosis not present

## 2020-07-26 DIAGNOSIS — E559 Vitamin D deficiency, unspecified: Secondary | ICD-10-CM

## 2020-07-26 DIAGNOSIS — I10 Essential (primary) hypertension: Secondary | ICD-10-CM

## 2020-07-26 DIAGNOSIS — Z9189 Other specified personal risk factors, not elsewhere classified: Secondary | ICD-10-CM

## 2020-07-26 MED ORDER — VITAMIN D (ERGOCALCIFEROL) 1.25 MG (50000 UNIT) PO CAPS: ORAL_CAPSULE | ORAL | 0 refills | Status: DC

## 2020-07-27 ENCOUNTER — Ambulatory Visit (INDEPENDENT_AMBULATORY_CARE_PROVIDER_SITE_OTHER): Payer: PRIVATE HEALTH INSURANCE | Admitting: Bariatrics

## 2020-07-31 NOTE — Progress Notes (Signed)
  Subjective:    Patient ID: Alicia Lowery, female    DOB: 05-12-1961, 60 y.o.   MRN: [id number]  Alicia Lowery is a 60 y.o. female who presents for follow-up of Type 2 diabetes mellitus.  Home blood sugar records: meter record, fasting and post meal, 80- 130 Current symptoms/problems include none at this time. Daily foot checks: yes   Any foot concerns: great toe on both feet in pain Exercise: walking for 2-30 min QOD Diet: good She continues to go to medical weight loss and wellness and has lost roughly 9 pounds since she started the program.  States that she has made major changes in her lifestyle to get to that point.  She continues on Metformin and Ozempic as well as Crestor and lisinopril/HCTZ.  She is also taking vitamin D supplementation. The following portions of the patient's history were reviewed and updated as appropriate: allergies, current medications, past medical history, past social history and problem list.  ROS as in subjective above.     Objective:    Physical Exam Alert and in no distress otherwise not examined. Hemoglobin A1c is 6.9   Lab Review Diabetic Labs Latest Ref Rng & Units 03/15/2020 10/15/2019 06/23/2019 02/12/2019 10/13/2018  HbA1c 4.8 - 5.6 % 6.4(H) 6.4(H) 7.1(A) 7.1(A) 7.3(A)  Microalbumin mg/L - - <5.0 - -  Micro/Creat Ratio - - - <6.0 - -  Chol 100 - 199 mg/dL 564 - 332(R) - -  HDL >51 mg/dL 48 - 46 - -  Calc LDL 0 - 99 mg/dL 96 - 884(Z) - -  Triglycerides 0 - 149 mg/dL 90 - 660 - -  Creatinine 0.57 - 1.00 mg/dL 6.30 - 1.60 - -   BP/Weight 07/26/2020 06/21/2020 06/07/2020 05/23/2020 05/02/2020  Systolic BP 123 134 125 125 120  Diastolic BP 79 88 80 82 72  Wt. (Lbs) 237 241 238 232 231  BMI 43.35 44.08 43.53 42.43 42.25   Foot/eye exam completion dates Latest Ref Rng & Units 12/10/2019 01/15/2019  Eye Exam No Retinopathy - No Retinopathy  Foot Form Completion - Done -    Alicia Lowery  reports that she has never smoked. She has never used smokeless tobacco. She  reports current alcohol use. She reports that she does not use drugs.     Assessment & Plan:    Type 2 diabetes mellitus with obesity (HCC)  Vitamin D deficiency  Class 3 severe obesity due to excess calories with serious comorbidity and body mass index (BMI) of 40.0 to 44.9 in adult [facility])  Hyperlipidemia associated with type 2 diabetes mellitus (HCC)  Essential hypertension  Hypertension associated with type 2 diabetes mellitus (HCC)    1. Rx changes: none 2. Education: Reviewed 'ABCs' of diabetes management (respective goals in parentheses):  A1C (<7), blood pressure (<130/80), and cholesterol (LDL <100). 3. Compliance at present is estimated to be fair. Efforts to improve compliance (if necessary) will be directed at increased exercise.  Continue to cut back on carbohydrates and continue with medical weight loss. 4. Follow up: 6 months set up for complete exam in 6 months.

## 2020-08-01 ENCOUNTER — Ambulatory Visit: Payer: 59 | Admitting: Family Medicine

## 2020-08-01 ENCOUNTER — Other Ambulatory Visit: Payer: Self-pay

## 2020-08-01 ENCOUNTER — Encounter: Payer: Self-pay | Admitting: Family Medicine

## 2020-08-01 VITALS — BP 154/82 | HR 72 | Temp 97.3°F | Wt 243.0 lb

## 2020-08-01 DIAGNOSIS — E669 Obesity, unspecified: Secondary | ICD-10-CM | POA: Diagnosis not present

## 2020-08-01 DIAGNOSIS — I10 Essential (primary) hypertension: Secondary | ICD-10-CM

## 2020-08-01 DIAGNOSIS — Z6841 Body Mass Index (BMI) 40.0 and over, adult: Secondary | ICD-10-CM

## 2020-08-01 DIAGNOSIS — E1169 Type 2 diabetes mellitus with other specified complication: Secondary | ICD-10-CM

## 2020-08-01 DIAGNOSIS — E559 Vitamin D deficiency, unspecified: Secondary | ICD-10-CM

## 2020-08-01 DIAGNOSIS — E785 Hyperlipidemia, unspecified: Secondary | ICD-10-CM

## 2020-08-01 DIAGNOSIS — I152 Hypertension secondary to endocrine disorders: Secondary | ICD-10-CM

## 2020-08-01 DIAGNOSIS — E1159 Type 2 diabetes mellitus with other circulatory complications: Secondary | ICD-10-CM

## 2020-08-01 LAB — POCT GLYCOSYLATED HEMOGLOBIN (HGB A1C): Hemoglobin A1C: 6.9 % — AB (ref 4.0–5.6)

## 2020-08-01 NOTE — Progress Notes (Signed)
Chief Complaint:   OBESITY Alicia Lowery is here to discuss her progress with her obesity treatment plan along with follow-up of her obesity related diagnoses. Alicia Lowery is on the Category 2 Plan and states she is following her eating plan approximately 60% of the time. Alicia Lowery states she is walking for 30 minutes 3 times per week.  Today's visit was #: 20 Starting weight: 246 lbs Starting date: 06/24/2019 Today's weight: 237 lbs Today's date: 07/26/2020 Total lbs lost to date: 9 Total lbs lost since last in-office visit: 4  Interim History: Alicia Lowery is down 4 lbs since her last visit. She has been out of town as well, but she managed to so well. She notes she was more active.  Subjective:   1. Vitamin D deficiency Alicia Lowery is on Vit D prescription.  2. Essential hypertension Alicia Lowery's blood pressure is controlled.  3. At risk for hypoglycemia Alicia Lowery is at increased risk for hypoglycemia due to changes in diet, and diabetes mellitus II.  Assessment/Plan:   1. Vitamin D deficiency Low Vitamin D level contributes to fatigue and are associated with obesity, breast, and colon cancer. We will refill prescription Vitamin D for 1 month. Alicia Lowery will follow-up for routine testing of Vitamin D, at least 2-3 times per year to avoid over-replacement.  - Vitamin D, Ergocalciferol, (DRISDOL) 1.25 MG (50000 UNIT) CAPS capsule; Take once every 2 weeks.  Dispense: 2 capsule; Refill: 0  2. Essential hypertension Alicia Lowery will continue her medications, and will continue working on healthy weight loss and exercise to improve blood pressure control. We will watch for signs of hypotension as she continues her lifestyle modifications.  3. At risk for hypoglycemia Alicia Lowery was given approximately 15 minutes of counseling today regarding prevention of hypoglycemia. She was advised of symptoms of hypoglycemia. Alicia Lowery was instructed to avoid skipping meals, eat regular protein rich meals and schedule low calorie snacks  as needed.   Repetitive spaced learning was employed today to elicit superior memory formation and behavioral change  4. Class 3 severe obesity due to excess calories with serious comorbidity and body mass index (BMI) of 40.0 to 44.9 in adult [facility]) Alicia Lowery is currently in the action stage of change. As such, her goal is to continue with weight loss efforts. She has agreed to the Category 2 Plan.   Alicia Lowery will adhere more closely to the meal plan.  Exercise goals: As is.  Behavioral modification strategies: increasing lean protein intake, decreasing simple carbohydrates, increasing vegetables, increasing water intake, decreasing eating out, no skipping meals, meal planning and cooking strategies, keeping healthy foods in the home and planning for success.  Alicia Lowery has agreed to follow-up with our clinic in 2 to 3 weeks. She was informed of the importance of frequent follow-up visits to maximize her success with intensive lifestyle modifications for her multiple health conditions.   Objective:   Blood pressure 123/79, pulse 77, temperature 98.2 F (36.8 C), height 5\' 2"  (1.575 m), weight 237 lb (107.5 kg), SpO2 95 %. Body mass index is 43.35 kg/m.  General: Cooperative, alert, well developed, in no acute distress. HEENT: Conjunctivae and lids unremarkable. Cardiovascular: Regular rhythm.  Lungs: Normal work of breathing. Neurologic: No focal deficits.   Lab Results  Component Value Date   CREATININE 0.92 03/15/2020   BUN 15 03/15/2020   NA 140 03/15/2020   K 4.0 03/15/2020   CL 101 03/15/2020   CO2 28 03/15/2020   Lab Results  Component Value Date   ALT 25 03/15/2020  AST 20 03/15/2020   ALKPHOS 67 03/15/2020   BILITOT 0.8 03/15/2020   Lab Results  Component Value Date   HGBA1C 6.4 (H) 03/15/2020   HGBA1C 6.4 (H) 10/15/2019   HGBA1C 7.1 (A) 06/23/2019   HGBA1C 7.1 (A) 02/12/2019   HGBA1C 7.3 (A) 10/13/2018   Lab Results  Component Value Date   INSULIN 14.2  03/15/2020   INSULIN 17.6 06/24/2019   Lab Results  Component Value Date   TSH 1.280 06/24/2019   Lab Results  Component Value Date   CHOL 161 03/15/2020   HDL 48 03/15/2020   LDLCALC 96 03/15/2020   TRIG 90 03/15/2020   CHOLHDL 5.4 (H) 06/23/2019   Lab Results  Component Value Date   WBC 3.7 06/23/2019   HGB 13.1 06/23/2019   HCT 39.4 06/23/2019   MCV 91 06/23/2019   PLT 233 06/23/2019   No results found for: IRON, TIBC, FERRITIN  Attestation Statements:   Reviewed by clinician on day of visit: allergies, medications, problem list, medical history, surgical history, family history, social history, and previous encounter notes.   Trude Mcburney, am acting as Energy manager for [employer], DO.  I have reviewed the above documentation for accuracy and completeness, and I agree with the above. Corinna Capra, DO

## 2020-08-02 ENCOUNTER — Encounter (INDEPENDENT_AMBULATORY_CARE_PROVIDER_SITE_OTHER): Payer: Self-pay | Admitting: Bariatrics

## 2020-08-11 ENCOUNTER — Other Ambulatory Visit: Payer: Self-pay

## 2020-08-11 ENCOUNTER — Encounter (INDEPENDENT_AMBULATORY_CARE_PROVIDER_SITE_OTHER): Payer: Self-pay | Admitting: Bariatrics

## 2020-08-11 ENCOUNTER — Ambulatory Visit (INDEPENDENT_AMBULATORY_CARE_PROVIDER_SITE_OTHER): Payer: PRIVATE HEALTH INSURANCE | Admitting: Bariatrics

## 2020-08-11 VITALS — BP 132/82 | HR 81 | Temp 98.3°F | Ht 62.0 in | Wt 242.0 lb

## 2020-08-11 DIAGNOSIS — E1159 Type 2 diabetes mellitus with other circulatory complications: Secondary | ICD-10-CM

## 2020-08-11 DIAGNOSIS — I152 Hypertension secondary to endocrine disorders: Secondary | ICD-10-CM | POA: Diagnosis not present

## 2020-08-11 DIAGNOSIS — E559 Vitamin D deficiency, unspecified: Secondary | ICD-10-CM

## 2020-08-11 DIAGNOSIS — Z6841 Body Mass Index (BMI) 40.0 and over, adult: Secondary | ICD-10-CM

## 2020-08-11 MED ORDER — VITAMIN D (ERGOCALCIFEROL) 1.25 MG (50000 UNIT) PO CAPS: ORAL_CAPSULE | ORAL | 0 refills | Status: DC

## 2020-08-16 ENCOUNTER — Encounter (INDEPENDENT_AMBULATORY_CARE_PROVIDER_SITE_OTHER): Payer: Self-pay | Admitting: Bariatrics

## 2020-08-16 ENCOUNTER — Other Ambulatory Visit: Payer: Self-pay | Admitting: Family Medicine

## 2020-08-16 DIAGNOSIS — I152 Hypertension secondary to endocrine disorders: Secondary | ICD-10-CM

## 2020-08-16 DIAGNOSIS — E1159 Type 2 diabetes mellitus with other circulatory complications: Secondary | ICD-10-CM

## 2020-08-16 NOTE — Progress Notes (Signed)
Chief Complaint:   OBESITY Alicia Lowery is here to discuss her progress with her obesity treatment plan along with follow-up of her obesity related diagnoses. Alicia Lowery is on the Category 2 Plan and states she is following her eating plan approximately 80% of the time. Alicia Lowery states she is doing 0 minutes 0 times per week.  Today's visit was #: 21 Starting weight: 246 lbs Starting date: 06/24/2019 Today's weight: 242 lbs Today's date: 08/11/2020 Total lbs lost to date: 4 Total lbs lost since last in-office visit: 0  Interim History: Alicia Lowery is up 5 lbs since her last visit. She is eating more foods with salt. She is doing ok with protein.  Subjective:   1. Hypertension associated with type 2 diabetes mellitus (HCC) Alicia Lowery's blood pressure is controlled.  2. Vitamin D deficiency Alicia Lowery notes minimal sun exposure.  Assessment/Plan:   1. Hypertension associated with type 2 diabetes mellitus (HCC) Alicia Lowery will continue her medications, and will continue working on healthy weight loss and exercise to improve blood pressure control. We will watch for signs of hypotension as she continues her lifestyle modifications.  2. Vitamin D deficiency Low Vitamin D level contributes to fatigue and are associated with obesity, breast, and colon cancer. We will refill prescription Vitamin D for 1 month. Alicia Lowery will follow-up for routine testing of Vitamin D, at least 2-3 times per year to avoid over-replacement.  - Vitamin D, Ergocalciferol, (DRISDOL) 1.25 MG (50000 UNIT) CAPS capsule; Take once every 2 weeks.  Dispense: 2 capsule; Refill: 0  3. Obesity current BMI 44 Alicia Lowery is currently in the action stage of change. As such, her goal is to continue with weight loss efforts. She has agreed to the Category 2 Plan.   Alicia Lowery will adhere closely to the plan.  Exercise goals: Increase walking.  Behavioral modification strategies: increasing lean protein intake, decreasing simple carbohydrates, increasing  vegetables, increasing water intake, decreasing eating out, no skipping meals, meal planning and cooking strategies, keeping healthy foods in the home and planning for success.  Alicia Lowery has agreed to follow-up with our clinic in 2 to 3 weeks. She was informed of the importance of frequent follow-up visits to maximize her success with intensive lifestyle modifications for her multiple health conditions.   Objective:   Blood pressure 132/82, pulse 81, temperature 98.3 F (36.8 C), height 5\' 2"  (1.575 m), weight 242 lb (109.8 kg), SpO2 95 %. Body mass index is 44.26 kg/m.  General: Cooperative, alert, well developed, in no acute distress. HEENT: Conjunctivae and lids unremarkable. Cardiovascular: Regular rhythm.  Lungs: Normal work of breathing. Neurologic: No focal deficits.   Lab Results  Component Value Date   CREATININE 0.92 03/15/2020   BUN 15 03/15/2020   NA 140 03/15/2020   K 4.0 03/15/2020   CL 101 03/15/2020   CO2 28 03/15/2020   Lab Results  Component Value Date   ALT 25 03/15/2020   AST 20 03/15/2020   ALKPHOS 67 03/15/2020   BILITOT 0.8 03/15/2020   Lab Results  Component Value Date   HGBA1C 6.9 (A) 08/01/2020   HGBA1C 6.4 (H) 03/15/2020   HGBA1C 6.4 (H) 10/15/2019   HGBA1C 7.1 (A) 06/23/2019   HGBA1C 7.1 (A) 02/12/2019   Lab Results  Component Value Date   INSULIN 14.2 03/15/2020   INSULIN 17.6 06/24/2019   Lab Results  Component Value Date   TSH 1.280 06/24/2019   Lab Results  Component Value Date   CHOL 161 03/15/2020   HDL 48  03/15/2020   LDLCALC 96 03/15/2020   TRIG 90 03/15/2020   CHOLHDL 5.4 (H) 06/23/2019   Lab Results  Component Value Date   WBC 3.7 06/23/2019   HGB 13.1 06/23/2019   HCT 39.4 06/23/2019   MCV 91 06/23/2019   PLT 233 06/23/2019   No results found for: IRON, TIBC, FERRITIN  Attestation Statements:   Reviewed by clinician on day of visit: allergies, medications, problem list, medical history, surgical history, family  history, social history, and previous encounter notes.   Trude Mcburney, am acting as Energy manager for [employer], DO.  I have reviewed the above documentation for accuracy and completeness, and I agree with the above. Corinna Capra, DO

## 2020-08-17 ENCOUNTER — Other Ambulatory Visit (INDEPENDENT_AMBULATORY_CARE_PROVIDER_SITE_OTHER): Payer: Self-pay | Admitting: Bariatrics

## 2020-08-17 DIAGNOSIS — E559 Vitamin D deficiency, unspecified: Secondary | ICD-10-CM

## 2020-08-29 ENCOUNTER — Encounter (INDEPENDENT_AMBULATORY_CARE_PROVIDER_SITE_OTHER): Payer: Self-pay | Admitting: Bariatrics

## 2020-08-29 ENCOUNTER — Other Ambulatory Visit: Payer: Self-pay

## 2020-08-29 ENCOUNTER — Ambulatory Visit (INDEPENDENT_AMBULATORY_CARE_PROVIDER_SITE_OTHER): Payer: 59 | Admitting: Bariatrics

## 2020-08-29 VITALS — BP 115/72 | HR 78 | Temp 98.2°F | Ht 62.0 in | Wt 242.0 lb

## 2020-08-29 DIAGNOSIS — E559 Vitamin D deficiency, unspecified: Secondary | ICD-10-CM | POA: Diagnosis not present

## 2020-08-29 DIAGNOSIS — I10 Essential (primary) hypertension: Secondary | ICD-10-CM

## 2020-08-29 DIAGNOSIS — Z6841 Body Mass Index (BMI) 40.0 and over, adult: Secondary | ICD-10-CM

## 2020-08-29 MED ORDER — VITAMIN D (ERGOCALCIFEROL) 1.25 MG (50000 UNIT) PO CAPS: ORAL_CAPSULE | ORAL | 0 refills | Status: DC

## 2020-08-30 ENCOUNTER — Encounter (INDEPENDENT_AMBULATORY_CARE_PROVIDER_SITE_OTHER): Payer: Self-pay | Admitting: Bariatrics

## 2020-08-30 NOTE — Progress Notes (Signed)
Chief Complaint:   OBESITY Alicia Lowery is here to discuss her progress with her obesity treatment plan along with follow-up of her obesity related diagnoses. Alicia Lowery is on the Category 2 Plan and states she is following her eating plan approximately 85% of the time. Alicia Lowery states she is walking for 25 minutes 3 times per week.  Today's visit was #: 22 Starting weight: 246 lbs Starting date: 06/24/2019 Today's weight: 242 lbs Today's date: 08/29/2020 Total lbs lost to date: 4 lbs Total lbs lost since last in-office visit: 0  Interim History: Exie's weight remains the same.  She is drinking more water.  She is trying not to skip meals.  Subjective:   1. Essential hypertension Controlled.  Review: taking medications as instructed, no medication side effects noted, no chest pain on exertion, no dyspnea on exertion, no swelling of ankles.  Taking medications as directed.  BP Readings from Last 3 Encounters:  08/29/20 115/72  08/11/20 132/82  08/01/20 (!) 154/82   2. Vitamin D deficiency Alicia Lowery's Vitamin D level was 55.5 on 03/15/2020. She is currently taking prescription vitamin D 50,000 IU each week. She denies nausea, vomiting or muscle weakness.  She gets minimal sun exposure.   Assessment/Plan:   1. Essential hypertension Alicia Lowery is working on healthy weight loss and exercise to improve blood pressure control. We will watch for signs of hypotension as she continues her lifestyle modifications.  Continue medications.   2. Vitamin D deficiency Low Vitamin D level contributes to fatigue and are associated with obesity, breast, and colon cancer. She agrees to continue to take prescription Vitamin D @50 ,000 IU every week and will follow-up for routine testing of Vitamin D, at least 2-3 times per year to avoid over-replacement.  - Refill Vitamin D, Ergocalciferol, (DRISDOL) 1.25 MG (50000 UNIT) CAPS capsule; Take once every 2 weeks.  Dispense: 2 capsule; Refill: 0  3. Obesity, current  BMI 44  Alicia Lowery is currently in the action stage of change. As such, her goal is to continue with weight loss efforts. She has agreed to the Category 2 Plan.   She will work on meal planning and mindful eating.  Exercise goals: 3 pound weights/3 resistance bands.  Behavioral modification strategies: increasing lean protein intake, decreasing simple carbohydrates, increasing vegetables, increasing water intake, decreasing eating out, no skipping meals, meal planning and cooking strategies, keeping healthy foods in the home and planning for success.  Alicia Lowery has agreed to follow-up with our clinic in 3-4 weeks. She was informed of the importance of frequent follow-up visits to maximize her success with intensive lifestyle modifications for her multiple health conditions.   Objective:   Blood pressure 115/72, pulse 78, temperature 98.2 F (36.8 C), height 5\' 2"  (1.575 m), weight 242 lb (109.8 kg), SpO2 94 %. Body mass index is 44.26 kg/m.  General: Cooperative, alert, well developed, in no acute distress. HEENT: Conjunctivae and lids unremarkable. Cardiovascular: Regular rhythm.  Lungs: Normal work of breathing. Neurologic: No focal deficits.   Lab Results  Component Value Date   CREATININE 0.92 03/15/2020   BUN 15 03/15/2020   NA 140 03/15/2020   K 4.0 03/15/2020   CL 101 03/15/2020   CO2 28 03/15/2020   Lab Results  Component Value Date   ALT 25 03/15/2020   AST 20 03/15/2020   ALKPHOS 67 03/15/2020   BILITOT 0.8 03/15/2020   Lab Results  Component Value Date   HGBA1C 6.9 (A) 08/01/2020   HGBA1C 6.4 (H) 03/15/2020  HGBA1C 6.4 (H) 10/15/2019   HGBA1C 7.1 (A) 06/23/2019   HGBA1C 7.1 (A) 02/12/2019   Lab Results  Component Value Date   INSULIN 14.2 03/15/2020   INSULIN 17.6 06/24/2019   Lab Results  Component Value Date   TSH 1.280 06/24/2019   Lab Results  Component Value Date   CHOL 161 03/15/2020   HDL 48 03/15/2020   LDLCALC 96 03/15/2020   TRIG 90  03/15/2020   CHOLHDL 5.4 (H) 06/23/2019   Lab Results  Component Value Date   WBC 3.7 06/23/2019   HGB 13.1 06/23/2019   HCT 39.4 06/23/2019   MCV 91 06/23/2019   PLT 233 06/23/2019   Attestation Statements:   Reviewed by clinician on day of visit: allergies, medications, problem list, medical history, surgical history, family history, social history, and previous encounter notes.  I, Insurance claims handler, CMA, am acting as Energy manager for [employer], DO  I have reviewed the above documentation for accuracy and completeness, and I agree with the above. Corinna Capra, DO

## 2020-09-19 ENCOUNTER — Ambulatory Visit (INDEPENDENT_AMBULATORY_CARE_PROVIDER_SITE_OTHER): Payer: 59 | Admitting: Bariatrics

## 2020-09-19 ENCOUNTER — Other Ambulatory Visit: Payer: Self-pay

## 2020-09-19 ENCOUNTER — Encounter (INDEPENDENT_AMBULATORY_CARE_PROVIDER_SITE_OTHER): Payer: Self-pay | Admitting: Bariatrics

## 2020-09-19 VITALS — BP 111/64 | HR 74 | Temp 98.2°F | Ht 62.0 in | Wt 240.0 lb

## 2020-09-19 DIAGNOSIS — E559 Vitamin D deficiency, unspecified: Secondary | ICD-10-CM

## 2020-09-19 DIAGNOSIS — E7849 Other hyperlipidemia: Secondary | ICD-10-CM

## 2020-09-19 DIAGNOSIS — Z9189 Other specified personal risk factors, not elsewhere classified: Secondary | ICD-10-CM

## 2020-09-19 DIAGNOSIS — Z6841 Body Mass Index (BMI) 40.0 and over, adult: Secondary | ICD-10-CM | POA: Diagnosis not present

## 2020-09-19 MED ORDER — VITAMIN D (ERGOCALCIFEROL) 1.25 MG (50000 UNIT) PO CAPS: ORAL_CAPSULE | ORAL | 0 refills | Status: DC

## 2020-09-20 ENCOUNTER — Encounter (INDEPENDENT_AMBULATORY_CARE_PROVIDER_SITE_OTHER): Payer: Self-pay | Admitting: Bariatrics

## 2020-09-20 NOTE — Progress Notes (Signed)
Chief Complaint:   OBESITY Alicia Lowery is here to discuss her progress with her obesity treatment plan along with follow-up of her obesity related diagnoses. Alicia Lowery is on the Category 2 Plan and states she is following her eating plan approximately 80% of the time. Alicia Lowery states she is walking 25-30 minutes 3 times per week.  Today's visit was #: 23 Starting weight: 246 lbs Starting date: 06/23/2020 Today's weight: 240 lbs Today's date: 09/19/2020 Total lbs lost to date: 6 Total lbs lost since last in-office visit: 2  Interim History: Alicia Lowery is down 2 lbs since her last visit. She is drinking more water.  Subjective:   1. Vitamin D deficiency Alicia Lowery denies nausea, vomiting, and muscle weakness. Pt is on prescription Vit D.  2. Other hyperlipidemia Alicia Lowery is taking Crestor.  3. At risk for heart disease Alicia Lowery is at a higher than average risk for cardiovascular disease due to obesity and hyperlipidemia.  Assessment/Plan:   1. Vitamin D deficiency Low Vitamin D level contributes to fatigue and are associated with obesity, breast, and colon cancer. She agrees to continue to take prescription Vitamin D @50 ,000 IU every week and will follow-up for routine testing of Vitamin D, at least 2-3 times per year to avoid over-replacement.  - Vitamin D, Ergocalciferol, (DRISDOL) 1.25 MG (50000 UNIT) CAPS capsule; Take once every 2 weeks.  Dispense: 2 capsule; Refill: 0  2. Other hyperlipidemia Cardiovascular risk and specific lipid/LDL goals reviewed.  We discussed several lifestyle modifications today and Alicia Lowery will continue to work on diet, exercise and weight loss efforts. Orders and follow up as documented in patient record. Continue Crestor.  Counseling Intensive lifestyle modifications are the first line treatment for this issue. . Dietary changes: Increase soluble fiber. Decrease simple carbohydrates. . Exercise changes: Moderate to vigorous-intensity aerobic activity 150 minutes per  week if tolerated. . Lipid-lowering medications: see documented in medical record.  3. At risk for heart disease Alicia Lowery was given approximately 15 minutes of coronary artery disease prevention counseling today. She is 60 y.o. female and has risk factors for heart disease including obesity. We discussed intensive lifestyle modifications today with an emphasis on specific weight loss instructions and strategies.   Repetitive spaced learning was employed today to elicit superior memory formation and behavioral change.  4. Obesity, current BMI 44  Alicia Lowery is currently in the action stage of change. As such, her goal is to continue with weight loss efforts. She has agreed to the Category 2 Plan.   Will drink more water.  Exercise goals: As is- be more active  Behavioral modification strategies: increasing lean protein intake, decreasing simple carbohydrates, increasing vegetables, increasing water intake, decreasing eating out, no skipping meals, meal planning and cooking strategies, keeping healthy foods in the home, dealing with family or coworker sabotage, travel eating strategies, holiday eating strategies  and celebration eating strategies.  Alicia Lowery has agreed to follow-up with our clinic in 3-4 weeks. She was informed of the importance of frequent follow-up visits to maximize her success with intensive lifestyle modifications for her multiple health conditions.   Objective:   Blood pressure 111/64, pulse 74, temperature 98.2 F (36.8 C), height 5\' 2"  (1.575 m), weight 240 lb (108.9 kg), SpO2 95 %. Body mass index is 43.9 kg/m.  General: Cooperative, alert, well developed, in no acute distress. HEENT: Conjunctivae and lids unremarkable. Cardiovascular: Regular rhythm.  Lungs: Normal work of breathing. Neurologic: No focal deficits.   Lab Results  Component Value Date   CREATININE  0.92 03/15/2020   BUN 15 03/15/2020   NA 140 03/15/2020   K 4.0 03/15/2020   CL 101 03/15/2020   CO2  28 03/15/2020   Lab Results  Component Value Date   ALT 25 03/15/2020   AST 20 03/15/2020   ALKPHOS 67 03/15/2020   BILITOT 0.8 03/15/2020   Lab Results  Component Value Date   HGBA1C 6.9 (A) 08/01/2020   HGBA1C 6.4 (H) 03/15/2020   HGBA1C 6.4 (H) 10/15/2019   HGBA1C 7.1 (A) 06/23/2019   HGBA1C 7.1 (A) 02/12/2019   Lab Results  Component Value Date   INSULIN 14.2 03/15/2020   INSULIN 17.6 06/24/2019   Lab Results  Component Value Date   TSH 1.280 06/24/2019   Lab Results  Component Value Date   CHOL 161 03/15/2020   HDL 48 03/15/2020   LDLCALC 96 03/15/2020   TRIG 90 03/15/2020   CHOLHDL 5.4 (H) 06/23/2019   Lab Results  Component Value Date   WBC 3.7 06/23/2019   HGB 13.1 06/23/2019   HCT 39.4 06/23/2019   MCV 91 06/23/2019   PLT 233 06/23/2019   No results found for: IRON, TIBC, FERRITIN  Attestation Statements:   Reviewed by clinician on day of visit: allergies, medications, problem list, medical history, surgical history, family history, social history, and previous encounter notes.  Edmund Hilda, CMA, am acting as Energy manager for [employer], DO.   I have reviewed the above documentation for accuracy and completeness, and I agree with the above. Corinna Capra, DO

## 2020-09-24 ENCOUNTER — Other Ambulatory Visit (INDEPENDENT_AMBULATORY_CARE_PROVIDER_SITE_OTHER): Payer: Self-pay | Admitting: Bariatrics

## 2020-09-24 DIAGNOSIS — E559 Vitamin D deficiency, unspecified: Secondary | ICD-10-CM

## 2020-09-28 ENCOUNTER — Other Ambulatory Visit: Payer: Self-pay | Admitting: Family Medicine

## 2020-09-28 DIAGNOSIS — E1169 Type 2 diabetes mellitus with other specified complication: Secondary | ICD-10-CM

## 2020-10-17 ENCOUNTER — Ambulatory Visit (INDEPENDENT_AMBULATORY_CARE_PROVIDER_SITE_OTHER): Payer: 59 | Admitting: Bariatrics

## 2020-10-17 ENCOUNTER — Encounter (INDEPENDENT_AMBULATORY_CARE_PROVIDER_SITE_OTHER): Payer: Self-pay | Admitting: Bariatrics

## 2020-10-17 ENCOUNTER — Other Ambulatory Visit: Payer: Self-pay

## 2020-10-17 VITALS — BP 125/72 | HR 66 | Temp 97.9°F | Ht 62.0 in | Wt 246.0 lb

## 2020-10-17 DIAGNOSIS — E559 Vitamin D deficiency, unspecified: Secondary | ICD-10-CM | POA: Diagnosis not present

## 2020-10-17 DIAGNOSIS — E1169 Type 2 diabetes mellitus with other specified complication: Secondary | ICD-10-CM | POA: Diagnosis not present

## 2020-10-17 DIAGNOSIS — E669 Obesity, unspecified: Secondary | ICD-10-CM

## 2020-10-17 DIAGNOSIS — Z6841 Body Mass Index (BMI) 40.0 and over, adult: Secondary | ICD-10-CM

## 2020-10-17 DIAGNOSIS — Z9189 Other specified personal risk factors, not elsewhere classified: Secondary | ICD-10-CM | POA: Diagnosis not present

## 2020-10-17 MED ORDER — VITAMIN D (ERGOCALCIFEROL) 1.25 MG (50000 UNIT) PO CAPS: ORAL_CAPSULE | ORAL | 0 refills | Status: DC

## 2020-10-19 ENCOUNTER — Encounter (INDEPENDENT_AMBULATORY_CARE_PROVIDER_SITE_OTHER): Payer: Self-pay | Admitting: Bariatrics

## 2020-10-19 NOTE — Progress Notes (Signed)
Chief Complaint:   OBESITY Alicia Lowery is here to discuss her progress with her obesity treatment plan along with follow-up of her obesity related diagnoses. Alicia Lowery is on the Category 2 Plan and states she is following her eating plan approximately 70% of the time. Alicia Lowery states she is walking 20 minutes 3 times per week.  Today's visit was #: 24 Starting weight: 246 lbs Starting date: 06/23/2020 Today's weight: 246 lbs Today's date: 10/17/2020 Total lbs lost to date: 0 Total lbs lost since last in-office visit: 0  Interim History: Alicia Lowery is up 6 lbs since her last visit. She is up 4 lbs of water per the bioimpedance scale. She is doing ok with the water.  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  2. Vitamin D deficiency Alicia Lowery denies nausea, vomiting, and muscle weakness.  3. At risk for osteoporosis Alicia Lowery is at higher risk of osteopenia and osteoporosis due to Vitamin D deficiency.   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 Intensive lifestyle modification including diet, exercise and weight loss are the first line of treatment for diabetes. Continue current treatment plan.  2. Vitamin D deficiency Low Vitamin D level contributes to fatigue and are associated with obesity, breast, and colon cancer. She agrees to continue to take prescription Vitamin D @50 ,000 IU every 2 weeks and will follow-up for routine testing of Vitamin D, at least 2-3 times per year to avoid over-replacement.  - Vitamin D, Ergocalciferol, (DRISDOL) 1.25 MG (50000 UNIT) CAPS capsule; Take once every 2 weeks.  Dispense: 2 capsule; Refill: 0  3. At risk for osteoporosis Alicia Lowery was given approximately 15 minutes of osteoporosis prevention  counseling today. Alicia Lowery is at risk for osteopenia and osteoporosis due to her Vitamin D deficiency. She was encouraged to take her Vitamin D and follow her higher calcium diet and increase strengthening exercise to help strengthen her bones and decrease her risk of osteopenia and osteoporosis.  Repetitive spaced learning was employed today to elicit superior memory formation and behavioral change.  4. Obesity with current BMI of 45.1  Alicia Lowery is currently in the action stage of change. As such, her goal is to continue with weight loss efforts. She has agreed to the Category 2 Plan.   Meal plan Continue exercise. Increase water intake No sodium  Exercise goals: As is  Behavioral modification strategies: increasing lean protein intake, decreasing simple carbohydrates, increasing vegetables, increasing water intake, decreasing eating out, no skipping meals, meal planning and cooking strategies, keeping healthy foods in the home and planning for success.  Alicia Lowery has agreed to follow-up with our clinic in 2-3 weeks. She was informed of the importance of frequent follow-up visits to maximize her success with intensive lifestyle modifications for her multiple health conditions.   Objective:   Blood pressure 125/72, pulse 66, temperature 97.9 F (36.6 C), height 5\' 2"  (1.575 m), weight 246 lb (111.6 kg), SpO2 96 %. Body mass index is 44.99 kg/m.  General: Cooperative, alert, well developed, in no acute distress. HEENT: Conjunctivae and lids unremarkable. Cardiovascular: Regular rhythm.  Lungs: Normal work of breathing. Neurologic: No focal deficits.   Lab Results  Component Value Date   CREATININE 0.92 03/15/2020   BUN 15 03/15/2020   NA 140 03/15/2020   K 4.0  03/15/2020   CL 101 03/15/2020   CO2 28 03/15/2020   Lab Results  Component Value Date   ALT 25 03/15/2020   AST 20 03/15/2020   ALKPHOS 67 03/15/2020   BILITOT 0.8 03/15/2020   Lab Results  Component Value Date    HGBA1C 6.9 (A) 08/01/2020   HGBA1C 6.4 (H) 03/15/2020   HGBA1C 6.4 (H) 10/15/2019   HGBA1C 7.1 (A) 06/23/2019   HGBA1C 7.1 (A) 02/12/2019   Lab Results  Component Value Date   INSULIN 14.2 03/15/2020   INSULIN 17.6 06/24/2019   Lab Results  Component Value Date   TSH 1.280 06/24/2019   Lab Results  Component Value Date   CHOL 161 03/15/2020   HDL 48 03/15/2020   LDLCALC 96 03/15/2020   TRIG 90 03/15/2020   CHOLHDL 5.4 (H) 06/23/2019   Lab Results  Component Value Date   WBC 3.7 06/23/2019   HGB 13.1 06/23/2019   HCT 39.4 06/23/2019   MCV 91 06/23/2019   PLT 233 06/23/2019   No results found for: IRON, TIBC, FERRITIN  Attestation Statements:   Reviewed by clinician on day of visit: allergies, medications, problem list, medical history, surgical history, family history, social history, and previous encounter notes.  Alicia Lowery, CMA, am acting as Energy manager for [employer], DO.  I have reviewed the above documentation for accuracy and completeness, and I agree with the above. Alicia Capra, DO

## 2020-10-23 ENCOUNTER — Other Ambulatory Visit (INDEPENDENT_AMBULATORY_CARE_PROVIDER_SITE_OTHER): Payer: Self-pay | Admitting: Bariatrics

## 2020-10-23 DIAGNOSIS — E559 Vitamin D deficiency, unspecified: Secondary | ICD-10-CM

## 2020-10-27 ENCOUNTER — Encounter (INDEPENDENT_AMBULATORY_CARE_PROVIDER_SITE_OTHER): Payer: Self-pay

## 2020-10-31 ENCOUNTER — Ambulatory Visit (INDEPENDENT_AMBULATORY_CARE_PROVIDER_SITE_OTHER): Payer: 59 | Admitting: Bariatrics

## 2020-11-17 ENCOUNTER — Ambulatory Visit (INDEPENDENT_AMBULATORY_CARE_PROVIDER_SITE_OTHER): Payer: 59 | Admitting: Bariatrics

## 2020-11-21 ENCOUNTER — Other Ambulatory Visit (INDEPENDENT_AMBULATORY_CARE_PROVIDER_SITE_OTHER): Payer: Self-pay | Admitting: Bariatrics

## 2020-11-21 DIAGNOSIS — E559 Vitamin D deficiency, unspecified: Secondary | ICD-10-CM

## 2020-11-21 NOTE — Telephone Encounter (Signed)
Pt last seen by Dr. Brown.  

## 2020-11-28 ENCOUNTER — Ambulatory Visit (INDEPENDENT_AMBULATORY_CARE_PROVIDER_SITE_OTHER): Payer: 59 | Admitting: Bariatrics

## 2020-11-28 ENCOUNTER — Encounter (INDEPENDENT_AMBULATORY_CARE_PROVIDER_SITE_OTHER): Payer: Self-pay | Admitting: Bariatrics

## 2020-11-28 ENCOUNTER — Other Ambulatory Visit: Payer: Self-pay

## 2020-11-28 VITALS — BP 106/65 | HR 72 | Temp 98.4°F | Ht 62.0 in | Wt 245.0 lb

## 2020-11-28 DIAGNOSIS — E7849 Other hyperlipidemia: Secondary | ICD-10-CM

## 2020-11-28 DIAGNOSIS — I10 Essential (primary) hypertension: Secondary | ICD-10-CM

## 2020-11-28 DIAGNOSIS — Z6841 Body Mass Index (BMI) 40.0 and over, adult: Secondary | ICD-10-CM

## 2020-12-05 ENCOUNTER — Encounter (INDEPENDENT_AMBULATORY_CARE_PROVIDER_SITE_OTHER): Payer: Self-pay | Admitting: Bariatrics

## 2020-12-05 NOTE — Progress Notes (Signed)
Chief Complaint:   OBESITY Raisa is here to discuss her progress with her obesity treatment plan along with follow-up of her obesity related diagnoses. Querida is on the Category 2 Plan and states she is following her eating plan approximately 70% of the time. Donyetta states she is walking and gardening 15-20 minutes 2-4 times per week.  Today's visit was #: 25 Starting weight: 246 lbs Starting date: 06/23/2020 Today's weight: 245 lbs Today's date: 11/28/2020 Total lbs lost to date: 1 Total lbs lost since last in-office visit: 1  Interim History: Trannie is down 1 lb since her last visit. She has increased her protein.  Subjective:   1. Other hyperlipidemia Mikesha is taking Crestor.  2. Essential hypertension She is taking Zestoretic.  Assessment/Plan:   1. Other hyperlipidemia Cardiovascular risk and specific lipid/LDL goals reviewed.  We discussed several lifestyle modifications today and Laurelle will continue to work on diet, exercise and weight loss efforts. Orders and follow up as documented in patient record. Continue current treatment plan.  Counseling Intensive lifestyle modifications are the first line treatment for this issue. Dietary changes: Increase soluble fiber. Decrease simple carbohydrates. Exercise changes: Moderate to vigorous-intensity aerobic activity 150 minutes per week if tolerated. Lipid-lowering medications: see documented in medical record.  2. Essential hypertension Keilly is working on healthy weight loss and exercise to improve blood pressure control. We will watch for signs of hypotension as she continues her lifestyle modifications. Continue current treatment plan.  3. Obesity with current BMI of 44.9  Mahogany is currently in the action stage of change. As such, her goal is to continue with weight loss efforts. She has agreed to the Category 2 Plan.   Meal planning Mindful eating Increase water intake No skipping meals  Exercise goals:   As is and will walk more.  Behavioral modification strategies: increasing lean protein intake, decreasing simple carbohydrates, increasing vegetables, increasing water intake, decreasing eating out, no skipping meals, meal planning and cooking strategies, keeping healthy foods in the home, and planning for success.  Karimah has agreed to follow-up with our clinic in 2-3 weeks. She was informed of the importance of frequent follow-up visits to maximize her success with intensive lifestyle modifications for her multiple health conditions.   Objective:   Blood pressure 106/65, pulse 72, temperature 98.4 F (36.9 C), height 5\' 2"  (1.575 m), weight 245 lb (111.1 kg), SpO2 96 %. Body mass index is 44.81 kg/m.  General: Cooperative, alert, well developed, in no acute distress. HEENT: Conjunctivae and lids unremarkable. Cardiovascular: Regular rhythm.  Lungs: Normal work of breathing. Neurologic: No focal deficits.   Lab Results  Component Value Date   CREATININE 0.92 03/15/2020   BUN 15 03/15/2020   NA 140 03/15/2020   K 4.0 03/15/2020   CL 101 03/15/2020   CO2 28 03/15/2020   Lab Results  Component Value Date   ALT 25 03/15/2020   AST 20 03/15/2020   ALKPHOS 67 03/15/2020   BILITOT 0.8 03/15/2020   Lab Results  Component Value Date   HGBA1C 6.9 (A) 08/01/2020   HGBA1C 6.4 (H) 03/15/2020   HGBA1C 6.4 (H) 10/15/2019   HGBA1C 7.1 (A) 06/23/2019   HGBA1C 7.1 (A) 02/12/2019   Lab Results  Component Value Date   INSULIN 14.2 03/15/2020   INSULIN 17.6 06/24/2019   Lab Results  Component Value Date   TSH 1.280 06/24/2019   Lab Results  Component Value Date   CHOL 161 03/15/2020   HDL 48  03/15/2020   LDLCALC 96 03/15/2020   TRIG 90 03/15/2020   CHOLHDL 5.4 (H) 06/23/2019   Lab Results  Component Value Date   VD25OH 55.5 03/15/2020   VD25OH 57.6 10/15/2019   VD25OH 14.7 (L) 06/24/2019   Lab Results  Component Value Date   WBC 3.7 06/23/2019   HGB 13.1 06/23/2019    HCT 39.4 06/23/2019   MCV 91 06/23/2019   PLT 233 06/23/2019    Attestation Statements:   Reviewed by clinician on day of visit: allergies, medications, problem list, medical history, surgical history, family history, social history, and previous encounter notes.  Time spent on visit including pre-visit chart review and post-visit care and charting was 20 minutes.   Edmund Hilda, CMA, am acting as Energy manager for [employer], DO.  I have reviewed the above documentation for accuracy and completeness, and I agree with the above. Corinna Capra, DO

## 2020-12-20 ENCOUNTER — Encounter (INDEPENDENT_AMBULATORY_CARE_PROVIDER_SITE_OTHER): Payer: Self-pay | Admitting: Bariatrics

## 2020-12-20 ENCOUNTER — Other Ambulatory Visit: Payer: Self-pay

## 2020-12-20 ENCOUNTER — Ambulatory Visit (INDEPENDENT_AMBULATORY_CARE_PROVIDER_SITE_OTHER): Payer: 59 | Admitting: Bariatrics

## 2020-12-20 VITALS — BP 118/73 | HR 69 | Temp 98.4°F | Ht 62.0 in | Wt 252.0 lb

## 2020-12-20 DIAGNOSIS — E559 Vitamin D deficiency, unspecified: Secondary | ICD-10-CM | POA: Diagnosis not present

## 2020-12-20 DIAGNOSIS — E1169 Type 2 diabetes mellitus with other specified complication: Secondary | ICD-10-CM

## 2020-12-20 DIAGNOSIS — Z9189 Other specified personal risk factors, not elsewhere classified: Secondary | ICD-10-CM

## 2020-12-20 DIAGNOSIS — Z6841 Body Mass Index (BMI) 40.0 and over, adult: Secondary | ICD-10-CM

## 2020-12-20 DIAGNOSIS — R609 Edema, unspecified: Secondary | ICD-10-CM

## 2020-12-20 MED ORDER — VITAMIN D (ERGOCALCIFEROL) 1.25 MG (50000 UNIT) PO CAPS: ORAL_CAPSULE | ORAL | 0 refills | Status: DC

## 2020-12-20 MED ORDER — FUROSEMIDE 20 MG PO TABS: 20.0000 mg | ORAL_TABLET | ORAL | 0 refills | Status: DC

## 2020-12-20 NOTE — Patient Instructions (Signed)
Health Maintenance Due  Topic Date Due   HIV Screening  Never done   MAMMOGRAM  06/20/2009   Pneumococcal Vaccine 62-61 Years old (3 - PPSV23 or PCV20) 04/14/2018   OPHTHALMOLOGY EXAM  01/15/2020   COVID-19 Vaccine (4 - Booster for Pfizer series) 07/04/2020   FOOT EXAM  12/09/2020   INFLUENZA VACCINE  12/12/2020    Depression screen PHQ 2/9 12/10/2019 06/24/2019 10/13/2018  Decreased Interest 0 1 0  Down, Depressed, Hopeless 0 0 0  PHQ - 2 Score 0 1 0  Altered sleeping - 1 -  Tired, decreased energy - 1 -  Change in appetite - 2 -  Feeling bad or failure about yourself  - 0 -  Trouble concentrating - 1 -  Moving slowly or fidgety/restless - 0 -  Suicidal thoughts - 0 -  PHQ-9 Score - 6 -

## 2020-12-21 ENCOUNTER — Encounter (INDEPENDENT_AMBULATORY_CARE_PROVIDER_SITE_OTHER): Payer: Self-pay | Admitting: Bariatrics

## 2020-12-21 NOTE — Progress Notes (Signed)
Chief Complaint:   OBESITY Alicia Lowery is here to discuss her progress with her obesity treatment plan along with follow-up of her obesity related diagnoses. Alicia Lowery is on the Category 2 Plan and states she is following her eating plan approximately 75% of the time. Alicia Lowery states she is walking for 20 minutes 3 times per week.  Today's visit was #: 26 Starting weight: 246 lbs Starting date: 06/23/2020 Today's weight: 252 lbs Today's date: 12/20/2020 Total lbs lost to date: 0 Total lbs lost since last in-office visit: 0  Interim History: Alicia Lowery is up 7 lbs since her last visit. Her water consumption is down. She has been on her feet more.  Subjective:   1. Vitamin D deficiency Alicia Lowery is taking Vit D as directed.  2. Type 2 diabetes mellitus with other specified complication, without long-term current use of insulin (HCC) Alicia Lowery is taking metformin and Ozempic with no side effects.  3. Peripheral edema Alicia Lowery notes edema with no pain.  4. At risk for osteoporosis Alicia Lowery is at higher risk of osteopenia and osteoporosis due to Vitamin D deficiency.   Assessment/Plan:   1. Vitamin D deficiency Low Vitamin D level contributes to fatigue and are associated with obesity, breast, and colon cancer. We will refill prescription Vitamin D for 1 month. Alicia Lowery will follow-up for routine testing of Vitamin D, at least 2-3 times per year to avoid over-replacement.  - Vitamin D, Ergocalciferol, (DRISDOL) 1.25 MG (50000 UNIT) CAPS capsule; Take once every 2 weeks.  Dispense: 2 capsule; Refill: 0  2. Type 2 diabetes mellitus with other specified complication, without long-term current use of insulin (HCC) Alicia Lowery will continue her medications. Good blood sugar control is important to decrease the likelihood of diabetic complications such as nephropathy, neuropathy, limb loss, blindness, coronary artery disease, and death. Intensive lifestyle modification including diet, exercise and weight loss are  the first line of treatment for diabetes.   3. Peripheral edema Alicia Lowery will continue Lasix, and we will refill Lasix for 1 month.  - furosemide (LASIX) 20 MG tablet; Take 1 tablet (20 mg total) by mouth every 3 (three) days.  Dispense: 10 tablet; Refill: 0  4. At risk for osteoporosis Alicia Lowery was given approximately 15 minutes of osteoporosis prevention counseling today. Alicia Lowery is at risk for osteopenia and osteoporosis due to her Vitamin D deficiency. She was encouraged to take her Vitamin D and follow her higher calcium diet and increase strengthening exercise to help strengthen her bones and decrease her risk of osteopenia and osteoporosis.  Repetitive spaced learning was employed today to elicit superior memory formation and behavioral change.  5. Obesity with current BMI of 46.2 Alicia Lowery is currently in the action stage of change. As such, her goal is to continue with weight loss efforts. She has agreed to the Category 2 Plan.   Alicia Lowery will adhere closely to the plan at least 90% of the time.   Exercise goals: As is.  Behavioral modification strategies: increasing lean protein intake, decreasing simple carbohydrates, increasing vegetables, increasing water intake, decreasing eating out, no skipping meals, meal planning and cooking strategies, keeping healthy foods in the home, and planning for success.  Alicia Lowery has agreed to follow-up with our clinic in 2 to 3 weeks. She was informed of the importance of frequent follow-up visits to maximize her success with intensive lifestyle modifications for her multiple health conditions.   Objective:   Blood pressure 118/73, pulse 69, temperature 98.4 F (36.9 C), height 5\' 2"  (1.575 m),  weight 252 lb (114.3 kg), SpO2 94 %. Body mass index is 46.09 kg/m.  Bilateral edema General: Cooperative, alert, well developed, in no acute distress. HEENT: Conjunctivae and lids unremarkable. Cardiovascular: Regular rhythm.  Lungs: Normal work of  breathing. Neurologic: No focal deficits.   Lab Results  Component Value Date   CREATININE 0.92 03/15/2020   BUN 15 03/15/2020   NA 140 03/15/2020   K 4.0 03/15/2020   CL 101 03/15/2020   CO2 28 03/15/2020   Lab Results  Component Value Date   ALT 25 03/15/2020   AST 20 03/15/2020   ALKPHOS 67 03/15/2020   BILITOT 0.8 03/15/2020   Lab Results  Component Value Date   HGBA1C 6.9 (A) 08/01/2020   HGBA1C 6.4 (H) 03/15/2020   HGBA1C 6.4 (H) 10/15/2019   HGBA1C 7.1 (A) 06/23/2019   HGBA1C 7.1 (A) 02/12/2019   Lab Results  Component Value Date   INSULIN 14.2 03/15/2020   INSULIN 17.6 06/24/2019   Lab Results  Component Value Date   TSH 1.280 06/24/2019   Lab Results  Component Value Date   CHOL 161 03/15/2020   HDL 48 03/15/2020   LDLCALC 96 03/15/2020   TRIG 90 03/15/2020   CHOLHDL 5.4 (H) 06/23/2019   Lab Results  Component Value Date   VD25OH 55.5 03/15/2020   VD25OH 57.6 10/15/2019   VD25OH 14.7 (L) 06/24/2019   Lab Results  Component Value Date   WBC 3.7 06/23/2019   HGB 13.1 06/23/2019   HCT 39.4 06/23/2019   MCV 91 06/23/2019   PLT 233 06/23/2019   No results found for: IRON, TIBC, FERRITIN  Attestation Statements:   Reviewed by clinician on day of visit: allergies, medications, problem list, medical history, surgical history, family history, social history, and previous encounter notes.   Trude Mcburney, am acting as Energy manager for [employer], DO.  I have reviewed the above documentation for accuracy and completeness, and I agree with the above. Corinna Capra, DO

## 2020-12-28 ENCOUNTER — Other Ambulatory Visit: Payer: Self-pay | Admitting: Family Medicine

## 2020-12-28 DIAGNOSIS — E1169 Type 2 diabetes mellitus with other specified complication: Secondary | ICD-10-CM

## 2020-12-28 DIAGNOSIS — E785 Hyperlipidemia, unspecified: Secondary | ICD-10-CM

## 2021-01-02 ENCOUNTER — Other Ambulatory Visit: Payer: Self-pay

## 2021-01-02 ENCOUNTER — Ambulatory Visit (INDEPENDENT_AMBULATORY_CARE_PROVIDER_SITE_OTHER): Payer: 59 | Admitting: Bariatrics

## 2021-01-02 ENCOUNTER — Encounter (INDEPENDENT_AMBULATORY_CARE_PROVIDER_SITE_OTHER): Payer: Self-pay | Admitting: Bariatrics

## 2021-01-02 VITALS — BP 120/80 | HR 64 | Temp 98.0°F | Ht 62.0 in | Wt 249.0 lb

## 2021-01-02 DIAGNOSIS — R600 Localized edema: Secondary | ICD-10-CM

## 2021-01-02 DIAGNOSIS — E7849 Other hyperlipidemia: Secondary | ICD-10-CM

## 2021-01-02 DIAGNOSIS — E669 Obesity, unspecified: Secondary | ICD-10-CM

## 2021-01-02 DIAGNOSIS — E1169 Type 2 diabetes mellitus with other specified complication: Secondary | ICD-10-CM | POA: Diagnosis not present

## 2021-01-02 DIAGNOSIS — Z6841 Body Mass Index (BMI) 40.0 and over, adult: Secondary | ICD-10-CM

## 2021-01-03 NOTE — Progress Notes (Signed)
Chief Complaint:   OBESITY Alicia Lowery is here to discuss her progress with her obesity treatment plan along with follow-up of her obesity related diagnoses. Lynnley is on the Category 2 Plan and states she is following her eating plan approximately 75% of the time. Vonetta states she is doing 0 minutes 0 times per week.  Today's visit was #: 27 Starting weight: 246 lbs Starting date: 06/23/2020 Today's weight: 249 lbs Today's date: 01/02/2021 Total lbs lost to date: 0 Total lbs lost since last in-office visit: 3 lbs  Interim History: Alicia Lowery is down 3 lbs since her last visit. She is taking her water pills. She is drinking more water.  Subjective:   1. Type 2 diabetes mellitus with obesity (HCC) Alicia Lowery is currently taking Metformin and Ozempic.  2. Other hyperlipidemia Alicia Lowery is currently taking Crestor.  3. Extremity edema Alicia Lowery is currently taking Lasix every 3 days.  Assessment/Plan:   1. Type 2 diabetes mellitus with obesity (HCC) Alicia Lowery will continue medication. Good blood sugar control is important to decrease the likelihood of diabetic complications such as nephropathy, neuropathy, limb loss, blindness, coronary artery disease, and death. Intensive lifestyle modification including diet, exercise and weight loss are the first line of treatment for diabetes.    2. Other hyperlipidemia Cardiovascular risk and specific lipid/LDL goals reviewed.  We discussed several lifestyle modifications today and Nuvia will continue to work on diet, exercise and weight loss efforts. Tashunda will continue Crestor.Orders and follow up as documented in patient record.   Counseling Intensive lifestyle modifications are the first line treatment for this issue. Dietary changes: Increase soluble fiber. Decrease simple carbohydrates. Exercise changes: Moderate to vigorous-intensity aerobic activity 150 minutes per week if tolerated. Lipid-lowering medications: see documented in medical  record.   3. Extremity edema Yu will continue Lasix as directed.  4. Obesity with current BMI of 45.7 Alicia Lowery is currently in the action stage of change. As such, her goal is to continue with weight loss efforts. She has agreed to the Category 2 Plan.   Alicia Lowery will continue meal planning and intentional eating. She will increase H2O (64 oz or more).  Exercise goals: No exercise has been prescribed at this time.  Behavioral modification strategies: increasing lean protein intake, decreasing simple carbohydrates, increasing vegetables, increasing water intake, decreasing eating out, no skipping meals, meal planning and cooking strategies, keeping healthy foods in the home, and planning for success.  Alicia Lowery has agreed to follow-up with our clinic in 3-4 weeks. She was informed of the importance of frequent follow-up visits to maximize her success with intensive lifestyle modifications for her multiple health conditions.   Objective:   Blood pressure 120/80, pulse 64, temperature 98 F (36.7 C), height 5\' 2"  (1.575 m), weight 249 lb (112.9 kg), SpO2 94 %. Body mass index is 45.54 kg/m.  General: Cooperative, alert, well developed, in no acute distress. HEENT: Conjunctivae and lids unremarkable. Cardiovascular: Regular rhythm.  Lungs: Normal work of breathing. Neurologic: No focal deficits.   Lab Results  Component Value Date   CREATININE 0.92 03/15/2020   BUN 15 03/15/2020   NA 140 03/15/2020   K 4.0 03/15/2020   CL 101 03/15/2020   CO2 28 03/15/2020   Lab Results  Component Value Date   ALT 25 03/15/2020   AST 20 03/15/2020   ALKPHOS 67 03/15/2020   BILITOT 0.8 03/15/2020   Lab Results  Component Value Date   HGBA1C 6.9 (A) 08/01/2020   HGBA1C 6.4 (H) 03/15/2020  HGBA1C 6.4 (H) 10/15/2019   HGBA1C 7.1 (A) 06/23/2019   HGBA1C 7.1 (A) 02/12/2019   Lab Results  Component Value Date   INSULIN 14.2 03/15/2020   INSULIN 17.6 06/24/2019   Lab Results  Component  Value Date   TSH 1.280 06/24/2019   Lab Results  Component Value Date   CHOL 161 03/15/2020   HDL 48 03/15/2020   LDLCALC 96 03/15/2020   TRIG 90 03/15/2020   CHOLHDL 5.4 (H) 06/23/2019   Lab Results  Component Value Date   VD25OH 55.5 03/15/2020   VD25OH 57.6 10/15/2019   VD25OH 14.7 (L) 06/24/2019   Lab Results  Component Value Date   WBC 3.7 06/23/2019   HGB 13.1 06/23/2019   HCT 39.4 06/23/2019   MCV 91 06/23/2019   PLT 233 06/23/2019   No results found for: IRON, TIBC, FERRITIN  Attestation Statements:   Reviewed by clinician on day of visit: allergies, medications, problem list, medical history, surgical history, family history, social history, and previous encounter notes.  Time spent on visit including pre-visit chart review and post-visit care and charting was 20 minutes.   I, Jackson Latino, RMA, am acting as Energy manager for [employer], DO.   I have reviewed the above documentation for accuracy and completeness, and I agree with the above. Corinna Capra, DO

## 2021-01-04 ENCOUNTER — Encounter (INDEPENDENT_AMBULATORY_CARE_PROVIDER_SITE_OTHER): Payer: Self-pay | Admitting: Bariatrics

## 2021-01-09 ENCOUNTER — Other Ambulatory Visit (INDEPENDENT_AMBULATORY_CARE_PROVIDER_SITE_OTHER): Payer: Self-pay | Admitting: Bariatrics

## 2021-01-09 DIAGNOSIS — E559 Vitamin D deficiency, unspecified: Secondary | ICD-10-CM

## 2021-01-09 NOTE — Telephone Encounter (Signed)
Pt last seen by Dr. Brown.  

## 2021-01-25 ENCOUNTER — Encounter (INDEPENDENT_AMBULATORY_CARE_PROVIDER_SITE_OTHER): Payer: Self-pay | Admitting: Bariatrics

## 2021-01-25 ENCOUNTER — Ambulatory Visit (INDEPENDENT_AMBULATORY_CARE_PROVIDER_SITE_OTHER): Payer: 59 | Admitting: Bariatrics

## 2021-01-25 ENCOUNTER — Other Ambulatory Visit: Payer: Self-pay

## 2021-01-25 VITALS — BP 120/90 | HR 77 | Temp 97.9°F | Ht 62.0 in | Wt 252.0 lb

## 2021-01-25 DIAGNOSIS — E1169 Type 2 diabetes mellitus with other specified complication: Secondary | ICD-10-CM | POA: Diagnosis not present

## 2021-01-25 DIAGNOSIS — Z9189 Other specified personal risk factors, not elsewhere classified: Secondary | ICD-10-CM

## 2021-01-25 DIAGNOSIS — R609 Edema, unspecified: Secondary | ICD-10-CM | POA: Diagnosis not present

## 2021-01-25 DIAGNOSIS — E559 Vitamin D deficiency, unspecified: Secondary | ICD-10-CM | POA: Diagnosis not present

## 2021-01-25 DIAGNOSIS — E669 Obesity, unspecified: Secondary | ICD-10-CM

## 2021-01-25 DIAGNOSIS — Z6841 Body Mass Index (BMI) 40.0 and over, adult: Secondary | ICD-10-CM

## 2021-01-25 MED ORDER — VITAMIN D (ERGOCALCIFEROL) 1.25 MG (50000 UNIT) PO CAPS: ORAL_CAPSULE | ORAL | 0 refills | Status: DC

## 2021-01-25 MED ORDER — FUROSEMIDE 20 MG PO TABS: 20.0000 mg | ORAL_TABLET | ORAL | 0 refills | Status: DC

## 2021-01-25 NOTE — Progress Notes (Signed)
Chief Complaint:   OBESITY Alicia Lowery is here to discuss her progress with her obesity treatment plan along with follow-up of her obesity related diagnoses. Alicia Lowery is on the Category 2 Plan and states she is following her eating plan approximately 50% of the time. Alicia Lowery states she is walking for 20 minutes 3 times per week.  Today's visit was #: 28 Starting weight: 246 lbs Starting date: 06/23/2020 Today's weight: 252 lbs Today's date: 01/25/2021 Total lbs lost to date: 0 Total lbs lost since last in-office visit: 3 lbs  Interim History: Alicia Lowery is down 3 lbs since her last visit. She is doing okay with her protein.  Subjective:   1. Peripheral edema Alicia Lowery has decreased fluid.  2. Vitamin D deficiency Alicia Lowery is currently taking as directed.  3. Type 2 diabetes mellitus with obesity (HCC) Alicia Lowery is currently taking Metformin and Ozempic.  4. At risk for hypoglycemia Alicia Lowery is at risk for hypoglycemia due to diet change, walking and diabetes mellitus.   Assessment/Plan:   1. Peripheral edema We will refill Lasix 20 mg by mouth every 3 days with no refills. She will increase H2O.   - furosemide (LASIX) 20 MG tablet; Take 1 tablet (20 mg total) by mouth every 3 (three) days.  Dispense: 10 tablet; Refill: 0  2. Vitamin D deficiency Low Vitamin D level contributes to fatigue and are associated with obesity, breast, and colon cancer. We will refill prescription Vitamin D 50,000 IU every two weeks for 1 month with no refills and Alicia Lowery will follow-up for routine testing of Vitamin D, at least 2-3 times per year to avoid over-replacement.  - Vitamin D, Ergocalciferol, (DRISDOL) 1.25 MG (50000 UNIT) CAPS capsule; Take once every 2 weeks.  Dispense: 2 capsule; Refill: 0  3. Type 2 diabetes mellitus with obesity (HCC) Alicia Lowery will continue medications. Good blood sugar control is important to decrease the likelihood of diabetic complications such as nephropathy, neuropathy, limb loss,  blindness, coronary artery disease, and death. Intensive lifestyle modification including diet, exercise and weight loss are the first line of treatment for diabetes.   4. At risk for hypoglycemia Alicia Lowery was given approximately 15 minutes of counseling today regarding prevention of hypoglycemia. She was advised of symptoms of hypoglycemia. Alicia Lowery was instructed to avoid skipping meals, eat regular protein rich meals and schedule low calorie snacks as needed.   Repetitive spaced learning was employed today to elicit superior memory formation and behavioral change   5. Obesity with current BMI of 45.7 Alicia Lowery is currently in the action stage of change. As such, her goal is to continue with weight loss efforts. She has agreed to the Category 2 Plan.   Alicia Lowery will adhere more closely to the diet. She will continue meal planning.  Exercise goals:  As is.  Behavioral modification strategies: increasing lean protein intake, decreasing simple carbohydrates, increasing vegetables, increasing water intake, decreasing eating out, no skipping meals, meal planning and cooking strategies, keeping healthy foods in the home, and planning for success.  Alicia Lowery has agreed to follow-up with our clinic in 3-4 weeks. She was informed of the importance of frequent follow-up visits to maximize her success with intensive lifestyle modifications for her multiple health conditions.   Objective:   Blood pressure 120/90, pulse 77, temperature 97.9 F (36.6 C), height 5\' 2"  (1.575 m), weight 252 lb (114.3 kg), SpO2 96 %. Body mass index is 46.09 kg/m.  General: Cooperative, alert, well developed, in no acute distress. HEENT: Conjunctivae and lids  unremarkable. Cardiovascular: Regular rhythm.  Lungs: Normal work of breathing. Neurologic: No focal deficits.   Lab Results  Component Value Date   CREATININE 0.92 03/15/2020   BUN 15 03/15/2020   NA 140 03/15/2020   K 4.0 03/15/2020   CL 101 03/15/2020   CO2 28  03/15/2020   Lab Results  Component Value Date   ALT 25 03/15/2020   AST 20 03/15/2020   ALKPHOS 67 03/15/2020   BILITOT 0.8 03/15/2020   Lab Results  Component Value Date   HGBA1C 6.9 (A) 08/01/2020   HGBA1C 6.4 (H) 03/15/2020   HGBA1C 6.4 (H) 10/15/2019   HGBA1C 7.1 (A) 06/23/2019   HGBA1C 7.1 (A) 02/12/2019   Lab Results  Component Value Date   INSULIN 14.2 03/15/2020   INSULIN 17.6 06/24/2019   Lab Results  Component Value Date   TSH 1.280 06/24/2019   Lab Results  Component Value Date   CHOL 161 03/15/2020   HDL 48 03/15/2020   LDLCALC 96 03/15/2020   TRIG 90 03/15/2020   CHOLHDL 5.4 (H) 06/23/2019   Lab Results  Component Value Date   VD25OH 55.5 03/15/2020   VD25OH 57.6 10/15/2019   VD25OH 14.7 (L) 06/24/2019   Lab Results  Component Value Date   WBC 3.7 06/23/2019   HGB 13.1 06/23/2019   HCT 39.4 06/23/2019   MCV 91 06/23/2019   PLT 233 06/23/2019   No results found for: IRON, TIBC, FERRITIN  Attestation Statements:   Reviewed by clinician on day of visit: allergies, medications, problem list, medical history, surgical history, family history, social history, and previous encounter notes.  I, Jackson Latino, RMA, am acting as Energy manager for [employer], DO.   I have reviewed the above documentation for accuracy and completeness, and I agree with the above. Corinna Capra, DO

## 2021-01-26 ENCOUNTER — Encounter (INDEPENDENT_AMBULATORY_CARE_PROVIDER_SITE_OTHER): Payer: Self-pay | Admitting: Bariatrics

## 2021-02-06 ENCOUNTER — Other Ambulatory Visit: Payer: Self-pay

## 2021-02-06 ENCOUNTER — Ambulatory Visit: Payer: 59 | Admitting: Family Medicine

## 2021-02-06 ENCOUNTER — Encounter: Payer: Self-pay | Admitting: Family Medicine

## 2021-02-06 VITALS — BP 114/76 | HR 81 | Temp 96.6°F | Ht 61.75 in | Wt 252.6 lb

## 2021-02-06 DIAGNOSIS — E118 Type 2 diabetes mellitus with unspecified complications: Secondary | ICD-10-CM

## 2021-02-06 DIAGNOSIS — E785 Hyperlipidemia, unspecified: Secondary | ICD-10-CM

## 2021-02-06 DIAGNOSIS — E1159 Type 2 diabetes mellitus with other circulatory complications: Secondary | ICD-10-CM

## 2021-02-06 DIAGNOSIS — Z0000 Encounter for general adult medical examination without abnormal findings: Secondary | ICD-10-CM | POA: Diagnosis not present

## 2021-02-06 DIAGNOSIS — Z8669 Personal history of other diseases of the nervous system and sense organs: Secondary | ICD-10-CM | POA: Diagnosis not present

## 2021-02-06 DIAGNOSIS — Z1231 Encounter for screening mammogram for malignant neoplasm of breast: Secondary | ICD-10-CM

## 2021-02-06 DIAGNOSIS — E559 Vitamin D deficiency, unspecified: Secondary | ICD-10-CM

## 2021-02-06 DIAGNOSIS — M109 Gout, unspecified: Secondary | ICD-10-CM

## 2021-02-06 DIAGNOSIS — Z23 Encounter for immunization: Secondary | ICD-10-CM

## 2021-02-06 DIAGNOSIS — I152 Hypertension secondary to endocrine disorders: Secondary | ICD-10-CM

## 2021-02-06 DIAGNOSIS — J301 Allergic rhinitis due to pollen: Secondary | ICD-10-CM | POA: Diagnosis not present

## 2021-02-06 DIAGNOSIS — Z8249 Family history of ischemic heart disease and other diseases of the circulatory system: Secondary | ICD-10-CM

## 2021-02-06 DIAGNOSIS — E1169 Type 2 diabetes mellitus with other specified complication: Secondary | ICD-10-CM

## 2021-02-06 LAB — POCT GLYCOSYLATED HEMOGLOBIN (HGB A1C): Hemoglobin A1C: 6.4 % — AB (ref 4.0–5.6)

## 2021-02-06 MED ORDER — LISINOPRIL-HYDROCHLOROTHIAZIDE 20-12.5 MG PO TABS: 1.0000 | ORAL_TABLET | Freq: Every day | ORAL | 3 refills | Status: DC

## 2021-02-06 MED ORDER — ROSUVASTATIN CALCIUM 20 MG PO TABS: 20.0000 mg | ORAL_TABLET | Freq: Every day | ORAL | 3 refills | Status: DC

## 2021-02-06 NOTE — Progress Notes (Signed)
   Subjective:    Patient ID: Alicia Lowery, female    DOB: 06-04-1960, 60 y.o.   MRN: [id number]  HPI She is here for complete examination.  She is going to medical weight loss and wellness.  The medical information was reviewed and I did go over her weight and lack of much progress there.  She is also on the vitamin D supplementation.  Does use Lasix on an as-needed basis for fluid retention.  Continues on Crestor as well as lisinopril/HCTZ.  She is taking Ozempic as well as metformin for her diabetes.  She does have a previous history of gout but is not had any difficulty recently with that.  She does complain of some foot pain that does tend to go away and does point to the head of the metatarsals.  Her last blood work was less than a year ago.  She does not smoke or drink.  Otherwise family and social history as well as health maintenance and immunizations was reviewed.   Review of Systems  All other systems reviewed and are negative.     Objective:   Physical Exam Alert and in no distress. Tympanic membranes and canals are normal. Pharyngeal area is normal. Neck is supple without adenopathy or thyromegaly. Cardiac exam shows a regular sinus rhythm without murmurs or gallops. Lungs are clear to auscultation. Hemoglobin A1c is 6.9       Assessment & Plan:  Immunization, viral disease - Plan: [employer], [employer]  Obesity, morbid (HCC)  Hypertension associated with diabetes (HCC) - Plan: lisinopril-hydrochlorothiazide (ZESTORETIC) 20-12.5 MG tablet  Seasonal allergic rhinitis due to pollen  History of migraine headaches  Family history of heart disease in female family member before age 29  Type 2 diabetes mellitus with complications (HCC) - Plan: POCT glycosylated hemoglobin (Hb A1C)  Hyperlipidemia associated with type 2 diabetes mellitus (HCC) - Plan: rosuvastatin (CRESTOR) 20 MG tablet  Gout, unspecified cause, unspecified chronicity, unspecified  site  Vitamin D deficiency  Need for influenza vaccination - Plan: Flu Vaccine QUAD 82mo+IM (Fluarix, Fluzone & Alfiuria Quad PF)  Encounter for screening mammogram for malignant neoplasm of breast - Plan: MM Digital Screening I will defer blood work to medical weight loss and wellness.  Discussed diet and exercise with her and the fact that her weight is actually gone up in the last several months.  So encouraged her to make further changes with diet and exercise as well as continue on present medications.  No therapy needed for the gout.  Continue on her vitamin D supplementation. Defer to medical weight loss concerning blood work

## 2021-02-14 ENCOUNTER — Other Ambulatory Visit: Payer: Self-pay

## 2021-02-14 ENCOUNTER — Ambulatory Visit (INDEPENDENT_AMBULATORY_CARE_PROVIDER_SITE_OTHER): Payer: 59 | Admitting: Bariatrics

## 2021-02-14 ENCOUNTER — Encounter (INDEPENDENT_AMBULATORY_CARE_PROVIDER_SITE_OTHER): Payer: Self-pay | Admitting: Bariatrics

## 2021-02-14 VITALS — BP 134/83 | HR 72 | Temp 97.9°F | Ht 62.0 in | Wt 249.0 lb

## 2021-02-14 DIAGNOSIS — Z6841 Body Mass Index (BMI) 40.0 and over, adult: Secondary | ICD-10-CM | POA: Diagnosis not present

## 2021-02-14 DIAGNOSIS — R609 Edema, unspecified: Secondary | ICD-10-CM | POA: Diagnosis not present

## 2021-02-14 DIAGNOSIS — E559 Vitamin D deficiency, unspecified: Secondary | ICD-10-CM

## 2021-02-14 MED ORDER — VITAMIN D (ERGOCALCIFEROL) 1.25 MG (50000 UNIT) PO CAPS: ORAL_CAPSULE | ORAL | 0 refills | Status: DC

## 2021-02-14 MED ORDER — FUROSEMIDE 20 MG PO TABS: 20.0000 mg | ORAL_TABLET | ORAL | 0 refills | Status: DC

## 2021-02-14 NOTE — Progress Notes (Signed)
Chief Complaint:   OBESITY Alicia Lowery is here to discuss her progress with her obesity treatment plan along with follow-up of her obesity related diagnoses. Alicia Lowery is on the Category 2 Plan and states she is following her eating plan approximately 75% of the time. Alicia Lowery states she is walking for 25 minutes 3-4 times per week.  Today's visit was #: 29 Starting weight: 246 lbs Starting date: 06/23/2020 Today's weight: 249 lbs Today's date: 02/14/2021 Total lbs lost to date: 0 Total lbs lost since last in-office visit: 3 lbs  Interim History: Alicia Lowery is down 3 lbs since her last visit. She is doing okay with her water intake.  Subjective:   1. Vitamin D deficiency Khalessi is taking Vitamin D currently.  2. Peripheral edema Lugenia states the Lasix is helping.   Assessment/Plan:   1. Vitamin D deficiency Low Vitamin D level contributes to fatigue and are associated with obesity, breast, and colon cancer. We will refill prescription Vitamin D 50,000 IU every 2 weeks and Jadyn will follow-up for routine testing of Vitamin D, at least 2-3 times per year to avoid over-replacement.  - Vitamin D, Ergocalciferol, (DRISDOL) 1.25 MG (50000 UNIT) CAPS capsule; Take once every 2 weeks.  Dispense: 2 capsule; Refill: 0  2. Peripheral edema We will refill Lasix 20 mg every 3 days with no refills.   - furosemide (LASIX) 20 MG tablet; Take 1 tablet (20 mg total) by mouth every 3 (three) days.  Dispense: 10 tablet; Refill: 0  3. Obesity current BMI 45.7 Kerith is currently in the action stage of change. As such, her goal is to continue with weight loss efforts. She has agreed to the Category 2 Plan.   January will continue meal planning and intentional eating.   Exercise goals: Dilia will continue to walk along with water activities.   Behavioral modification strategies: increasing lean protein intake, decreasing simple carbohydrates, increasing vegetables, increasing water intake, decreasing  eating out, no skipping meals, meal planning and cooking strategies, keeping healthy foods in the home, and planning for success.  Meriah has agreed to follow-up with our clinic in 4 weeks. She was informed of the importance of frequent follow-up visits to maximize her success with intensive lifestyle modifications for her multiple health conditions.   Objective:   Blood pressure 134/83, pulse 72, temperature 97.9 F (36.6 C), height 5\' 2"  (1.575 m), weight 249 lb (112.9 kg), SpO2 96 %. Body mass index is 45.54 kg/m.  General: Cooperative, alert, well developed, in no acute distress. HEENT: Conjunctivae and lids unremarkable. Cardiovascular: Regular rhythm.  Lungs: Normal work of breathing. Neurologic: No focal deficits.   Lab Results  Component Value Date   CREATININE 0.92 03/15/2020   BUN 15 03/15/2020   NA 140 03/15/2020   K 4.0 03/15/2020   CL 101 03/15/2020   CO2 28 03/15/2020   Lab Results  Component Value Date   ALT 25 03/15/2020   AST 20 03/15/2020   ALKPHOS 67 03/15/2020   BILITOT 0.8 03/15/2020   Lab Results  Component Value Date   HGBA1C 6.4 (A) 02/06/2021   HGBA1C 6.9 (A) 08/01/2020   HGBA1C 6.4 (H) 03/15/2020   HGBA1C 6.4 (H) 10/15/2019   HGBA1C 7.1 (A) 06/23/2019   Lab Results  Component Value Date   INSULIN 14.2 03/15/2020   INSULIN 17.6 06/24/2019   Lab Results  Component Value Date   TSH 1.280 06/24/2019   Lab Results  Component Value Date   CHOL 161 03/15/2020  HDL 48 03/15/2020   LDLCALC 96 03/15/2020   TRIG 90 03/15/2020   CHOLHDL 5.4 (H) 06/23/2019   Lab Results  Component Value Date   VD25OH 55.5 03/15/2020   VD25OH 57.6 10/15/2019   VD25OH 14.7 (L) 06/24/2019   Lab Results  Component Value Date   WBC 3.7 06/23/2019   HGB 13.1 06/23/2019   HCT 39.4 06/23/2019   MCV 91 06/23/2019   PLT 233 06/23/2019   No results found for: IRON, TIBC, FERRITIN  Attestation Statements:   Reviewed by clinician on day of visit: allergies,  medications, problem list, medical history, surgical history, family history, social history, and previous encounter notes.  I, Jackson Latino, RMA, am acting as Energy manager for [employer], DO.   I have reviewed the above documentation for accuracy and completeness, and I agree with the above. Corinna Capra, DO

## 2021-02-15 ENCOUNTER — Encounter (INDEPENDENT_AMBULATORY_CARE_PROVIDER_SITE_OTHER): Payer: Self-pay | Admitting: Bariatrics

## 2021-02-19 ENCOUNTER — Other Ambulatory Visit (INDEPENDENT_AMBULATORY_CARE_PROVIDER_SITE_OTHER): Payer: Self-pay | Admitting: Bariatrics

## 2021-02-19 DIAGNOSIS — E559 Vitamin D deficiency, unspecified: Secondary | ICD-10-CM

## 2021-02-20 NOTE — Telephone Encounter (Signed)
LAST APPOINTMENT DATE: 02/14/21 NEXT APPOINTMENT DATE: 03/14/21   [id number] - [place],  - [address] [place] [address] Phone: [phone] Fax: [phone]  Patient is requesting a refill of the following medications: Requested Prescriptions   Pending Prescriptions Disp Refills   Vitamin D, Ergocalciferol, (DRISDOL) 1.25 MG (50000 UNIT) CAPS capsule [Pharmacy Med Name: VITAMIN D2 1.25MG (50,000 UNIT)] 2 capsule 0    Sig: TAKE ONCE EVERY 2 WEEKS.    Date last filled: 02/14/21 Previously prescribed by Dr. Manson Passey  Lab Results  Component Value Date   HGBA1C 6.4 (A) 02/06/2021   HGBA1C 6.9 (A) 08/01/2020   HGBA1C 6.4 (H) 03/15/2020   Lab Results  Component Value Date   MICROALBUR <5.0 06/23/2019   LDLCALC 96 03/15/2020   CREATININE 0.92 03/15/2020   Lab Results  Component Value Date   VD25OH 55.5 03/15/2020   VD25OH 57.6 10/15/2019   VD25OH 14.7 (L) 06/24/2019    BP Readings from Last 3 Encounters:  02/14/21 134/83  02/06/21 114/76  01/25/21 120/90

## 2021-02-20 NOTE — Telephone Encounter (Signed)
Rx refilled at last appointment on 02/14/21

## 2021-03-08 ENCOUNTER — Other Ambulatory Visit (INDEPENDENT_AMBULATORY_CARE_PROVIDER_SITE_OTHER): Payer: Self-pay | Admitting: Bariatrics

## 2021-03-08 DIAGNOSIS — R609 Edema, unspecified: Secondary | ICD-10-CM

## 2021-03-14 ENCOUNTER — Other Ambulatory Visit: Payer: Self-pay

## 2021-03-14 ENCOUNTER — Ambulatory Visit (INDEPENDENT_AMBULATORY_CARE_PROVIDER_SITE_OTHER): Payer: 59 | Admitting: Bariatrics

## 2021-03-14 VITALS — BP 121/82 | HR 69 | Temp 97.8°F | Ht 62.0 in | Wt 249.0 lb

## 2021-03-14 DIAGNOSIS — R609 Edema, unspecified: Secondary | ICD-10-CM

## 2021-03-14 DIAGNOSIS — E118 Type 2 diabetes mellitus with unspecified complications: Secondary | ICD-10-CM | POA: Diagnosis not present

## 2021-03-14 DIAGNOSIS — Z6841 Body Mass Index (BMI) 40.0 and over, adult: Secondary | ICD-10-CM

## 2021-03-14 NOTE — Progress Notes (Signed)
Chief Complaint:   OBESITY Alicia Lowery is here to discuss her progress with her obesity treatment plan along with follow-up of her obesity related diagnoses. Alicia Lowery is on the Category 2 Plan and states she is following her eating plan approximately 60% of the time. Alicia Lowery states she is walking for 20 minutes 3 times per week.  Today's visit was #: 30 Starting weight: 246 lbs Starting date: 06/23/2020 Today's weight: 248 lbs Today's date: 03/14/2021 Total lbs lost to date: 0 Total lbs lost since last in-office visit: 0  Interim History: Alicia Lowery weight remains the same. She has been out of town. She did not get enough water but is increasing it now.   Subjective:   1. Type 2 diabetes mellitus with complications (HCC) Alicia Lowery is taking Metformin and Ozempic currently.   2. Peripheral edema Alicia Lowery is currently taking Lasix.   Assessment/Plan:   1. Type 2 diabetes mellitus with complications (HCC) Alicia Lowery will continue her medications. Good blood sugar control is important to decrease the likelihood of diabetic complications such as nephropathy, neuropathy, limb loss, blindness, coronary artery disease, and death. Intensive lifestyle modification including diet, exercise and weight loss are the first line of treatment for diabetes.   2. Peripheral edema Alicia Lowery will continue her medications. She was reminded to stay well hydrated.   3. Class 3 severe obesity with serious comorbidity and body mass index (BMI) of 40.0 to 44.9 in adult, unspecified obesity type [facility] [place] is currently in the action stage of change. As such, her goal is to continue with weight loss efforts. She has agreed to the Category 3 Plan.   Alicia Lowery will continue meal planning. She will adhere to the meal plan at 80-90%. She will increase her water intake. Holiday strategies was given out today.   Exercise goals:  As is.   Behavioral modification strategies: increasing lean protein intake, decreasing simple  carbohydrates, increasing vegetables, increasing water intake, decreasing eating out, no skipping meals, meal planning and cooking strategies, keeping healthy foods in the home, and planning for success.  Alicia Lowery has agreed to follow-up with our clinic in 4 weeks (fasting). She was informed of the importance of frequent follow-up visits to maximize her success with intensive lifestyle modifications for her multiple health conditions.   Objective:   Blood pressure 121/82, pulse 69, temperature 97.8 F (36.6 C), height 5\' 2"  (1.575 m), weight 249 lb (112.9 kg), SpO2 94 %. Body mass index is 45.54 kg/m.  General: Cooperative, alert, well developed, in no acute distress. HEENT: Conjunctivae and lids unremarkable. Cardiovascular: Regular rhythm.  Lungs: Normal work of breathing. Neurologic: No focal deficits.   Lab Results  Component Value Date   CREATININE 0.92 03/15/2020   BUN 15 03/15/2020   NA 140 03/15/2020   K 4.0 03/15/2020   CL 101 03/15/2020   CO2 28 03/15/2020   Lab Results  Component Value Date   ALT 25 03/15/2020   AST 20 03/15/2020   ALKPHOS 67 03/15/2020   BILITOT 0.8 03/15/2020   Lab Results  Component Value Date   HGBA1C 6.4 (A) 02/06/2021   HGBA1C 6.9 (A) 08/01/2020   HGBA1C 6.4 (H) 03/15/2020   HGBA1C 6.4 (H) 10/15/2019   HGBA1C 7.1 (A) 06/23/2019   Lab Results  Component Value Date   INSULIN 14.2 03/15/2020   INSULIN 17.6 06/24/2019   Lab Results  Component Value Date   TSH 1.280 06/24/2019   Lab Results  Component Value Date   CHOL 161 03/15/2020  HDL 48 03/15/2020   LDLCALC 96 03/15/2020   TRIG 90 03/15/2020   CHOLHDL 5.4 (H) 06/23/2019   Lab Results  Component Value Date   VD25OH 55.5 03/15/2020   VD25OH 57.6 10/15/2019   VD25OH 14.7 (L) 06/24/2019   Lab Results  Component Value Date   WBC 3.7 06/23/2019   HGB 13.1 06/23/2019   HCT 39.4 06/23/2019   MCV 91 06/23/2019   PLT 233 06/23/2019   No results found for: IRON, TIBC,  FERRITIN  Attestation Statements:   Reviewed by clinician on day of visit: allergies, medications, problem list, medical history, surgical history, family history, social history, and previous encounter notes.  I, Lizbeth Bark, RMA, am acting as Location manager for [employer], DO.   I have reviewed the above documentation for accuracy and completeness, and I agree with the above. Jearld Lesch, DO

## 2021-03-15 ENCOUNTER — Encounter (INDEPENDENT_AMBULATORY_CARE_PROVIDER_SITE_OTHER): Payer: Self-pay | Admitting: Bariatrics

## 2021-03-27 ENCOUNTER — Other Ambulatory Visit (INDEPENDENT_AMBULATORY_CARE_PROVIDER_SITE_OTHER): Payer: Self-pay | Admitting: Bariatrics

## 2021-03-27 DIAGNOSIS — E559 Vitamin D deficiency, unspecified: Secondary | ICD-10-CM

## 2021-03-27 NOTE — Telephone Encounter (Signed)
LAST APPOINTMENT DATE: 03/14/21 NEXT APPOINTMENT DATE: 04/11/21   [id number] - [place], [place] - [address] [place] [address] Phone: [phone] Fax: [phone]  Patient is requesting a refill of the following medications: Pending Prescriptions:                       Disp   Refills   Vitamin D, Ergocalciferol, (DRISDOL) 1.25 *2 caps*0       Sig: TAKE 1 CAPSULE ONCE EVERY 2 WEEKS.   Date last filled: 02/14/21 Previously prescribed by Dr. Manson Passey  Lab Results      Component                Value               Date                      HGBA1C                   6.4 (A)             02/06/2021                HGBA1C                   6.9 (A)             08/01/2020                HGBA1C                   6.4 (H)             03/15/2020           Lab Results      Component                Value               Date                      MICROALBUR               <5.0                06/23/2019                LDLCALC                  96                  03/15/2020                CREATININE               0.92                03/15/2020           Lab Results      Component                Value               Date                      VD25OH                   55.5  03/15/2020                VD25OH                   57.6                10/15/2019                VD25OH                   14.7 (L)            06/24/2019            BP Readings from Last 3 Encounters: 03/14/21 : 121/82 02/14/21 : 134/83 02/06/21 : 114/76

## 2021-03-31 ENCOUNTER — Ambulatory Visit: Payer: Self-pay

## 2021-04-11 ENCOUNTER — Ambulatory Visit (INDEPENDENT_AMBULATORY_CARE_PROVIDER_SITE_OTHER): Payer: 59 | Admitting: Bariatrics

## 2021-04-19 LAB — HM DIABETES EYE EXAM

## 2021-04-23 ENCOUNTER — Other Ambulatory Visit (INDEPENDENT_AMBULATORY_CARE_PROVIDER_SITE_OTHER): Payer: Self-pay | Admitting: Bariatrics

## 2021-04-23 DIAGNOSIS — E559 Vitamin D deficiency, unspecified: Secondary | ICD-10-CM

## 2021-04-27 ENCOUNTER — Ambulatory Visit
Admission: RE | Admit: 2021-04-27 | Discharge: 2021-04-27 | Disposition: A | Discharge status: Home / Self Care | Payer: 59 | Source: Ambulatory Visit | Attending: Family Medicine | Admitting: Family Medicine

## 2021-05-02 ENCOUNTER — Other Ambulatory Visit: Payer: Self-pay

## 2021-05-02 ENCOUNTER — Encounter (INDEPENDENT_AMBULATORY_CARE_PROVIDER_SITE_OTHER): Payer: Self-pay | Admitting: Bariatrics

## 2021-05-02 ENCOUNTER — Ambulatory Visit (INDEPENDENT_AMBULATORY_CARE_PROVIDER_SITE_OTHER): Payer: 59 | Admitting: Bariatrics

## 2021-05-02 VITALS — BP 144/81 | HR 70 | Temp 97.9°F | Ht 62.0 in | Wt 252.0 lb

## 2021-05-02 DIAGNOSIS — E118 Type 2 diabetes mellitus with unspecified complications: Secondary | ICD-10-CM

## 2021-05-02 DIAGNOSIS — E559 Vitamin D deficiency, unspecified: Secondary | ICD-10-CM | POA: Diagnosis not present

## 2021-05-02 DIAGNOSIS — R600 Localized edema: Secondary | ICD-10-CM | POA: Diagnosis not present

## 2021-05-02 DIAGNOSIS — E785 Hyperlipidemia, unspecified: Secondary | ICD-10-CM

## 2021-05-02 DIAGNOSIS — E1169 Type 2 diabetes mellitus with other specified complication: Secondary | ICD-10-CM

## 2021-05-02 DIAGNOSIS — Z6841 Body Mass Index (BMI) 40.0 and over, adult: Secondary | ICD-10-CM

## 2021-05-02 MED ORDER — VITAMIN D (ERGOCALCIFEROL) 1.25 MG (50000 UNIT) PO CAPS: ORAL_CAPSULE | ORAL | 0 refills | Status: DC

## 2021-05-02 MED ORDER — FUROSEMIDE 20 MG PO TABS: 20.0000 mg | ORAL_TABLET | ORAL | 0 refills | Status: DC

## 2021-05-03 LAB — COMPREHENSIVE METABOLIC PANEL
ALT: 22 IU/L (ref 0–32)
AST: 21 IU/L (ref 0–40)
Albumin/Globulin Ratio: 1.8 (ref 1.2–2.2)
Albumin: 4.4 g/dL (ref 3.8–4.8)
Alkaline Phosphatase: 72 IU/L (ref 44–121)
BUN/Creatinine Ratio: 17 (ref 12–28)
BUN: 14 mg/dL (ref 8–27)
Bilirubin Total: 0.7 mg/dL (ref 0.0–1.2)
CO2: 26 mmol/L (ref 20–29)
Calcium: 9.7 mg/dL (ref 8.7–10.3)
Chloride: 101 mmol/L (ref 96–106)
Creatinine, Ser: 0.82 mg/dL (ref 0.57–1.00)
Globulin, Total: 2.5 g/dL (ref 1.5–4.5)
Glucose: 102 mg/dL — ABNORMAL HIGH (ref 70–99)
Potassium: 3.8 mmol/L (ref 3.5–5.2)
Sodium: 141 mmol/L (ref 134–144)
Total Protein: 6.9 g/dL (ref 6.0–8.5)
eGFR: 81 mL/min/{1.73_m2} (ref >59)

## 2021-05-03 LAB — HEMOGLOBIN A1C
Est. average glucose Bld gHb Est-mCnc: 160 mg/dL
Hgb A1c MFr Bld: 7.2 % — ABNORMAL HIGH (ref 4.8–5.6)

## 2021-05-03 LAB — LIPID PANEL WITH LDL/HDL RATIO
Cholesterol, Total: 161 mg/dL (ref 100–199)
HDL: 48 mg/dL (ref >39)
LDL Chol Calc (NIH): 93 mg/dL (ref 0–99)
LDL/HDL Ratio: 1.9 ratio (ref 0.0–3.2)
Triglycerides: 110 mg/dL (ref 0–149)
VLDL Cholesterol Cal: 20 mg/dL (ref 5–40)

## 2021-05-03 LAB — INSULIN, RANDOM: INSULIN: 13.8 u[IU]/mL (ref 2.6–24.9)

## 2021-05-03 LAB — VITAMIN D 25 HYDROXY (VIT D DEFICIENCY, FRACTURES): Vit D, 25-Hydroxy: 42.2 ng/mL (ref 30.0–100.0)

## 2021-05-03 NOTE — Progress Notes (Signed)
Chief Complaint:   OBESITY Alicia Lowery is here to discuss her progress with her obesity treatment plan along with follow-up of her obesity related diagnoses. Alicia Lowery is on the Category 3 Plan and states she is following her eating plan approximately 75% of the time. Alicia Lowery states she is walking for 20-25 minutes 3 times per week.  Today's visit was #: 31 Starting weight: 246 lbs Starting date: 06/23/2020 Today's weight: 252 lbs Today's date: 05/02/2021 Total lbs lost to date: 0 Total lbs lost since last in-office visit: 0  Interim History: Alicia Lowery is up 3 lbs since her last visit. She is drinking her water and getting in her protein.  Subjective:   1. Edema, lower extremity She states that the edema has improved and is stable.   2. Vitamin D deficiency Alicia Lowery is taking her medication as directed.  3. Type 2 diabetes mellitus with complications (HCC) Alicia Lowery is currently taking Glucophage.  4. Hyperlipidemia associated with type 2 diabetes mellitus (HCC) Alicia Lowery is taking Crestor currently.  Assessment/Plan:   1. Edema, lower extremity We will refill Lasix 20 mg every 3 days with no refills.   - furosemide (LASIX) 20 MG tablet; Take 1 tablet (20 mg total) by mouth every 3 (three) days.  Dispense: 10 tablet; Refill: 0  2. Vitamin D deficiency Low Vitamin D level contributes to fatigue and are associated with obesity, breast, and colon cancer. We will refill prescription Vitamin D 50,000 IU every 2 weeks for 1 month with no refills and [place] will follow-up for routine testing of Vitamin D, at least 2-3 times per year to avoid over-replacement. We will check Vitamin D today.   - Vitamin D, Ergocalciferol, (DRISDOL) 1.25 MG (50000 UNIT) CAPS capsule; TAKE 1 CAPSULE ONCE EVERY 2 WEEKS.  Dispense: 2 capsule; Refill: 0 - VITAMIN D 25 Hydroxy (Vit-D Deficiency, Fractures)  3. Type 2 diabetes mellitus with complications (HCC) We will check A1C and Insulin today. Good blood sugar  control is important to decrease the likelihood of diabetic complications such as nephropathy, neuropathy, limb loss, blindness, coronary artery disease, and death. Intensive lifestyle modification including diet, exercise and weight loss are the first line of treatment for diabetes.   - Hemoglobin A1c - Insulin, random  4. Hyperlipidemia associated with type 2 diabetes mellitus (HCC) Cardiovascular risk and specific lipid/LDL goals reviewed.  We discussed several lifestyle modifications today and Alicia Lowery will continue to work on diet, exercise and weight loss efforts. We will check Lipids today. Alicia Lowery will continue taking her medications. Orders and follow up as documented in patient record.   Counseling Intensive lifestyle modifications are the first line treatment for this issue. Dietary changes: Increase soluble fiber. Decrease simple carbohydrates. Exercise changes: Moderate to vigorous-intensity aerobic activity 150 minutes per week if tolerated. Lipid-lowering medications: see documented in medical record. - Lipid Panel With LDL/HDL Ratio  5. Obesity with current BMI of 46.2 Alicia Lowery is currently in the action stage of change. As such, her goal is to continue with weight loss efforts. She has agreed to the Category 3 Plan.   Alicia Lowery will continue meal planning and she will continue intentional eating. She will keep carbohydrates low.   Exercise goals:  As is.  Behavioral modification strategies: increasing lean protein intake, decreasing simple carbohydrates, increasing vegetables, increasing water intake, decreasing eating out, no skipping meals, meal planning and cooking strategies, keeping healthy foods in the home, and planning for success.  Alicia Lowery has agreed to follow-up with our clinic in 4  weeks. She was informed of the importance of frequent follow-up visits to maximize her success with intensive lifestyle modifications for her multiple health conditions.   Alicia Lowery was informed  we would discuss her lab results at her next visit unless there is a critical issue that needs to be addressed sooner. Alicia Lowery agreed to keep her next visit at the agreed upon time to discuss these results.  Objective:   Blood pressure (!) 144/81, pulse 70, temperature 97.9 F (36.6 C), height 5\' 2"  (1.575 m), weight 252 lb (114.3 kg), last menstrual period 08/02/2016, SpO2 98 %. Body mass index is 46.09 kg/m.  General: Cooperative, alert, well developed, in no acute distress. HEENT: Conjunctivae and lids unremarkable. Cardiovascular: Regular rhythm.  Lungs: Normal work of breathing. Neurologic: No focal deficits.   Lab Results  Component Value Date   CREATININE 0.92 03/15/2020   BUN 15 03/15/2020   NA 140 03/15/2020   K 4.0 03/15/2020   CL 101 03/15/2020   CO2 28 03/15/2020   Lab Results  Component Value Date   ALT 25 03/15/2020   AST 20 03/15/2020   ALKPHOS 67 03/15/2020   BILITOT 0.8 03/15/2020   Lab Results  Component Value Date   HGBA1C 6.4 (A) 02/06/2021   HGBA1C 6.9 (A) 08/01/2020   HGBA1C 6.4 (H) 03/15/2020   HGBA1C 6.4 (H) 10/15/2019   HGBA1C 7.1 (A) 06/23/2019   Lab Results  Component Value Date   INSULIN 14.2 03/15/2020   INSULIN 17.6 06/24/2019   Lab Results  Component Value Date   TSH 1.280 06/24/2019   Lab Results  Component Value Date   CHOL 161 03/15/2020   HDL 48 03/15/2020   LDLCALC 96 03/15/2020   TRIG 90 03/15/2020   CHOLHDL 5.4 (H) 06/23/2019   Lab Results  Component Value Date   VD25OH 55.5 03/15/2020   VD25OH 57.6 10/15/2019   VD25OH 14.7 (L) 06/24/2019   Lab Results  Component Value Date   WBC 3.7 06/23/2019   HGB 13.1 06/23/2019   HCT 39.4 06/23/2019   MCV 91 06/23/2019   PLT 233 06/23/2019   No results found for: IRON, TIBC, FERRITIN  Attestation Statements:   Reviewed by clinician on day of visit: allergies, medications, problem list, medical history, surgical history, family history, social history, and previous  encounter notes.  I, Lizbeth Bark, RMA, am acting as Location manager for [employer], DO.  I have reviewed the above documentation for accuracy and completeness, and I agree with the above. Jearld Lesch, DO

## 2021-05-09 ENCOUNTER — Encounter (INDEPENDENT_AMBULATORY_CARE_PROVIDER_SITE_OTHER): Payer: Self-pay | Admitting: Bariatrics

## 2021-05-17 ENCOUNTER — Other Ambulatory Visit (INDEPENDENT_AMBULATORY_CARE_PROVIDER_SITE_OTHER): Payer: Self-pay | Admitting: Bariatrics

## 2021-05-17 DIAGNOSIS — R600 Localized edema: Secondary | ICD-10-CM

## 2021-05-30 ENCOUNTER — Other Ambulatory Visit: Payer: Self-pay

## 2021-05-30 ENCOUNTER — Encounter (INDEPENDENT_AMBULATORY_CARE_PROVIDER_SITE_OTHER): Payer: Self-pay | Admitting: Bariatrics

## 2021-05-30 ENCOUNTER — Ambulatory Visit (INDEPENDENT_AMBULATORY_CARE_PROVIDER_SITE_OTHER): Payer: 59 | Admitting: Bariatrics

## 2021-05-30 VITALS — BP 118/75 | HR 71 | Temp 97.9°F | Ht 62.0 in | Wt 251.0 lb

## 2021-05-30 DIAGNOSIS — E559 Vitamin D deficiency, unspecified: Secondary | ICD-10-CM | POA: Diagnosis not present

## 2021-05-30 DIAGNOSIS — R600 Localized edema: Secondary | ICD-10-CM | POA: Diagnosis not present

## 2021-05-30 DIAGNOSIS — E118 Type 2 diabetes mellitus with unspecified complications: Secondary | ICD-10-CM | POA: Diagnosis not present

## 2021-05-30 DIAGNOSIS — Z6841 Body Mass Index (BMI) 40.0 and over, adult: Secondary | ICD-10-CM

## 2021-05-30 MED ORDER — FUROSEMIDE 20 MG PO TABS: 20.0000 mg | ORAL_TABLET | ORAL | 0 refills | Status: DC

## 2021-05-30 MED ORDER — VITAMIN D (ERGOCALCIFEROL) 1.25 MG (50000 UNIT) PO CAPS: ORAL_CAPSULE | ORAL | 0 refills | Status: DC

## 2021-05-31 ENCOUNTER — Encounter (INDEPENDENT_AMBULATORY_CARE_PROVIDER_SITE_OTHER): Payer: Self-pay | Admitting: Bariatrics

## 2021-05-31 NOTE — Progress Notes (Signed)
Chief Complaint:   OBESITY Alicia Lowery is here to discuss her progress with her obesity treatment plan along with follow-up of her obesity related diagnoses. Alicia Lowery is on the Category 2 Plan and states she is following her eating plan approximately 40% of the time. Alicia Lowery states she is walking for 20 minutes 3 times per week.  Today's visit was #: 74 Starting weight: 246 lbs Starting date: 06/23/2020 Today's weight: 251 lbs Today's date: 05/30/2021 Total lbs lost to date: 0 Total lbs lost since last in-office visit: 1 lb  Interim History: Alicia Lowery is down 1 lb since her last visit.  Subjective:   1. Vitamin D deficiency Alicia Lowery is taking her medication as directed.   2. Edema, lower extremity Alicia Lowery is currently taking Lasix and walking.  3. Type 2 diabetes mellitus with complications (HCC) Alicia Lowery is taking Ozempic and Metformin currently. Her last A1C was 7.2 it has increased from 6.4.  Assessment/Plan:   1. Vitamin D deficiency Low Vitamin D level contributes to fatigue and are associated with obesity, breast, and colon cancer. We will refill prescription Vitamin D 50,000 IU every 2 weeks for 1 month with no refills and Alicia Lowery will follow-up for routine testing of Vitamin D, at least 2-3 times per year to avoid over-replacement.  - Vitamin D, Ergocalciferol, (DRISDOL) 1.25 MG (50000 UNIT) CAPS capsule; TAKE 1 CAPSULE ONCE EVERY 2 WEEKS.  Dispense: 2 capsule; Refill: 0  2. Edema, lower extremity Alicia Lowery will not stand for long periods of time. She will elevate her foot when she can. We will refill Lasix 20 mg for 1 month with no refills.  - furosemide (LASIX) 20 MG tablet; Take 1 tablet (20 mg total) by mouth every 3 (three) days.  Dispense: 10 tablet; Refill: 0  3. Type 2 diabetes mellitus with complications (HCC) Alicia Lowery will continue her medications and she will take consistency. Good blood sugar control is important to decrease the likelihood of diabetic complications such as  nephropathy, neuropathy, limb loss, blindness, coronary artery disease, and death. Intensive lifestyle modification including diet, exercise and weight loss are the first line of treatment for diabetes.   4. Obesity with current BMI of 46.1 Alicia Lowery is currently in the action stage of change. As such, her goal is to continue with weight loss efforts. She has agreed to the Category 2 Plan.   We reviewed labs from 05/02/2021 CMP, Lipids,Vitamin D, globulins, A1C and glucose. She will continue meal planning.  Exercise goals: Alicia Lowery will walk more on the weekends.   Behavioral modification strategies: increasing lean protein intake, decreasing simple carbohydrates, increasing vegetables, increasing water intake, decreasing eating out, no skipping meals, meal planning and cooking strategies, keeping healthy foods in the home, and planning for success.  Alicia Lowery has agreed to follow-up with our clinic in 4 weeks. She was informed of the importance of frequent follow-up visits to maximize her success with intensive lifestyle modifications for her multiple health conditions.   Objective:   Blood pressure 118/75, pulse 71, temperature 97.9 F (36.6 C), height 5\' 2"  (1.575 m), weight 251 lb (113.9 kg), last menstrual period 08/02/2016, SpO2 97 %. Body mass index is 45.91 kg/m.  General: Cooperative, alert, well developed, in no acute distress. HEENT: Conjunctivae and lids unremarkable. Cardiovascular: Regular rhythm.  Lungs: Normal work of breathing. Neurologic: No focal deficits.   Lab Results  Component Value Date   CREATININE 0.82 05/02/2021   BUN 14 05/02/2021   NA 141 05/02/2021   K 3.8 05/02/2021  CL 101 05/02/2021   CO2 26 05/02/2021   Lab Results  Component Value Date   ALT 22 05/02/2021   AST 21 05/02/2021   ALKPHOS 72 05/02/2021   BILITOT 0.7 05/02/2021   Lab Results  Component Value Date   HGBA1C 7.2 (H) 05/02/2021   HGBA1C 6.4 (A) 02/06/2021   HGBA1C 6.9 (A) 08/01/2020    HGBA1C 6.4 (H) 03/15/2020   HGBA1C 6.4 (H) 10/15/2019   Lab Results  Component Value Date   INSULIN 13.8 05/02/2021   INSULIN 14.2 03/15/2020   INSULIN 17.6 06/24/2019   Lab Results  Component Value Date   TSH 1.280 06/24/2019   Lab Results  Component Value Date   CHOL 161 05/02/2021   HDL 48 05/02/2021   LDLCALC 93 05/02/2021   TRIG 110 05/02/2021   CHOLHDL 5.4 (H) 06/23/2019   Lab Results  Component Value Date   VD25OH 42.2 05/02/2021   VD25OH 55.5 03/15/2020   VD25OH 57.6 10/15/2019   Lab Results  Component Value Date   WBC 3.7 06/23/2019   HGB 13.1 06/23/2019   HCT 39.4 06/23/2019   MCV 91 06/23/2019   PLT 233 06/23/2019   No results found for: IRON, TIBC, FERRITIN  Attestation Statements:   Reviewed by clinician on day of visit: allergies, medications, problem list, medical history, surgical history, family history, social history, and previous encounter notes.  I, Lizbeth Bark, RMA, am acting as Location manager for [employer], DO.  I have reviewed the above documentation for accuracy and completeness, and I agree with the above. Jearld Lesch, DO

## 2021-06-02 ENCOUNTER — Other Ambulatory Visit: Payer: Self-pay | Admitting: Family Medicine

## 2021-06-02 DIAGNOSIS — E119 Type 2 diabetes mellitus without complications: Secondary | ICD-10-CM

## 2021-06-08 ENCOUNTER — Other Ambulatory Visit: Payer: Self-pay

## 2021-06-08 ENCOUNTER — Encounter: Payer: Self-pay | Admitting: Family Medicine

## 2021-06-08 ENCOUNTER — Ambulatory Visit: Payer: 59 | Admitting: Family Medicine

## 2021-06-08 VITALS — BP 132/76 | HR 68 | Temp 97.2°F | Wt 259.6 lb

## 2021-06-08 DIAGNOSIS — E118 Type 2 diabetes mellitus with unspecified complications: Secondary | ICD-10-CM

## 2021-06-08 DIAGNOSIS — E785 Hyperlipidemia, unspecified: Secondary | ICD-10-CM

## 2021-06-08 DIAGNOSIS — E1169 Type 2 diabetes mellitus with other specified complication: Secondary | ICD-10-CM

## 2021-06-08 DIAGNOSIS — E1159 Type 2 diabetes mellitus with other circulatory complications: Secondary | ICD-10-CM

## 2021-06-08 DIAGNOSIS — R600 Localized edema: Secondary | ICD-10-CM

## 2021-06-08 DIAGNOSIS — I152 Hypertension secondary to endocrine disorders: Secondary | ICD-10-CM

## 2021-06-08 LAB — POCT GLYCOSYLATED HEMOGLOBIN (HGB A1C): Hemoglobin A1C: 7.2 % — AB (ref 4.0–5.6)

## 2021-06-08 NOTE — Patient Instructions (Signed)
Talk to [employer] about increasing your Ozempic.  Walk every day.  Keep working on [employer]

## 2021-06-08 NOTE — Addendum Note (Signed)
Addended by: Renelda Loma on: 06/08/2021 03:22 PM   Modules accepted: Orders

## 2021-06-08 NOTE — Progress Notes (Signed)
°  Subjective:    Patient ID: Alicia Lowery, female    DOB: [birth year], 61 y.o.   MRN: [id number]  Alicia Lowery is a 61 y.o. female who presents for follow-up of Type 2 diabetes mellitus.  Home blood sugar records:  two hours post meal lowest 115 highest 150 Current symptoms/problems include none and have been stable. Daily foot checks:yes   Any foot concerns: none Exercise:  staying active / with exercise states that she walks 3 or 4 days a week. Diet: fair  She continues to go to medical weight loss and wellness.  Review of the record indicates that she has actually gained weight since she started.  She continues on metformin and Ozempic.  She alluded to the fact that she was not taking her metformin on a regular basis but based on her eating rather than daily.  She is taking Crestor without difficulty.  Also lisinopril/HCTZ and occasional use of furosemide. The following portions of the patient's history were reviewed and updated as appropriate: allergies, current medications, past medical history, past social history and problem list.  ROS as in subjective above.     Objective:    Physical Exam Alert and in no distress otherwise not examined.  Blood pressure 132/76, pulse 68, temperature (!) 97.2 F (36.2 C), weight 259 lb 9.6 oz (117.8 kg), last menstrual period 08/02/2016, SpO2 96 %. Hemoglobin A1c is 7.2 Lab Review Diabetic Labs Latest Ref Rng & Units 05/02/2021 02/06/2021 08/01/2020 03/15/2020 10/15/2019  HbA1c 4.8 - 5.6 % 7.2(H) 6.4(A) 6.9(A) 6.4(H) 6.4(H)  Microalbumin mg/L - - - - -  Micro/Creat Ratio - - - - - -  Chol 100 - 199 mg/dL 774 - - 128 -  HDL >78 mg/dL 48 - - 48 -  Calc LDL 0 - 99 mg/dL 93 - - 96 -  Triglycerides 0 - 149 mg/dL 676 - - 90 -  Creatinine 0.57 - 1.00 mg/dL 7.20 - - 9.47 -   BP/Weight 06/08/2021 05/30/2021 05/02/2021 03/14/2021 02/14/2021  Systolic BP 132 118 144 121 134  Diastolic BP 76 75 81 82 83  Wt. (Lbs) 259.6 251 252 249 249  BMI 47.48 45.91 46.09  45.54 45.54   Foot/eye exam completion dates Latest Ref Rng & Units 02/06/2021 12/10/2019  Eye Exam No Retinopathy - -  Foot Form Completion - Done Done    Skya  reports that she has never smoked. She has never used smokeless tobacco. She reports current alcohol use. She reports that she does not use drugs.     Assessment & Plan:    Type 2 diabetes mellitus with complications (HCC)  Edema, lower extremity  Hyperlipidemia associated with type 2 diabetes mellitus (HCC)  Obesity, morbid (HCC)  Hypertension associated with diabetes (HCC)  Rx changes: none Education: Reviewed ABCs of diabetes management (respective goals in parentheses):  A1C (<7), blood pressure (<130/80), and cholesterol (LDL <100). Compliance at present is estimated to be poor. Efforts to improve compliance (if necessary) will be directed at increased exercise. Follow up: 6 months I expressed to her that it did not want to consider going to the medical weight loss and wellness was during her much good.  Try to impress upon her the need for her to take better care of herself.

## 2021-06-22 ENCOUNTER — Other Ambulatory Visit (INDEPENDENT_AMBULATORY_CARE_PROVIDER_SITE_OTHER): Payer: Self-pay | Admitting: Bariatrics

## 2021-06-22 DIAGNOSIS — E559 Vitamin D deficiency, unspecified: Secondary | ICD-10-CM

## 2021-07-01 ENCOUNTER — Other Ambulatory Visit (INDEPENDENT_AMBULATORY_CARE_PROVIDER_SITE_OTHER): Payer: Self-pay | Admitting: Bariatrics

## 2021-07-01 DIAGNOSIS — R600 Localized edema: Secondary | ICD-10-CM

## 2021-07-03 NOTE — Telephone Encounter (Signed)
LAST APPOINTMENT DATE: 05/30/21 NEXT APPOINTMENT DATE: 07/10/21   [id number] - [place], [place] - [address] [place] [address] Phone: [phone] Fax: [phone]  Patient is requesting a refill of the following medications: Pending Prescriptions:                       Disp   Refills   furosemide (LASIX) 20 MG tablet [Pharmacy *10 tab*0       Sig: TAKE 1 TABLET BY MOUTH EVERY 3 DAYS   Date last filled: 05/30/21 Previously prescribed by Dr. Manson Passey  Lab Results      Component                Value               Date                      HGBA1C                   7.2 (A)             06/08/2021                HGBA1C                   7.2 (H)             05/02/2021                HGBA1C                   6.4 (A)             02/06/2021           Lab Results      Component                Value               Date                      MICROALBUR               <5.0                06/23/2019                LDLCALC                  93                  05/02/2021                CREATININE               0.82                05/02/2021           Lab Results      Component                Value               Date                      VD25OH                   42.2  05/02/2021                VD25OH                   55.5                03/15/2020                VD25OH                   57.6                10/15/2019            BP Readings from Last 3 Encounters: 06/08/21 : 132/76 05/30/21 : 118/75 05/02/21 : (!) 144/81

## 2021-07-10 ENCOUNTER — Other Ambulatory Visit: Payer: Self-pay

## 2021-07-10 ENCOUNTER — Ambulatory Visit (INDEPENDENT_AMBULATORY_CARE_PROVIDER_SITE_OTHER): Payer: 59 | Admitting: Bariatrics

## 2021-07-10 ENCOUNTER — Encounter (INDEPENDENT_AMBULATORY_CARE_PROVIDER_SITE_OTHER): Payer: Self-pay | Admitting: Bariatrics

## 2021-07-10 VITALS — BP 117/83 | HR 71 | Temp 97.4°F | Ht 62.0 in | Wt 244.0 lb

## 2021-07-10 DIAGNOSIS — E559 Vitamin D deficiency, unspecified: Secondary | ICD-10-CM

## 2021-07-10 DIAGNOSIS — Z6841 Body Mass Index (BMI) 40.0 and over, adult: Secondary | ICD-10-CM

## 2021-07-10 DIAGNOSIS — E785 Hyperlipidemia, unspecified: Secondary | ICD-10-CM | POA: Diagnosis not present

## 2021-07-10 DIAGNOSIS — E1169 Type 2 diabetes mellitus with other specified complication: Secondary | ICD-10-CM

## 2021-07-10 DIAGNOSIS — E669 Obesity, unspecified: Secondary | ICD-10-CM | POA: Diagnosis not present

## 2021-07-10 DIAGNOSIS — E66813 Obesity, class 3: Secondary | ICD-10-CM

## 2021-07-10 MED ORDER — VITAMIN D (ERGOCALCIFEROL) 1.25 MG (50000 UNIT) PO CAPS: ORAL_CAPSULE | ORAL | 0 refills | Status: DC

## 2021-07-10 NOTE — Progress Notes (Signed)
Chief Complaint:   OBESITY Alicia Lowery is here to discuss her progress with her obesity treatment plan along with follow-up of her obesity related diagnoses. Alicia Lowery is on the Category 2 Plan and states she is following her eating plan approximately 80% of the time. Alicia Lowery states she is playing tennis for 60 minutes 1 time per week and walking for 30 minutes 4 times per week.  Today's visit was #: 86 Starting weight: 246 lbs Starting date: 06/23/2020 Today's weight: 244 lbs Today's date: 07/10/2021 Total lbs lost to date: 2 lbs Total lbs lost since last in-office visit: 7 lbs  Interim History: Alicia Lowery is down 7 lbs since her last visit. She is eating fewer carbohydrates.   Subjective:   1. Vitamin D deficiency Alicia Lowery notes fatigue.   2. Hyperlipidemia associated with type 2 diabetes mellitus (Alicia Lowery) Alicia Lowery is currently taking Crestor. She denies myalgia.   Assessment/Plan:   1. Vitamin D deficiency Low Vitamin D level contributes to fatigue and are associated with obesity, breast, and colon cancer. We will refill prescription Vitamin D 50,000 IU every 2 weeks for 1 month with no refills and Alicia Lowery will follow-up for routine testing of Vitamin D, at least 2-3 times per year to avoid over-replacement.  - Vitamin D, Ergocalciferol, (DRISDOL) 1.25 MG (50000 UNIT) CAPS capsule; TAKE 1 CAPSULE ONCE EVERY 2 WEEKS.  Dispense: 2 capsule; Refill: 0  2. Hyperlipidemia associated with type 2 diabetes mellitus (Alicia Lowery) Cardiovascular risk and specific lipid/LDL goals reviewed.  Alicia Lowery will continue taking Crestor. We discussed several lifestyle modifications today and Alicia Lowery will continue to work on diet, exercise and weight loss efforts. Orders and follow up as documented in patient record.   Counseling Intensive lifestyle modifications are the first line treatment for this issue. Dietary changes: Increase soluble fiber. Decrease simple carbohydrates. Exercise changes: Moderate to  vigorous-intensity aerobic activity 150 minutes per week if tolerated. Lipid-lowering medications: see documented in medical record.  3. Obesity with current BMI of 44.7 Alicia Lowery is currently in the action stage of change. As such, her goal is to continue with weight loss efforts. She has agreed to the Category 2 Plan.   Alicia Lowery will continue meal planning and she will continue intentional eating.  Exercise goals:  Alicia Lowery started a tennis class.  Behavioral modification strategies: increasing lean protein intake, decreasing simple carbohydrates, increasing vegetables, increasing water intake, decreasing eating out, no skipping meals, meal planning and cooking strategies, keeping healthy foods in the home, and planning for success.  Alicia Lowery has agreed to follow-up with our clinic in 3-4 weeks. She was informed of the importance of frequent follow-up visits to maximize her success with intensive lifestyle modifications for her multiple health conditions.   Objective:   Blood pressure 117/83, pulse 71, temperature (!) 97.4 F (36.3 C), height 5\' 2"  (1.575 m), weight 244 lb (110.7 kg), last menstrual period 08/02/2016, SpO2 95 %. Body mass index is 44.63 kg/m.  General: Cooperative, alert, well developed, in no acute distress. HEENT: Conjunctivae and lids unremarkable. Cardiovascular: Regular rhythm.  Lungs: Normal work of breathing. Neurologic: No focal deficits.   Lab Results  Component Value Date   CREATININE 0.82 05/02/2021   BUN 14 05/02/2021   NA 141 05/02/2021   K 3.8 05/02/2021   CL 101 05/02/2021   CO2 26 05/02/2021   Lab Results  Component Value Date   ALT 22 05/02/2021   AST 21 05/02/2021   ALKPHOS 72 05/02/2021   BILITOT 0.7 05/02/2021   Lab  Results  Component Value Date   HGBA1C 7.2 (A) 06/08/2021   HGBA1C 7.2 (H) 05/02/2021   HGBA1C 6.4 (A) 02/06/2021   HGBA1C 6.9 (A) 08/01/2020   HGBA1C 6.4 (H) 03/15/2020   Lab Results  Component Value Date   INSULIN 13.8  05/02/2021   INSULIN 14.2 03/15/2020   INSULIN 17.6 06/24/2019   Lab Results  Component Value Date   TSH 1.280 06/24/2019   Lab Results  Component Value Date   CHOL 161 05/02/2021   HDL 48 05/02/2021   LDLCALC 93 05/02/2021   TRIG 110 05/02/2021   CHOLHDL 5.4 (H) 06/23/2019   Lab Results  Component Value Date   VD25OH 42.2 05/02/2021   VD25OH 55.5 03/15/2020   VD25OH 57.6 10/15/2019   Lab Results  Component Value Date   WBC 3.7 06/23/2019   HGB 13.1 06/23/2019   HCT 39.4 06/23/2019   MCV 91 06/23/2019   PLT 233 06/23/2019   No results found for: IRON, TIBC, FERRITIN  Attestation Statements:   Reviewed by clinician on day of visit: allergies, medications, problem list, medical history, surgical history, family history, social history, and previous encounter notes.  I, Lizbeth Bark, RMA, am acting as Location manager for [employer], DO.  I have reviewed the above documentation for accuracy and completeness, and I agree with the above. Jearld Lesch, DO

## 2021-07-22 ENCOUNTER — Other Ambulatory Visit (INDEPENDENT_AMBULATORY_CARE_PROVIDER_SITE_OTHER): Payer: Self-pay | Admitting: Bariatrics

## 2021-07-22 DIAGNOSIS — E559 Vitamin D deficiency, unspecified: Secondary | ICD-10-CM

## 2021-07-24 NOTE — Telephone Encounter (Signed)
Dr.Brown 

## 2021-08-07 ENCOUNTER — Other Ambulatory Visit: Payer: Self-pay

## 2021-08-07 ENCOUNTER — Ambulatory Visit (INDEPENDENT_AMBULATORY_CARE_PROVIDER_SITE_OTHER): Payer: 59 | Admitting: Bariatrics

## 2021-08-07 ENCOUNTER — Encounter (INDEPENDENT_AMBULATORY_CARE_PROVIDER_SITE_OTHER): Payer: Self-pay | Admitting: Bariatrics

## 2021-08-07 VITALS — BP 117/68 | HR 80 | Temp 97.8°F | Ht 62.0 in | Wt 245.0 lb

## 2021-08-07 DIAGNOSIS — Z6841 Body Mass Index (BMI) 40.0 and over, adult: Secondary | ICD-10-CM

## 2021-08-07 DIAGNOSIS — E559 Vitamin D deficiency, unspecified: Secondary | ICD-10-CM

## 2021-08-07 DIAGNOSIS — E669 Obesity, unspecified: Secondary | ICD-10-CM | POA: Diagnosis not present

## 2021-08-07 DIAGNOSIS — I152 Hypertension secondary to endocrine disorders: Secondary | ICD-10-CM | POA: Diagnosis not present

## 2021-08-07 DIAGNOSIS — E1159 Type 2 diabetes mellitus with other circulatory complications: Secondary | ICD-10-CM

## 2021-08-07 MED ORDER — VITAMIN D (ERGOCALCIFEROL) 1.25 MG (50000 UNIT) PO CAPS: ORAL_CAPSULE | ORAL | 0 refills | Status: DC

## 2021-08-08 ENCOUNTER — Encounter (INDEPENDENT_AMBULATORY_CARE_PROVIDER_SITE_OTHER): Payer: Self-pay | Admitting: Bariatrics

## 2021-08-08 NOTE — Progress Notes (Signed)
? ? ? ?Chief Complaint:  ? ?OBESITY ?Alicia Lowery is here to discuss her progress with her obesity treatment plan along with follow-up of her obesity related diagnoses. Alicia Lowery is on the Category 2 Plan and states she is following her eating plan approximately 80% of the time. Alicia Lowery states she is walking for 25 minutes 4 times per week. ? ?Today's visit was #: 74 ?Starting weight: 246 lbs ?Starting date: 06/23/2020 ?Today's weight: 245 lbs ?Today's date: 08/07/2021 ?Total lbs lost to date: 1 lb ?Total lbs lost since last in-office visit: 0 ? ?Interim History: Alicia Lowery is up 1 lb since her last visit.  ? ?Subjective:  ? ?1. Vitamin D deficiency ?Alicia Lowery is currently taking Vitamin D every 2 weeks.  ? ?2. Hypertension associated with diabetes ([place]) ?Alicia Lowery is taking Zestoretic currently.  ? ?Assessment/Plan:  ? ?1. Vitamin D deficiency ?Low Vitamin D level contributes to fatigue and are associated with obesity, breast, and colon cancer. We will refill prescription Vitamin D 50,000 IU every week for 1 month with no refills and Lailie will follow-up for routine testing of Vitamin D, at least 2-3 times per year to avoid over-replacement. ? ?- Vitamin D, Ergocalciferol, (DRISDOL) 1.25 MG (50000 UNIT) CAPS capsule; TAKE 1 CAPSULE ONCE EVERY 2 WEEKS.  Dispense: 2 capsule; Refill: 0 ? ?2. Hypertension associated with diabetes ([place]) ?Alicia Lowery will continue Zestoretic. She will read her labels. She is working on healthy weight loss and exercise to improve blood pressure control. We will watch for signs of hypotension as she continues her lifestyle modifications. ? ?3. Obesity with current BMI of 44.9 ?Alicia Lowery is currently in the action stage of change. As such, her goal is to continue with weight loss efforts. She has agreed to the Category 2 Plan and keeping a food journal and adhering to recommended goals of 1200 calories and 80 grams of protein.  ? ?Alicia Lowery will continue meal planning and she will continue intentional eating.   ? ?Exercise goals:  Alicia Lowery will begin tennis. She will continue walking and doing yard work.  ? ?Behavioral modification strategies: increasing lean protein intake, decreasing simple carbohydrates, increasing vegetables, increasing water intake, decreasing eating out, no skipping meals, meal planning and cooking strategies, keeping healthy foods in the home, and planning for success. ? ?Alicia Lowery has agreed to follow-up with our clinic in 4 weeks. She was informed of the importance of frequent follow-up visits to maximize her success with intensive lifestyle modifications for her multiple health conditions.  ? ?Objective:  ? ?Blood pressure 117/68, pulse 80, temperature 97.8 ?F (36.6 ?C), height 5\' 2"  (1.575 m), weight 245 lb (111.1 kg), last menstrual period 08/02/2016, SpO2 95 %. ?Body mass index is 44.81 kg/m?. ? ?General: Cooperative, alert, well developed, in no acute distress. ?HEENT: Conjunctivae and lids unremarkable. ?Cardiovascular: Regular rhythm.  ?Lungs: Normal work of breathing. ?Neurologic: No focal deficits.  ? ?Lab Results  ?Component Value Date  ? CREATININE 0.82 05/02/2021  ? BUN 14 05/02/2021  ? NA 141 05/02/2021  ? K 3.8 05/02/2021  ? CL 101 05/02/2021  ? CO2 26 05/02/2021  ? ?Lab Results  ?Component Value Date  ? ALT 22 05/02/2021  ? AST 21 05/02/2021  ? ALKPHOS 72 05/02/2021  ? BILITOT 0.7 05/02/2021  ? ?Lab Results  ?Component Value Date  ? HGBA1C 7.2 (A) 06/08/2021  ? HGBA1C 7.2 (H) 05/02/2021  ? HGBA1C 6.4 (A) 02/06/2021  ? HGBA1C 6.9 (A) 08/01/2020  ? HGBA1C 6.4 (H) 03/15/2020  ? ?Lab Results  ?Component  Value Date  ? INSULIN 13.8 05/02/2021  ? INSULIN 14.2 03/15/2020  ? INSULIN 17.6 06/24/2019  ? ?Lab Results  ?Component Value Date  ? TSH 1.280 06/24/2019  ? ?Lab Results  ?Component Value Date  ? CHOL 161 05/02/2021  ? HDL 48 05/02/2021  ? [place] 93 05/02/2021  ? TRIG 110 05/02/2021  ? CHOLHDL 5.4 (H) 06/23/2019  ? ?Lab Results  ?Component Value Date  ? VD25OH 42.2 05/02/2021  ? VD25OH  55.5 03/15/2020  ? VD25OH 57.6 10/15/2019  ? ?Lab Results  ?Component Value Date  ? WBC 3.7 06/23/2019  ? HGB 13.1 06/23/2019  ? HCT 39.4 06/23/2019  ? MCV 91 06/23/2019  ? PLT 233 06/23/2019  ? ?No results found for: IRON, TIBC, FERRITIN ? ?Attestation Statements:  ? ?Reviewed by clinician on day of visit: allergies, medications, problem list, medical history, surgical history, family history, social history, and previous encounter notes. ? ?I, Alicia Lowery, RMA, am acting as transcriptionist for [employer], DO. ? ?I have reviewed the above documentation for accuracy and completeness, and I agree with the above. Jearld Lesch, DO ? ?

## 2021-08-15 ENCOUNTER — Other Ambulatory Visit: Payer: Self-pay | Admitting: Family Medicine

## 2021-08-15 DIAGNOSIS — E119 Type 2 diabetes mellitus without complications: Secondary | ICD-10-CM

## 2021-08-21 ENCOUNTER — Other Ambulatory Visit (INDEPENDENT_AMBULATORY_CARE_PROVIDER_SITE_OTHER): Payer: Self-pay | Admitting: Bariatrics

## 2021-08-21 DIAGNOSIS — E559 Vitamin D deficiency, unspecified: Secondary | ICD-10-CM

## 2021-09-06 ENCOUNTER — Encounter (INDEPENDENT_AMBULATORY_CARE_PROVIDER_SITE_OTHER): Payer: Self-pay | Admitting: Bariatrics

## 2021-09-06 ENCOUNTER — Ambulatory Visit (INDEPENDENT_AMBULATORY_CARE_PROVIDER_SITE_OTHER): Payer: 59 | Admitting: Bariatrics

## 2021-09-06 ENCOUNTER — Other Ambulatory Visit: Payer: Self-pay | Admitting: Family Medicine

## 2021-09-06 VITALS — BP 124/78 | HR 72 | Temp 98.1°F | Ht 62.0 in | Wt 246.0 lb

## 2021-09-06 DIAGNOSIS — E119 Type 2 diabetes mellitus without complications: Secondary | ICD-10-CM

## 2021-09-06 DIAGNOSIS — E559 Vitamin D deficiency, unspecified: Secondary | ICD-10-CM

## 2021-09-06 DIAGNOSIS — Z6841 Body Mass Index (BMI) 40.0 and over, adult: Secondary | ICD-10-CM | POA: Diagnosis not present

## 2021-09-06 DIAGNOSIS — E669 Obesity, unspecified: Secondary | ICD-10-CM

## 2021-09-06 DIAGNOSIS — Z7984 Long term (current) use of oral hypoglycemic drugs: Secondary | ICD-10-CM

## 2021-09-06 DIAGNOSIS — E1169 Type 2 diabetes mellitus with other specified complication: Secondary | ICD-10-CM | POA: Diagnosis not present

## 2021-09-06 MED ORDER — VITAMIN D (ERGOCALCIFEROL) 1.25 MG (50000 UNIT) PO CAPS: ORAL_CAPSULE | ORAL | 0 refills | Status: DC

## 2021-09-14 NOTE — Progress Notes (Signed)
? ? ? ?Chief Complaint:  ? ?OBESITY ?Alicia Lowery is here to discuss her progress with her obesity treatment plan along with follow-up of her obesity related diagnoses. Alicia Lowery is on the Category 2 Plan and states she is following her eating plan approximately 75% of the time. Alicia Lowery states she is walking 25-30 minutes 3 times per week. ? ?Today's visit was #: 83 ?Starting weight: 246 lbs ?Starting date: 06/23/2020 ?Today's weight: 246 lbs ?Today's date: 09/06/2021 ?Total lbs lost to date: 0 lbs ?Total lbs lost since last in-office visit: 0 lbs ? ?Interim History: Alicia Lowery is up 1 lb since her last visit. She is doing well with her walking. ? ?Subjective:  ? ?1. Vitamin D deficiency ?She is currently taking prescription vitamin D 50,000 IU each week.  ? ?2. Type 2 diabetes mellitus with other specified complication, without long-term current use of insulin ([place]) ?Alicia Lowery is currently taking Metformin 500 mg twice daily. She is taking Ozempic per her PCP. ? ? ?Assessment/Plan:  ? ?1. Vitamin D deficiency ?Low Vitamin D level contributes to fatigue and are associated with obesity, breast, and colon cancer. She agrees to continue to take prescription Vitamin D @50 ,000 IU every week and will follow-up for routine testing of Vitamin D, at least 2-3 times per year to avoid over-replacement. ?- Vitamin D, Ergocalciferol, (DRISDOL) 1.25 MG (50000 UNIT) CAPS capsule; TAKE 1 CAPSULE ONCE EVERY 2 WEEKS.  Dispense: 4 capsule; Refill: 0 ? ?2. Type 2 diabetes mellitus with other specified complication, without long-term current use of insulin ([place]) ?Alicia Lowery will continue Metformin and Ozempic. ?3. Obesity, Current BMI 45.0 ?Alicia Lowery is currently in the action stage of change. As such, her goal is to continue with weight loss efforts. She has agreed to the Category 2 Plan.  ? ?Alicia Lowery agreed to meal planning. ?Alicia Lowery will avoid temptation. She will adhere to the plan 85-90 %. ? ?Exercise goals: Alicia Lowery will continue walking. ? ?Behavioral modification strategies:  increasing lean protein intake, decreasing simple carbohydrates, increasing vegetables, increasing water intake, decreasing eating out, no skipping meals, meal planning and cooking strategies, and keeping healthy foods in the home. ? ?Alicia Lowery has agreed to follow-up with our clinic in 5 weeks. She was informed of the importance of frequent follow-up visits to maximize her success with intensive lifestyle modifications for her multiple health conditions.  ? ?Objective:  ? ?Blood pressure 124/78, pulse 72, temperature 98.1 ?F (36.7 ?C), height 5\' 2"  (1.575 m), weight 246 lb (111.6 kg), last menstrual period 08/02/2016, SpO2 91 %. ?Body mass index is 44.99 kg/m?. ? ?General: Cooperative, alert, well developed, in no acute distress. ?HEENT: Conjunctivae and lids unremarkable. ?Cardiovascular: Regular rhythm.  ?Lungs: Normal work of breathing. ?Neurologic: No focal deficits.  ? ?Lab Results  ?Component Value Date  ? CREATININE 0.82 05/02/2021  ? BUN 14 05/02/2021  ? NA 141 05/02/2021  ? K 3.8 05/02/2021  ? CL 101 05/02/2021  ? CO2 26 05/02/2021  ? ?Lab Results  ?Component Value Date  ? ALT 22 05/02/2021  ? AST 21 05/02/2021  ? ALKPHOS 72 05/02/2021  ? BILITOT 0.7 05/02/2021  ? ?Lab Results  ?Component Value Date  ? HGBA1C 7.2 (A) 06/08/2021  ? HGBA1C 7.2 (H) 05/02/2021  ? HGBA1C 6.4 (A) 02/06/2021  ? HGBA1C 6.9 (A) 08/01/2020  ? HGBA1C 6.4 (H) 03/15/2020  ? ?Lab Results  ?Component Value Date  ? INSULIN 13.8 05/02/2021  ? INSULIN 14.2 03/15/2020  ? INSULIN 17.6 06/24/2019  ? ?Lab Results  ?Component Value Date  ?  TSH 1.280 06/24/2019  ? ?Lab Results  ?Component Value Date  ? CHOL 161 05/02/2021  ? HDL 48 05/02/2021  ? [place] 93 05/02/2021  ? TRIG 110 05/02/2021  ? CHOLHDL 5.4 (H) 06/23/2019  ? ?Lab Results  ?Component Value Date  ? VD25OH 42.2 05/02/2021  ? VD25OH 55.5 03/15/2020  ? VD25OH 57.6 10/15/2019  ? ?Lab Results  ?Component Value Date  ? WBC 3.7 06/23/2019  ? HGB 13.1 06/23/2019  ? HCT 39.4 06/23/2019  ? MCV 91  06/23/2019  ? PLT 233 06/23/2019  ? ?No results found for: IRON, TIBC, FERRITIN ? ?Attestation Statements:  ? ?Reviewed by clinician on day of visit: allergies, medications, problem list, medical history, surgical history, family history, social history, and previous encounter notes. ? ?I, Lennette Bihari, am acting as Location manager for [employer], DO. ? ?I have reviewed the above documentation for accuracy and completeness, and I agree with the above. Jearld Lesch, DO ? ?

## 2021-09-19 ENCOUNTER — Other Ambulatory Visit: Payer: Self-pay | Admitting: Family Medicine

## 2021-09-19 DIAGNOSIS — E119 Type 2 diabetes mellitus without complications: Secondary | ICD-10-CM

## 2021-10-03 ENCOUNTER — Telehealth: Payer: Self-pay

## 2021-10-03 NOTE — Telephone Encounter (Signed)
Pt. Called stating she sent in a refill request for Ozempic but she was not sure if she was still supposed to be on the Ozempic or not.

## 2021-10-04 ENCOUNTER — Encounter (INDEPENDENT_AMBULATORY_CARE_PROVIDER_SITE_OTHER): Payer: Self-pay | Admitting: Bariatrics

## 2021-10-09 ENCOUNTER — Other Ambulatory Visit (INDEPENDENT_AMBULATORY_CARE_PROVIDER_SITE_OTHER): Payer: Self-pay | Admitting: Bariatrics

## 2021-10-09 DIAGNOSIS — E559 Vitamin D deficiency, unspecified: Secondary | ICD-10-CM

## 2021-10-16 ENCOUNTER — Ambulatory Visit (INDEPENDENT_AMBULATORY_CARE_PROVIDER_SITE_OTHER): Payer: 59 | Admitting: Bariatrics

## 2021-10-16 ENCOUNTER — Encounter (INDEPENDENT_AMBULATORY_CARE_PROVIDER_SITE_OTHER): Payer: Self-pay | Admitting: Bariatrics

## 2021-10-16 VITALS — BP 132/84 | HR 74 | Temp 97.7°F | Ht 62.0 in | Wt 250.0 lb

## 2021-10-16 DIAGNOSIS — Z6841 Body Mass Index (BMI) 40.0 and over, adult: Secondary | ICD-10-CM | POA: Diagnosis not present

## 2021-10-16 DIAGNOSIS — E669 Obesity, unspecified: Secondary | ICD-10-CM | POA: Diagnosis not present

## 2021-10-16 DIAGNOSIS — E559 Vitamin D deficiency, unspecified: Secondary | ICD-10-CM | POA: Diagnosis not present

## 2021-10-16 DIAGNOSIS — R600 Localized edema: Secondary | ICD-10-CM

## 2021-10-16 MED ORDER — VITAMIN D (ERGOCALCIFEROL) 1.25 MG (50000 UNIT) PO CAPS: ORAL_CAPSULE | ORAL | 0 refills | Status: DC

## 2021-10-16 MED ORDER — FUROSEMIDE 20 MG PO TABS: ORAL_TABLET | ORAL | 0 refills | Status: DC

## 2021-10-17 NOTE — Progress Notes (Signed)
Chief Complaint:   OBESITY Alicia Lowery is here to discuss her progress with her obesity treatment plan along with follow-up of her obesity related diagnoses. Alicia Lowery is on the Category 2 Plan and states she is following her eating plan approximately 72% of the time. Alicia Lowery states she is walking for 20 minutes 2-3 times per week.  Today's visit was #: 62 Starting weight: 246 lbs Starting date: 06/23/2020 Today's weight: 250 lbs Today's date: 10/16/2021 Total lbs lost to date: 0 Total lbs lost since last in-office visit: 0  Interim History: Alicia Lowery is up 4 lbs since her last visit.  Subjective:   1. Vitamin D deficiency Alicia Lowery is taking Vitamin D as directed.   2. Edema, lower extremity Alicia Lowery states Lasix helps with lower leg edema.   Assessment/Plan:   1. Vitamin D deficiency Low Vitamin D level contributes to fatigue and are associated with obesity, breast, and colon cancer. We will refill prescription Vitamin D 50,000 IU every week for 1 month with no refills and Presious will follow-up for routine testing of Vitamin D, at least 2-3 times per year to avoid over-replacement.  - Vitamin D, Ergocalciferol, (DRISDOL) 1.25 MG (50000 UNIT) CAPS capsule; TAKE 1 CAPSULE ONCE EVERY 2 WEEKS.  Dispense: 4 capsule; Refill: 0  2. Edema, lower extremity We will refill Lasix 20 mg with no refills.   - furosemide (LASIX) 20 MG tablet; TAKE 1 TABLET BY MOUTH EVERY 3 DAYS  Dispense: 10 tablet; Refill: 0  3. Obesity, Current BMI 45.8 Alicia Lowery is currently in the action stage of change. As such, her goal is to continue with weight loss efforts. She has agreed to keeping a food journal and adhering to recommended goals of 1200 calories and 80 grams of protein.   Alicia Lowery will continue meal planning. She will follow the plan 80-90%.   Exercise goals:  Alicia Lowery will walk more.   Behavioral modification strategies: increasing lean protein intake, decreasing simple carbohydrates, increasing vegetables,  increasing water intake, decreasing eating out, no skipping meals, meal planning and cooking strategies, keeping healthy foods in the home, and planning for success.  Alicia Lowery has agreed to follow-up with our clinic in 4 weeks. She was informed of the importance of frequent follow-up visits to maximize her success with intensive lifestyle modifications for her multiple health conditions.   Objective:   Blood pressure 132/84, pulse 74, temperature 97.7 F (36.5 C), height 5\' 2"  (1.575 m), weight 250 lb (113.4 kg), last menstrual period 08/02/2016, SpO2 97 %. Body mass index is 45.73 kg/m.  General: Cooperative, alert, well developed, in no acute distress. HEENT: Conjunctivae and lids unremarkable. Cardiovascular: Regular rhythm.  Lungs: Normal work of breathing. Neurologic: No focal deficits.   Lab Results  Component Value Date   CREATININE 0.82 05/02/2021   BUN 14 05/02/2021   NA 141 05/02/2021   K 3.8 05/02/2021   CL 101 05/02/2021   CO2 26 05/02/2021   Lab Results  Component Value Date   ALT 22 05/02/2021   AST 21 05/02/2021   ALKPHOS 72 05/02/2021   BILITOT 0.7 05/02/2021   Lab Results  Component Value Date   HGBA1C 7.2 (A) 06/08/2021   HGBA1C 7.2 (H) 05/02/2021   HGBA1C 6.4 (A) 02/06/2021   HGBA1C 6.9 (A) 08/01/2020   HGBA1C 6.4 (H) 03/15/2020   Lab Results  Component Value Date   INSULIN 13.8 05/02/2021   INSULIN 14.2 03/15/2020   INSULIN 17.6 06/24/2019   Lab Results  Component Value Date  TSH 1.280 06/24/2019   Lab Results  Component Value Date   CHOL 161 05/02/2021   HDL 48 05/02/2021   LDLCALC 93 05/02/2021   TRIG 110 05/02/2021   CHOLHDL 5.4 (H) 06/23/2019   Lab Results  Component Value Date   VD25OH 42.2 05/02/2021   VD25OH 55.5 03/15/2020   VD25OH 57.6 10/15/2019   Lab Results  Component Value Date   WBC 3.7 06/23/2019   HGB 13.1 06/23/2019   HCT 39.4 06/23/2019   MCV 91 06/23/2019   PLT 233 06/23/2019   No results found for: IRON,  TIBC, FERRITIN  Attestation Statements:   Reviewed by clinician on day of visit: allergies, medications, problem list, medical history, surgical history, family history, social history, and previous encounter notes.  I, Lizbeth Bark, RMA, am acting as Location manager for [employer], DO.  I have reviewed the above documentation for accuracy and completeness, and I agree with the above. Jearld Lesch, DO

## 2021-10-23 ENCOUNTER — Encounter (INDEPENDENT_AMBULATORY_CARE_PROVIDER_SITE_OTHER): Payer: Self-pay | Admitting: Bariatrics

## 2021-10-23 ENCOUNTER — Other Ambulatory Visit: Payer: Self-pay | Admitting: Family Medicine

## 2021-10-23 DIAGNOSIS — I152 Hypertension secondary to endocrine disorders: Secondary | ICD-10-CM

## 2021-11-11 ENCOUNTER — Other Ambulatory Visit (INDEPENDENT_AMBULATORY_CARE_PROVIDER_SITE_OTHER): Payer: Self-pay | Admitting: Bariatrics

## 2021-11-11 DIAGNOSIS — E559 Vitamin D deficiency, unspecified: Secondary | ICD-10-CM

## 2021-11-17 ENCOUNTER — Other Ambulatory Visit: Payer: Self-pay | Admitting: Family Medicine

## 2021-11-17 DIAGNOSIS — E119 Type 2 diabetes mellitus without complications: Secondary | ICD-10-CM

## 2021-11-20 ENCOUNTER — Encounter (INDEPENDENT_AMBULATORY_CARE_PROVIDER_SITE_OTHER): Payer: Self-pay | Admitting: Bariatrics

## 2021-11-20 ENCOUNTER — Ambulatory Visit (INDEPENDENT_AMBULATORY_CARE_PROVIDER_SITE_OTHER): Payer: 59 | Admitting: Bariatrics

## 2021-11-20 VITALS — BP 94/66 | HR 72 | Temp 97.6°F | Ht 62.0 in | Wt 250.0 lb

## 2021-11-20 DIAGNOSIS — E559 Vitamin D deficiency, unspecified: Secondary | ICD-10-CM

## 2021-11-20 DIAGNOSIS — E669 Obesity, unspecified: Secondary | ICD-10-CM

## 2021-11-20 DIAGNOSIS — Z7985 Long-term (current) use of injectable non-insulin antidiabetic drugs: Secondary | ICD-10-CM

## 2021-11-20 DIAGNOSIS — E118 Type 2 diabetes mellitus with unspecified complications: Secondary | ICD-10-CM | POA: Diagnosis not present

## 2021-11-20 DIAGNOSIS — E66813 Obesity, class 3: Secondary | ICD-10-CM

## 2021-11-20 DIAGNOSIS — Z6841 Body Mass Index (BMI) 40.0 and over, adult: Secondary | ICD-10-CM

## 2021-11-20 MED ORDER — VITAMIN D (ERGOCALCIFEROL) 1.25 MG (50000 UNIT) PO CAPS: ORAL_CAPSULE | ORAL | 0 refills | Status: DC

## 2021-11-20 NOTE — Progress Notes (Unsigned)
Chief Complaint:   OBESITY Alicia Lowery is here to discuss her progress with her obesity treatment plan along with follow-up of her obesity related diagnoses. Alicia Lowery is on keeping a food journal and adhering to recommended goals of 1200 calories and 80 grams of protein daily and states she is following her eating plan approximately 75% of the time. Alicia Lowery states she is active while gardening and walking for 30 minutes 3 times per week.  Today's visit was #: 37 Starting weight: 246 lbs Starting date: 06/23/2020 Today's weight: 250 lbs Today's date: 11/20/2021 Total lbs lost to date: 0 Total lbs lost since last in-office visit: 0  Interim History: Alicia Lowery's weight remains the same as her last visit.  Her sleep has been okay, and her stress level is down.  Subjective:   1. Vitamin D deficiency Alicia Lowery is taking vitamin D as directed.  2. Type 2 diabetes mellitus with complications ([facility]) Alicia Lowery is currently taking metformin.  Assessment/Plan:   1. Vitamin D deficiency Alicia Lowery will continue prescription vitamin D 50,000 units once every 2 weeks, and we will refill for 2 months.  - Vitamin D, Ergocalciferol, (DRISDOL) 1.25 MG (50000 UNIT) CAPS capsule; TAKE 1 CAPSULE ONCE EVERY 2 WEEKS.  Dispense: 4 capsule; Refill: 0  2. Type 2 diabetes mellitus with complications ([facility]) Alicia Lowery will continue metformin, and she will continue to reduce her carbohydrates (sweets and starches).  3. Obesity, Current BMI 45.7 Alicia Lowery is currently in the action stage of change. As such, her goal is to continue with weight loss efforts. She has agreed to keeping a food journal and adhering to recommended goals of 1200 calories and 80 grams of protein daily.   Meal planning and intentional eating were discussed.  Low carbohydrate snack sheet was given.  She is to drink more water.  Exercise goals: As is.  Behavioral modification strategies: increasing lean protein intake, decreasing simple carbohydrates,  increasing vegetables, increasing water intake, meal planning and cooking strategies, keeping healthy foods in the home, ways to avoid boredom eating, ways to avoid night time snacking, and planning for success.  Alicia Lowery has agreed to follow-up with our clinic in 4 weeks. She was informed of the importance of frequent follow-up visits to maximize her success with intensive lifestyle modifications for her multiple health conditions.   Objective:   Blood pressure 94/66, pulse 72, temperature 97.6 F (36.4 C), height 5\' 2"  (1.575 m), weight 250 lb (113.4 kg), last menstrual period 08/02/2016, SpO2 96 %. Body mass index is 45.73 kg/m.  General: Cooperative, alert, well developed, in no acute distress. HEENT: Conjunctivae and lids unremarkable. Cardiovascular: Regular rhythm.  Lungs: Normal work of breathing. Neurologic: No focal deficits.   Lab Results  Component Value Date   CREATININE 0.82 05/02/2021   BUN 14 05/02/2021   NA 141 05/02/2021   K 3.8 05/02/2021   CL 101 05/02/2021   CO2 26 05/02/2021   Lab Results  Component Value Date   ALT 22 05/02/2021   AST 21 05/02/2021   ALKPHOS 72 05/02/2021   BILITOT 0.7 05/02/2021   Lab Results  Component Value Date   HGBA1C 7.2 (A) 06/08/2021   HGBA1C 7.2 (H) 05/02/2021   HGBA1C 6.4 (A) 02/06/2021   HGBA1C 6.9 (A) 08/01/2020   HGBA1C 6.4 (H) 03/15/2020   Lab Results  Component Value Date   INSULIN 13.8 05/02/2021   INSULIN 14.2 03/15/2020   INSULIN 17.6 06/24/2019   Lab Results  Component Value Date   TSH 1.280  06/24/2019   Lab Results  Component Value Date   CHOL 161 05/02/2021   HDL 48 05/02/2021   LDLCALC 93 05/02/2021   TRIG 110 05/02/2021   CHOLHDL 5.4 (H) 06/23/2019   Lab Results  Component Value Date   VD25OH 42.2 05/02/2021   VD25OH 55.5 03/15/2020   VD25OH 57.6 10/15/2019   Lab Results  Component Value Date   WBC 3.7 06/23/2019   HGB 13.1 06/23/2019   HCT 39.4 06/23/2019   MCV 91 06/23/2019   PLT 233  06/23/2019   No results found for: "IRON", "TIBC", "FERRITIN"  Attestation Statements:   Reviewed by clinician on day of visit: allergies, medications, problem list, medical history, surgical history, family history, social history, and previous encounter notes.   Trude Mcburney, am acting as Energy manager for [employer], DO.  I have reviewed the above documentation for accuracy and completeness, and I agree with the above. Corinna Capra, DO

## 2021-11-21 ENCOUNTER — Encounter (INDEPENDENT_AMBULATORY_CARE_PROVIDER_SITE_OTHER): Payer: Self-pay | Admitting: Bariatrics

## 2021-12-05 ENCOUNTER — Encounter: Payer: Self-pay | Admitting: Family Medicine

## 2021-12-05 ENCOUNTER — Ambulatory Visit: Payer: 59 | Admitting: Family Medicine

## 2021-12-05 VITALS — BP 128/72 | HR 72 | Temp 97.2°F | Wt 255.2 lb

## 2021-12-05 DIAGNOSIS — E1169 Type 2 diabetes mellitus with other specified complication: Secondary | ICD-10-CM

## 2021-12-05 DIAGNOSIS — E785 Hyperlipidemia, unspecified: Secondary | ICD-10-CM

## 2021-12-05 DIAGNOSIS — I152 Hypertension secondary to endocrine disorders: Secondary | ICD-10-CM

## 2021-12-05 DIAGNOSIS — E118 Type 2 diabetes mellitus with unspecified complications: Secondary | ICD-10-CM | POA: Diagnosis not present

## 2021-12-05 DIAGNOSIS — E1121 Type 2 diabetes mellitus with diabetic nephropathy: Secondary | ICD-10-CM

## 2021-12-05 DIAGNOSIS — E1159 Type 2 diabetes mellitus with other circulatory complications: Secondary | ICD-10-CM

## 2021-12-05 DIAGNOSIS — Z6841 Body Mass Index (BMI) 40.0 and over, adult: Secondary | ICD-10-CM

## 2021-12-05 DIAGNOSIS — Z91199 Patient's noncompliance with other medical treatment and regimen due to unspecified reason: Secondary | ICD-10-CM

## 2021-12-05 DIAGNOSIS — R609 Edema, unspecified: Secondary | ICD-10-CM

## 2021-12-05 LAB — POCT GLYCOSYLATED HEMOGLOBIN (HGB A1C): Hemoglobin A1C: 8.2 % — AB (ref 4.0–5.6)

## 2021-12-05 LAB — POCT UA - MICROALBUMIN
Albumin/Creatinine Ratio, Urine, POC: 82.2
Creatinine, POC: 17.4 mg/dL
Microalbumin Ur, POC: 14.3 mg/L

## 2021-12-05 NOTE — Progress Notes (Signed)
Subjective:    Patient ID: Alicia Lowery, female    DOB: January 22, 1961, 61 y.o.   MRN: [id number]  Alicia Lowery is a 61 y.o. female who presents for follow-up of Type 2 diabetes mellitus.  Patient is checking home blood sugars.   Home blood sugar records: BGs range between 85 and 160 How often is blood sugars being checked: twice a week Current symptoms/problems include none and have been stable. Daily foot checks: yes   Any foot concerns: none at this time  Last eye exam: Dec. Last year Exercise:  staying at least 20 min a day She admits to not taking her metformin on a regular basis.  She is also taking lisinopril/HCTZ and does use furosemide roughly every couple of days.  Continues on Crestor without difficulty. The following portions of the patient's history were reviewed and updated as appropriate: allergies, current medications, past medical history, past social history and problem list.  ROS as in subjective above.     Objective:    Physical Exam Alert and in no distress otherwise not examined. Hemoglobin A1c is 8.2 Blood pressure 128/72, pulse 72, temperature (!) 97.2 F (36.2 C), weight 255 lb 3.2 oz (115.8 kg), last menstrual period 08/02/2016, SpO2 97 %.  Lab Review    Latest Ref Rng & Units 12/05/2021   12:00 PM 06/08/2021    3:21 PM 05/02/2021    2:27 PM 02/06/2021    5:00 PM 08/01/2020    5:20 PM  Diabetic Labs  HbA1c 4.0 - 5.6 % 8.2  7.2  7.2  6.4  6.9   Microalbumin mg/L 14.3       Micro/Creat Ratio  82.2       Chol 100 - 199 mg/dL   299     HDL >37 mg/dL   48     Calc LDL 0 - 99 mg/dL   93     Triglycerides 0 - 149 mg/dL   169     Creatinine 6.78 - 1.00 mg/dL   9.38         05/14/7508   11:23 AM 11/20/2021    8:00 AM 10/16/2021    2:00 PM 09/06/2021   11:14 AM 09/06/2021   11:00 AM  BP/Weight  Systolic BP 128 94 132 124 91  Diastolic BP 72 66 84 78 52  Wt. (Lbs) 255.2 250 250  246  BMI 46.68 kg/m2 45.73 kg/m2 45.73 kg/m2  44.99 kg/m2      02/06/2021     1:45 PM 12/10/2019    8:45 AM  Foot/eye exam completion dates  Foot Form Completion Done Done  Urine microalbumin now shows evidence of nephropathy.  Alicia Lowery  reports that she has never smoked. She has never used smokeless tobacco. She reports current alcohol use. She reports that she does not use drugs.     Assessment & Plan:    Type 2 diabetes mellitus with complications (HCC) - Plan: POCT glycosylated hemoglobin (Hb A1C), POCT UA - Microalbumin  Obesity, Current BMI 45.7  Hyperlipidemia associated with type 2 diabetes mellitus (HCC)  Peripheral edema  Hypertension associated with diabetes (HCC)  Diabetic nephropathy associated with type 2 diabetes mellitus (HCC)  Personal history of noncompliance with medical treatment, presenting hazards to health  I again talked to her at length about the fact that she needs to take her medication regularly, continue with her exercise as well as trying to work on dietary modification.  She needs to take her metformin on a  regular basis.  Discussed the possibility of using other medications including GLP-1 and SGLT2 but at this point she is not interested in adding any more medications to her regimen.  I explained that her noncompliance is hazardous to her health.  Recheck here in 4 months.

## 2021-12-05 NOTE — Patient Instructions (Signed)

## 2021-12-07 ENCOUNTER — Ambulatory Visit: Payer: 59 | Admitting: Family Medicine

## 2021-12-15 ENCOUNTER — Other Ambulatory Visit (INDEPENDENT_AMBULATORY_CARE_PROVIDER_SITE_OTHER): Payer: Self-pay | Admitting: Bariatrics

## 2021-12-15 DIAGNOSIS — E559 Vitamin D deficiency, unspecified: Secondary | ICD-10-CM

## 2021-12-18 ENCOUNTER — Encounter (INDEPENDENT_AMBULATORY_CARE_PROVIDER_SITE_OTHER): Payer: Self-pay | Admitting: Bariatrics

## 2021-12-18 ENCOUNTER — Ambulatory Visit (INDEPENDENT_AMBULATORY_CARE_PROVIDER_SITE_OTHER): Payer: 59 | Admitting: Bariatrics

## 2021-12-18 VITALS — BP 101/57 | HR 81 | Temp 97.5°F | Ht 62.0 in | Wt 247.0 lb

## 2021-12-18 DIAGNOSIS — E559 Vitamin D deficiency, unspecified: Secondary | ICD-10-CM | POA: Diagnosis not present

## 2021-12-18 DIAGNOSIS — E669 Obesity, unspecified: Secondary | ICD-10-CM

## 2021-12-18 DIAGNOSIS — R600 Localized edema: Secondary | ICD-10-CM

## 2021-12-18 DIAGNOSIS — Z6841 Body Mass Index (BMI) 40.0 and over, adult: Secondary | ICD-10-CM | POA: Diagnosis not present

## 2021-12-18 MED ORDER — VITAMIN D (ERGOCALCIFEROL) 1.25 MG (50000 UNIT) PO CAPS: ORAL_CAPSULE | ORAL | 0 refills | Status: DC

## 2021-12-18 MED ORDER — FUROSEMIDE 20 MG PO TABS: ORAL_TABLET | ORAL | 0 refills | Status: DC

## 2021-12-20 ENCOUNTER — Encounter (INDEPENDENT_AMBULATORY_CARE_PROVIDER_SITE_OTHER): Payer: Self-pay

## 2021-12-21 ENCOUNTER — Telehealth: Payer: Self-pay | Admitting: Family Medicine

## 2021-12-21 DIAGNOSIS — Z111 Encounter for screening for respiratory tuberculosis: Secondary | ICD-10-CM

## 2021-12-21 NOTE — Telephone Encounter (Signed)
Pt called and states that she needs to have the TB blood draw for work. Please place orders if ok and pt will need to be advised at [phone].

## 2021-12-22 NOTE — Telephone Encounter (Signed)
Lvm for pt to call back and make appt to have done [facility]

## 2021-12-26 ENCOUNTER — Encounter (INDEPENDENT_AMBULATORY_CARE_PROVIDER_SITE_OTHER): Payer: Self-pay | Admitting: Bariatrics

## 2021-12-26 NOTE — Progress Notes (Signed)
Chief Complaint:   OBESITY Alicia Lowery is here to discuss her progress with her obesity treatment plan along with follow-up of her obesity related diagnoses. Alicia Lowery is on keeping a food journal and adhering to recommended goals of 1200 calories and 80 grams of protein daily and states she is following her eating plan approximately 80% of the time. Alicia Lowery states she is doing 0 minutes 0 times per week.  Today's visit was #: 38 Starting weight: 246 lbs Starting date: 06/23/2020 Today's weight: 247 lbs Today's date: 12/18/2021 Total lbs lost to date: 0 Total lbs lost since last in-office visit: 3  Interim History: Alicia Lowery is down 3 lbs since her last visit. She will start working later this month. She is getting adequate water.   Subjective:   1. Vitamin D deficiency Alicia Lowery is taking Vitamin D.   2. Edema, lower extremity Alicia Lowery is taking Lasix as needed and she is elevating her legs.   Assessment/Plan:   1. Vitamin D deficiency Alicia Lowery will continue prescription Vitamin D 50,000 IU once every 14 days, and we will refill for 2 months.   - Vitamin D, Ergocalciferol, (DRISDOL) 1.25 MG (50000 UNIT) CAPS capsule; TAKE 1 CAPSULE ONCE EVERY 2 WEEKS.  Dispense: 4 capsule; Refill: 0  2. Edema, lower extremity Alicia Lowery will continue Lasix 20 mg once every 3 days, and we will refill for 1 month. She is to continue to elevate legs in the increased heat.   - furosemide (LASIX) 20 MG tablet; TAKE 1 TABLET BY MOUTH EVERY 3 DAYS  Dispense: 10 tablet; Refill: 0  3. Obesity, Current BMI 45.3 Alicia Lowery is currently in the action stage of change. As such, her goal is to continue with weight loss efforts. She has agreed to the Category 2 Plan.   Meal planning and intentional eating. She will prep her evening meals.   Exercise goals: Increase walking.  Behavioral modification strategies: increasing lean protein intake, decreasing simple carbohydrates, increasing vegetables, increasing water intake,  decreasing eating out, no skipping meals, meal planning and cooking strategies, keeping healthy foods in the home, and planning for success.  Alicia Lowery has agreed to follow-up with our clinic in 4 weeks. She was informed of the importance of frequent follow-up visits to maximize her success with intensive lifestyle modifications for her multiple health conditions.   Objective:   Blood pressure (!) 101/57, pulse 81, temperature (!) 97.5 F (36.4 C), height 5\' 2"  (1.575 m), weight 247 lb (112 kg), last menstrual period 08/02/2016, SpO2 92 %. Body mass index is 45.18 kg/m.  General: Cooperative, alert, well developed, in no acute distress. HEENT: Conjunctivae and lids unremarkable. Cardiovascular: Regular rhythm.  Lungs: Normal work of breathing. Neurologic: No focal deficits.   Lab Results  Component Value Date   CREATININE 0.82 05/02/2021   BUN 14 05/02/2021   NA 141 05/02/2021   K 3.8 05/02/2021   CL 101 05/02/2021   CO2 26 05/02/2021   Lab Results  Component Value Date   ALT 22 05/02/2021   AST 21 05/02/2021   ALKPHOS 72 05/02/2021   BILITOT 0.7 05/02/2021   Lab Results  Component Value Date   HGBA1C 8.2 (A) 12/05/2021   HGBA1C 7.2 (A) 06/08/2021   HGBA1C 7.2 (H) 05/02/2021   HGBA1C 6.4 (A) 02/06/2021   HGBA1C 6.9 (A) 08/01/2020   Lab Results  Component Value Date   INSULIN 13.8 05/02/2021   INSULIN 14.2 03/15/2020   INSULIN 17.6 06/24/2019   Lab Results  Component Value  Date   TSH 1.280 06/24/2019   Lab Results  Component Value Date   CHOL 161 05/02/2021   HDL 48 05/02/2021   LDLCALC 93 05/02/2021   TRIG 110 05/02/2021   CHOLHDL 5.4 (H) 06/23/2019   Lab Results  Component Value Date   VD25OH 42.2 05/02/2021   VD25OH 55.5 03/15/2020   VD25OH 57.6 10/15/2019   Lab Results  Component Value Date   WBC 3.7 06/23/2019   HGB 13.1 06/23/2019   HCT 39.4 06/23/2019   MCV 91 06/23/2019   PLT 233 06/23/2019   No results found for: "IRON", "TIBC",  "FERRITIN"  Attestation Statements:   Reviewed by clinician on day of visit: allergies, medications, problem list, medical history, surgical history, family history, social history, and previous encounter notes.   Trude Mcburney, am acting as Energy manager for [employer], DO.  I have reviewed the above documentation for accuracy and completeness, and I agree with the above. Corinna Capra, DO

## 2022-01-03 ENCOUNTER — Telehealth: Payer: Self-pay | Admitting: Family Medicine

## 2022-01-03 NOTE — Telephone Encounter (Signed)
Pt called and states that she does not need the TB test anymore  She wants to know if you think it is ok to get the RSV and the flu shot now she wants to get it at [facility]

## 2022-01-16 ENCOUNTER — Ambulatory Visit (INDEPENDENT_AMBULATORY_CARE_PROVIDER_SITE_OTHER): Payer: 59 | Admitting: Bariatrics

## 2022-01-16 ENCOUNTER — Encounter (INDEPENDENT_AMBULATORY_CARE_PROVIDER_SITE_OTHER): Payer: Self-pay | Admitting: Bariatrics

## 2022-01-16 VITALS — BP 125/68 | HR 84 | Temp 97.9°F | Ht 62.0 in | Wt 243.0 lb

## 2022-01-16 DIAGNOSIS — Z7984 Long term (current) use of oral hypoglycemic drugs: Secondary | ICD-10-CM | POA: Diagnosis not present

## 2022-01-16 DIAGNOSIS — E559 Vitamin D deficiency, unspecified: Secondary | ICD-10-CM | POA: Diagnosis not present

## 2022-01-16 DIAGNOSIS — Z6841 Body Mass Index (BMI) 40.0 and over, adult: Secondary | ICD-10-CM | POA: Diagnosis not present

## 2022-01-16 DIAGNOSIS — E669 Obesity, unspecified: Secondary | ICD-10-CM | POA: Diagnosis not present

## 2022-01-16 DIAGNOSIS — E118 Type 2 diabetes mellitus with unspecified complications: Secondary | ICD-10-CM | POA: Diagnosis not present

## 2022-01-16 MED ORDER — VITAMIN D (ERGOCALCIFEROL) 1.25 MG (50000 UNIT) PO CAPS: ORAL_CAPSULE | ORAL | 0 refills | Status: DC

## 2022-01-17 ENCOUNTER — Encounter: Payer: Self-pay | Admitting: Internal Medicine

## 2022-01-18 ENCOUNTER — Other Ambulatory Visit (INDEPENDENT_AMBULATORY_CARE_PROVIDER_SITE_OTHER): Payer: Self-pay | Admitting: Bariatrics

## 2022-01-18 ENCOUNTER — Encounter: Payer: Self-pay | Admitting: Family Medicine

## 2022-01-18 DIAGNOSIS — R600 Localized edema: Secondary | ICD-10-CM

## 2022-01-25 NOTE — Progress Notes (Unsigned)
Chief Complaint:   OBESITY Alicia Lowery is here to discuss her progress with her obesity treatment plan along with follow-up of her obesity related diagnoses. Alicia Lowery is on the Category 2 Plan and states she is following her eating plan approximately 80% of the time. Alicia Lowery states she is walking for 25 minutes 3 times per week.  Today's visit was #: 39 Starting weight: 246 lbs Starting date: 06/23/2020 Today's weight: 243 lbs Today's date: 01/16/2022 Total lbs lost to date: 3 Total lbs lost since last in-office visit: 4  Interim History: Alicia Lowery is down 4 pounds since her last visit.  She has started a new job and she is satisfied.  She has more control over her meals with this job.  Subjective:   1. Vitamin D deficiency Alicia Lowery is currently taking vitamin D prescription.  2. Type 2 diabetes mellitus with complications (HCC) Alicia Lowery is currently taking metformin with meals, and she has stopped Ozempic.  Assessment/Plan:   1. Vitamin D deficiency Alicia Lowery will continue prescription vitamin D 50,000 units once every 14 days, and we will refill a 63-month supply.  - Vitamin D, Ergocalciferol, (DRISDOL) 1.25 MG (50000 UNIT) CAPS capsule; TAKE 1 CAPSULE ONCE EVERY 2 WEEKS.  Dispense: 4 capsule; Refill: 0  2. Type 2 diabetes mellitus with complications (HCC) Alicia Lowery will continue her medication and she will take her metformin at bedtime.  3. Obesity, Current BMI 44.5 Alicia Lowery is currently in the action stage of change. As such, her goal is to continue with weight loss efforts. She has agreed to the Category 2 Plan and keeping a food journal and adhering to recommended goals of 1200 calories and 80 grams of protein daily.   Exercise goals: As is.   Behavioral modification strategies: increasing lean protein intake, decreasing simple carbohydrates, increasing vegetables, increasing water intake, decreasing eating out, no skipping meals, meal planning and cooking strategies, keeping healthy foods  in the home, and planning for success.  Alicia Lowery has agreed to follow-up with our clinic in 4 weeks. She was informed of the importance of frequent follow-up visits to maximize her success with intensive lifestyle modifications for her multiple health conditions.   Objective:   Blood pressure 125/68, pulse 84, temperature 97.9 F (36.6 C), height 5\' 2"  (1.575 m), weight 243 lb (110.2 kg), last menstrual period 08/02/2016, SpO2 93 %. Body mass index is 44.45 kg/m.  General: Cooperative, alert, well developed, in no acute distress. HEENT: Conjunctivae and lids unremarkable. Cardiovascular: Regular rhythm.  Lungs: Normal work of breathing. Neurologic: No focal deficits.   Lab Results  Component Value Date   CREATININE 0.82 05/02/2021   BUN 14 05/02/2021   NA 141 05/02/2021   K 3.8 05/02/2021   CL 101 05/02/2021   CO2 26 05/02/2021   Lab Results  Component Value Date   ALT 22 05/02/2021   AST 21 05/02/2021   ALKPHOS 72 05/02/2021   BILITOT 0.7 05/02/2021   Lab Results  Component Value Date   HGBA1C 8.2 (A) 12/05/2021   HGBA1C 7.2 (A) 06/08/2021   HGBA1C 7.2 (H) 05/02/2021   HGBA1C 6.4 (A) 02/06/2021   HGBA1C 6.9 (A) 08/01/2020   Lab Results  Component Value Date   INSULIN 13.8 05/02/2021   INSULIN 14.2 03/15/2020   INSULIN 17.6 06/24/2019   Lab Results  Component Value Date   TSH 1.280 06/24/2019   Lab Results  Component Value Date   CHOL 161 05/02/2021   HDL 48 05/02/2021   LDLCALC 93 05/02/2021  TRIG 110 05/02/2021   CHOLHDL 5.4 (H) 06/23/2019   Lab Results  Component Value Date   VD25OH 42.2 05/02/2021   VD25OH 55.5 03/15/2020   VD25OH 57.6 10/15/2019   Lab Results  Component Value Date   WBC 3.7 06/23/2019   HGB 13.1 06/23/2019   HCT 39.4 06/23/2019   MCV 91 06/23/2019   PLT 233 06/23/2019   No results found for: "IRON", "TIBC", "FERRITIN"  Attestation Statements:   Reviewed by clinician on day of visit: allergies, medications, problem list,  medical history, surgical history, family history, social history, and previous encounter notes.   Trude Mcburney, am acting as Energy manager for [employer], DO.  I have reviewed the above documentation for accuracy and completeness, and I agree with the above. Corinna Capra, DO

## 2022-01-29 ENCOUNTER — Encounter (INDEPENDENT_AMBULATORY_CARE_PROVIDER_SITE_OTHER): Payer: Self-pay | Admitting: Bariatrics

## 2022-02-08 ENCOUNTER — Encounter: Payer: 59 | Admitting: Family Medicine

## 2022-02-12 ENCOUNTER — Encounter: Payer: 59 | Admitting: Family Medicine

## 2022-02-13 ENCOUNTER — Encounter (INDEPENDENT_AMBULATORY_CARE_PROVIDER_SITE_OTHER): Payer: Self-pay | Admitting: Adult Health

## 2022-02-13 ENCOUNTER — Ambulatory Visit (INDEPENDENT_AMBULATORY_CARE_PROVIDER_SITE_OTHER): Payer: 59 | Admitting: Adult Health

## 2022-02-13 VITALS — BP 112/65 | HR 76 | Temp 98.5°F | Ht 62.0 in | Wt 242.0 lb

## 2022-02-13 DIAGNOSIS — E118 Type 2 diabetes mellitus with unspecified complications: Secondary | ICD-10-CM | POA: Diagnosis not present

## 2022-02-13 DIAGNOSIS — Z6841 Body Mass Index (BMI) 40.0 and over, adult: Secondary | ICD-10-CM | POA: Diagnosis not present

## 2022-02-13 DIAGNOSIS — Z7985 Long-term (current) use of injectable non-insulin antidiabetic drugs: Secondary | ICD-10-CM | POA: Diagnosis not present

## 2022-02-13 DIAGNOSIS — Z7984 Long term (current) use of oral hypoglycemic drugs: Secondary | ICD-10-CM

## 2022-02-13 DIAGNOSIS — E669 Obesity, unspecified: Secondary | ICD-10-CM | POA: Diagnosis not present

## 2022-02-19 ENCOUNTER — Other Ambulatory Visit: Payer: Self-pay | Admitting: Family Medicine

## 2022-02-19 DIAGNOSIS — E1169 Type 2 diabetes mellitus with other specified complication: Secondary | ICD-10-CM

## 2022-02-21 NOTE — Progress Notes (Signed)
Chief Complaint:   OBESITY Alicia Lowery is here to discuss her progress with her obesity treatment plan along with follow-up of her obesity related diagnoses. Alicia Lowery is on the Category 2 Plan and keeping a food journal and adhering to recommended goals of 1200 calories and 80 protein and states she is following her eating plan approximately 85% of the time. Alicia Lowery states she is walking 30 minutes 3-4 times per week.  Today's visit was #: 29 Starting weight: 246 lbs Starting date: 06/23/2020 Today's weight: 242 lbs Today's date: 02/13/2022 Total lbs lost to date: 4 lbs Total lbs lost since last in-office visit: 1 lb  Interim History:  Saarah stopped Ozempic 06/2021 due to national drug shortage.   She is currently on metformin 500 mg BID with meals.   She prefers to remain off Ozempic.   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  She is  bilingual spanish.  Subjective:   1. Type 2 diabetes mellitus with complications (HCC)  Currently on metformin 500 mg BID with meals, managed by PCP.   Per patient she stopped Ozempic 06/2021 due to national drug shortage.   Lab Results  Component Value Date   HGBA1C 8.2 (A) 12/05/2021   HGBA1C 7.2 (A) 06/08/2021   HGBA1C 7.2 (H) 05/02/2021  A1c not at goal.  Assessment/Plan:   1. Type 2 diabetes mellitus with complications (HCC) Continue metformin BID per PCP.   2. Obesity, Current BMI 44.3 Handout:  100-200 calorie snack sheet and Eating out guide.  Change from regular [place] Tea to diet [place] Pe[place] Tea.  Alicia Lowery is currently in the action stage of change. As such, her goal is to continue with weight loss efforts. She has agreed to the Category 2 Plan and keeping a food journal and adhering to recommended goals of 1200 calories and 80 protein daily.    Exercise goals:  As is.   Behavioral modification strategies: increasing lean protein intake, decreasing simple carbohydrates, increasing water  intake, meal planning and cooking strategies, keeping healthy foods in the home, and planning for success.  Alicia Lowery has agreed to follow-up with our clinic in 4 weeks. She was informed of the importance of frequent follow-up visits to maximize her success with intensive lifestyle modifications for her multiple health conditions.   Objective:   Blood pressure 112/65, pulse 76, temperature 98.5 F (36.9 C), height 5\' 2"  (1.575 m), weight 242 lb (109.8 kg), last menstrual period 08/02/2016, SpO2 96 %. Body mass index is 44.26 kg/m.  General: Cooperative, alert, well developed, in no acute distress. HEENT: Conjunctivae and lids unremarkable. Cardiovascular: Regular rhythm.  Lungs: Normal work of breathing. Neurologic: No focal deficits.   Lab Results  Component Value Date   CREATININE 0.82 05/02/2021   BUN 14 05/02/2021   NA 141 05/02/2021   K 3.8 05/02/2021   CL 101 05/02/2021   CO2 26 05/02/2021   Lab Results  Component Value Date   ALT 22 05/02/2021   AST 21 05/02/2021   ALKPHOS 72 05/02/2021   BILITOT 0.7 05/02/2021   Lab Results  Component Value Date   HGBA1C 8.2 (A) 12/05/2021   HGBA1C 7.2 (A) 06/08/2021   HGBA1C 7.2 (H) 05/02/2021   HGBA1C 6.4 (A) 02/06/2021   HGBA1C 6.9 (A) 08/01/2020   Lab Results  Component Value Date   INSULIN 13.8 05/02/2021   INSULIN 14.2 03/15/2020   INSULIN 17.6 06/24/2019   Lab Results  Component Value  Date   TSH 1.280 06/24/2019   Lab Results  Component Value Date   CHOL 161 05/02/2021   HDL 48 05/02/2021   LDLCALC 93 05/02/2021   TRIG 110 05/02/2021   CHOLHDL 5.4 (H) 06/23/2019   Lab Results  Component Value Date   VD25OH 42.2 05/02/2021   VD25OH 55.5 03/15/2020   VD25OH 57.6 10/15/2019   Lab Results  Component Value Date   WBC 3.7 06/23/2019   HGB 13.1 06/23/2019   HCT 39.4 06/23/2019   MCV 91 06/23/2019   PLT 233 06/23/2019   No results found for: "IRON", "TIBC", "FERRITIN"  Attestation Statements:   Reviewed  by clinician on day of visit: allergies, medications, problem list, medical history, surgical history, family history, social history, and previous encounter notes.  I, Davy Pique, RMA, am acting as Location manager for Mina Marble, NP.  I have reviewed the above documentation for accuracy and completeness, and I agree with the above. -  Chaitanya Amedee d. Christol Thetford, NP-C

## 2022-03-01 ENCOUNTER — Other Ambulatory Visit (INDEPENDENT_AMBULATORY_CARE_PROVIDER_SITE_OTHER): Payer: 59

## 2022-03-01 DIAGNOSIS — Z23 Encounter for immunization: Secondary | ICD-10-CM

## 2022-03-05 ENCOUNTER — Encounter: Payer: Self-pay | Admitting: Internal Medicine

## 2022-03-13 ENCOUNTER — Encounter (INDEPENDENT_AMBULATORY_CARE_PROVIDER_SITE_OTHER): Payer: Self-pay | Admitting: Adult Health

## 2022-03-13 ENCOUNTER — Ambulatory Visit (INDEPENDENT_AMBULATORY_CARE_PROVIDER_SITE_OTHER): Payer: 59 | Admitting: Adult Health

## 2022-03-13 VITALS — BP 112/74 | HR 71 | Temp 97.5°F | Ht 62.0 in | Wt 243.0 lb

## 2022-03-13 DIAGNOSIS — Z6841 Body Mass Index (BMI) 40.0 and over, adult: Secondary | ICD-10-CM

## 2022-03-13 DIAGNOSIS — Z7984 Long term (current) use of oral hypoglycemic drugs: Secondary | ICD-10-CM | POA: Diagnosis not present

## 2022-03-13 DIAGNOSIS — E118 Type 2 diabetes mellitus with unspecified complications: Secondary | ICD-10-CM | POA: Diagnosis not present

## 2022-03-13 DIAGNOSIS — E669 Obesity, unspecified: Secondary | ICD-10-CM

## 2022-03-13 DIAGNOSIS — E559 Vitamin D deficiency, unspecified: Secondary | ICD-10-CM

## 2022-03-14 ENCOUNTER — Encounter (INDEPENDENT_AMBULATORY_CARE_PROVIDER_SITE_OTHER): Payer: Self-pay | Admitting: Adult Health

## 2022-03-14 ENCOUNTER — Other Ambulatory Visit (INDEPENDENT_AMBULATORY_CARE_PROVIDER_SITE_OTHER): Payer: Self-pay | Admitting: Adult Health

## 2022-03-14 DIAGNOSIS — E559 Vitamin D deficiency, unspecified: Secondary | ICD-10-CM

## 2022-03-14 MED ORDER — VITAMIN D (ERGOCALCIFEROL) 1.25 MG (50000 UNIT) PO CAPS: ORAL_CAPSULE | ORAL | 0 refills | Status: DC

## 2022-03-15 LAB — VITAMIN D 25 HYDROXY (VIT D DEFICIENCY, FRACTURES): Vit D, 25-Hydroxy: 48.7 ng/mL (ref 30.0–100.0)

## 2022-03-15 LAB — INSULIN, RANDOM: INSULIN: 12.5 u[IU]/mL (ref 2.6–24.9)

## 2022-03-15 LAB — HEMOGLOBIN A1C
Est. average glucose Bld gHb Est-mCnc: 157 mg/dL
Hgb A1c MFr Bld: 7.1 % — ABNORMAL HIGH (ref 4.8–5.6)

## 2022-03-15 NOTE — Progress Notes (Signed)
Chief Complaint:   OBESITY Alicia Lowery is here to discuss her progress with her obesity treatment plan along with follow-up of her obesity related diagnoses. Alicia Lowery is on the Category 2 Plan and keeping a food journal and adhering to recommended goals of 1200 calories and 80 protein and states she is following her eating plan approximately 80% of the time. Alicia Lowery states she is walking 25 minutes 3 times per week.  Today's visit was #: 59 Starting weight: 246 lbs Starting date: 06/23/2020 Today's weight: 243 lbs Today's date: 03/13/2022 Total lbs lost to date: 3 lbs Total lbs lost since last in-office visit: +1 lb  Interim History:  She endorses decreased appetite with colder weather.  She made chili- lean meat and vegetables  Subjective:   1. Vitamin D deficiency She is currently taking prescription vitamin D 50,000 IU each week. She denies nausea, vomiting or muscle weakness.  05/02/2021, Vitamin D level 42.2.    2. Type 2 diabetes mellitus with complications ([place]) PCP manages diabetes- Metformin 500mg  QD. Lab Results  Component Value Date   HGBA1C 7.1 (H) 03/13/2022   HGBA1C 8.2 (A) 12/05/2021   HGBA1C 7.2 (A) 06/08/2021     Assessment/Plan:   1. Vitamin D deficiency Check labs today.  - Insulin, random - VITAMIN D 25 Hydroxy (Vit-D Deficiency, Fractures)  2. Type 2 diabetes mellitus with complications (HCC) Continue to decrease sugar and carbohydrates.  Follow up with PCP.   Check labs today.  - Hemoglobin A1c - Hemoglobin A1c - Insulin, random  3. Obesity, current BMI 44.6 Handouts:  Thanksgiving strategies, Holiday recipes.   Alicia Lowery is currently in the action stage of change. As such, her goal is to continue with weight loss efforts. She has agreed to the Category 2 Plan.   Exercise goals:  As is.   Behavioral modification strategies: increasing lean protein intake, decreasing simple carbohydrates, meal planning and cooking strategies, keeping healthy  foods in the home, and planning for success.  Alicia Lowery has agreed to follow-up with our clinic in 4 weeks. She was informed of the importance of frequent follow-up visits to maximize her success with intensive lifestyle modifications for her multiple health conditions.   Alicia Lowery was informed we would discuss her lab results at her next visit unless there is a critical issue that needs to be addressed sooner. Alicia Lowery agreed to keep her next visit at the agreed upon time to discuss these results.  Objective:   Blood pressure 112/74, pulse 71, temperature (!) 97.5 F (36.4 C), height 5\' 2"  (1.575 m), weight 243 lb (110.2 kg), last menstrual period 08/02/2016, SpO2 98 %. Body mass index is 44.45 kg/m.  General: Cooperative, alert, well developed, in no acute distress. HEENT: Conjunctivae and lids unremarkable. Cardiovascular: Regular rhythm.  Lungs: Normal work of breathing. Neurologic: No focal deficits.   Lab Results  Component Value Date   CREATININE 0.82 05/02/2021   BUN 14 05/02/2021   NA 141 05/02/2021   K 3.8 05/02/2021   CL 101 05/02/2021   CO2 26 05/02/2021   Lab Results  Component Value Date   ALT 22 05/02/2021   AST 21 05/02/2021   ALKPHOS 72 05/02/2021   BILITOT 0.7 05/02/2021   Lab Results  Component Value Date   HGBA1C 7.1 (H) 03/13/2022   HGBA1C 8.2 (A) 12/05/2021   HGBA1C 7.2 (A) 06/08/2021   HGBA1C 7.2 (H) 05/02/2021   HGBA1C 6.4 (A) 02/06/2021   Lab Results  Component Value Date   INSULIN  12.5 03/13/2022   INSULIN 13.8 05/02/2021   INSULIN 14.2 03/15/2020   INSULIN 17.6 06/24/2019   Lab Results  Component Value Date   TSH 1.280 06/24/2019   Lab Results  Component Value Date   CHOL 161 05/02/2021   HDL 48 05/02/2021   LDLCALC 93 05/02/2021   TRIG 110 05/02/2021   CHOLHDL 5.4 (H) 06/23/2019   Lab Results  Component Value Date   VD25OH 48.7 03/13/2022   VD25OH 42.2 05/02/2021   VD25OH 55.5 03/15/2020   Lab Results  Component Value Date    WBC 3.7 06/23/2019   HGB 13.1 06/23/2019   HCT 39.4 06/23/2019   MCV 91 06/23/2019   PLT 233 06/23/2019   No results found for: "IRON", "TIBC", "FERRITIN"  Attestation Statements:   Reviewed by clinician on day of visit: allergies, medications, problem list, medical history, surgical history, family history, social history, and previous encounter notes.  I, Alicia Lowery, RMA, am acting as Energy manager for Alicia Hamburger, NP.  I have reviewed the above documentation for accuracy and completeness, and I agree with the above. -  Alicia Lowery d. Nira Visscher, NP-C

## 2022-03-28 ENCOUNTER — Other Ambulatory Visit (INDEPENDENT_AMBULATORY_CARE_PROVIDER_SITE_OTHER): Payer: Self-pay | Admitting: Adult Health

## 2022-03-28 DIAGNOSIS — E559 Vitamin D deficiency, unspecified: Secondary | ICD-10-CM

## 2022-04-19 ENCOUNTER — Encounter (INDEPENDENT_AMBULATORY_CARE_PROVIDER_SITE_OTHER): Payer: Self-pay | Admitting: Internal Medicine

## 2022-04-19 ENCOUNTER — Ambulatory Visit (INDEPENDENT_AMBULATORY_CARE_PROVIDER_SITE_OTHER): Payer: 59 | Admitting: Internal Medicine

## 2022-04-19 VITALS — BP 136/82 | HR 75 | Temp 98.0°F | Ht 62.0 in | Wt 248.0 lb

## 2022-04-19 DIAGNOSIS — E669 Obesity, unspecified: Secondary | ICD-10-CM | POA: Diagnosis not present

## 2022-04-19 DIAGNOSIS — E7849 Other hyperlipidemia: Secondary | ICD-10-CM | POA: Diagnosis not present

## 2022-04-19 DIAGNOSIS — Z6841 Body Mass Index (BMI) 40.0 and over, adult: Secondary | ICD-10-CM | POA: Diagnosis not present

## 2022-04-19 DIAGNOSIS — E559 Vitamin D deficiency, unspecified: Secondary | ICD-10-CM | POA: Diagnosis not present

## 2022-04-19 DIAGNOSIS — Z7984 Long term (current) use of oral hypoglycemic drugs: Secondary | ICD-10-CM | POA: Diagnosis not present

## 2022-04-19 DIAGNOSIS — R600 Localized edema: Secondary | ICD-10-CM

## 2022-04-19 DIAGNOSIS — E118 Type 2 diabetes mellitus with unspecified complications: Secondary | ICD-10-CM

## 2022-04-19 NOTE — Progress Notes (Signed)
Chief Complaint:   OBESITY Alicia Lowery is here to discuss her progress with her obesity treatment plan along with follow-up of her obesity related diagnoses. Alicia Lowery is on the Category 2 Plan and states she is following her eating plan approximately 80% of the time. Alicia Lowery states she is walking 35 minutes 5 times per week.  Today's visit was #: 42 Starting weight: 246 lbs Starting date: 06/24/2019 Today's weight: 248 lbs Today's date: 04/19/2022 Total lbs lost to date: 0 Total lbs lost since last in-office visit: +5  Interim History: This is my first encounter with patient.  She reported good adherence to meal plan but acknowledged eating more carbs than usual, sometimes skipping meals and straying from meal plan at times.  She notes adequate satiety, satiation and denies abnormal cravings.  She is not interested in pharmacotherapy.  She is physically active.  Subjective:   1. Type 2 diabetes mellitus with complications (HCC) Discussed labs with patient today. Patient's A1c is 7.1 and down from 8.2. She is on Metformin 500 mg BID with no side effects. Alicia Lowery is not interested in GLP-1 agonist.  2. Other hyperlipidemia Discussed labs with patient today. LDL 93. Pt is on high dose statin therapy.  3. Vitamin D deficiency Discussed labs with patient today. Vitamin D level 42 and pt is on high dose Vitamin D now.  Assessment/Plan:   1. Type 2 diabetes mellitus with complications (HCC) Continue weight loss therapy and Metformin.  2. Other hyperlipidemia Check fasting lipid panel at next visit. Continue statin therapy.  3. Vitamin D deficiency Check Vitamin D level and consider switching to OTC supplement.  Refill- Vitamin D, Ergocalciferol, (DRISDOL) 1.25 MG (50000 UNIT) CAPS capsule; TAKE 1 CAPSULE ONCE EVERY 2 WEEKS.  Dispense: 4 capsule; Refill: 0  4. Obesity, current BMI 45.4 Plan: On 24-hour recall, I do not suspect she is following meal plan closely.  I also asked her if  she needed to call.  She stated yes.  She has been coming to our center for several months and has not lost much weight.  I would like for her to schedule an extended visit for [place] to further troubleshoot, assess level of readiness and employ other approaches.  Alicia Lowery is currently in the action stage of change. As such, her goal is to continue with weight loss efforts. She has agreed to the Category 2 Plan.   Exercise goals:  As is  Behavioral modification strategies: increasing lean protein intake, decreasing simple carbohydrates, increasing water intake, no skipping meals, meal planning and cooking strategies, better snacking choices, avoiding temptations, and planning for success.  Alicia Lowery has agreed to follow-up with our clinic in 3 weeks. She was informed of the importance of frequent follow-up visits to maximize her success with intensive lifestyle modifications for her multiple health conditions.   Objective:   Blood pressure 136/82, pulse 75, temperature 98 F (36.7 C), height 5\' 2"  (1.575 m), weight 248 lb (112.5 kg), last menstrual period 08/02/2016, SpO2 95 %. Body mass index is 45.36 kg/m.  General: Cooperative, alert, well developed, in no acute distress. HEENT: Conjunctivae and lids unremarkable. Cardiovascular: Regular rhythm.  Lungs: Normal work of breathing. Neurologic: No focal deficits.   Lab Results  Component Value Date   CREATININE 0.82 05/02/2021   BUN 14 05/02/2021   NA 141 05/02/2021   K 3.8 05/02/2021   CL 101 05/02/2021   CO2 26 05/02/2021   Lab Results  Component Value Date   ALT 22  05/02/2021   AST 21 05/02/2021   ALKPHOS 72 05/02/2021   BILITOT 0.7 05/02/2021   Lab Results  Component Value Date   HGBA1C 7.1 (H) 03/13/2022   HGBA1C 8.2 (A) 12/05/2021   HGBA1C 7.2 (A) 06/08/2021   HGBA1C 7.2 (H) 05/02/2021   HGBA1C 6.4 (A) 02/06/2021   Lab Results  Component Value Date   INSULIN 12.5 03/13/2022   INSULIN 13.8 05/02/2021   INSULIN 14.2  03/15/2020   INSULIN 17.6 06/24/2019   Lab Results  Component Value Date   TSH 1.280 06/24/2019   Lab Results  Component Value Date   CHOL 161 05/02/2021   HDL 48 05/02/2021   LDLCALC 93 05/02/2021   TRIG 110 05/02/2021   CHOLHDL 5.4 (H) 06/23/2019   Lab Results  Component Value Date   VD25OH 48.7 03/13/2022   VD25OH 42.2 05/02/2021   VD25OH 55.5 03/15/2020   Lab Results  Component Value Date   WBC 3.7 06/23/2019   HGB 13.1 06/23/2019   HCT 39.4 06/23/2019   MCV 91 06/23/2019   PLT 233 06/23/2019   Attestation Statements:   Reviewed by clinician on day of visit: allergies, medications, problem list, medical history, surgical history, family history, social history, and previous encounter notes.  Time spent on visit including pre-visit chart review and post-visit care and charting was 20 minutes.   I, Kyung Rudd, BS, CMA, am acting as transcriptionist for Worthy Rancher, MD.  I have reviewed the above documentation for accuracy and completeness, and I agree with the above. -Worthy Rancher, MD

## 2022-04-23 MED ORDER — VITAMIN D (ERGOCALCIFEROL) 1.25 MG (50000 UNIT) PO CAPS: ORAL_CAPSULE | ORAL | 0 refills | Status: DC

## 2022-04-29 ENCOUNTER — Other Ambulatory Visit: Payer: Self-pay | Admitting: Family Medicine

## 2022-04-29 DIAGNOSIS — I152 Hypertension secondary to endocrine disorders: Secondary | ICD-10-CM

## 2022-04-30 NOTE — Telephone Encounter (Signed)
Has upcoming appt in Janaury 2024

## 2022-05-02 IMAGING — MG MM DIGITAL SCREENING BILAT W/ TOMO AND CAD
6 of 10 series · 6 of 30 positions shown · non-contrast
Comparison: None.

CLINICAL DATA: Screening.

EXAM:
DIGITAL SCREENING BILATERAL MAMMOGRAM WITH TOMOSYNTHESIS AND CAD
TECHNIQUE: Bilateral screening digital craniocaudal and mediolateral oblique
mammograms were obtained. Bilateral screening digital breast
tomosynthesis was performed. The images were evaluated with
computer-aided detection.

[R CC synth-2D (1 of 2)]
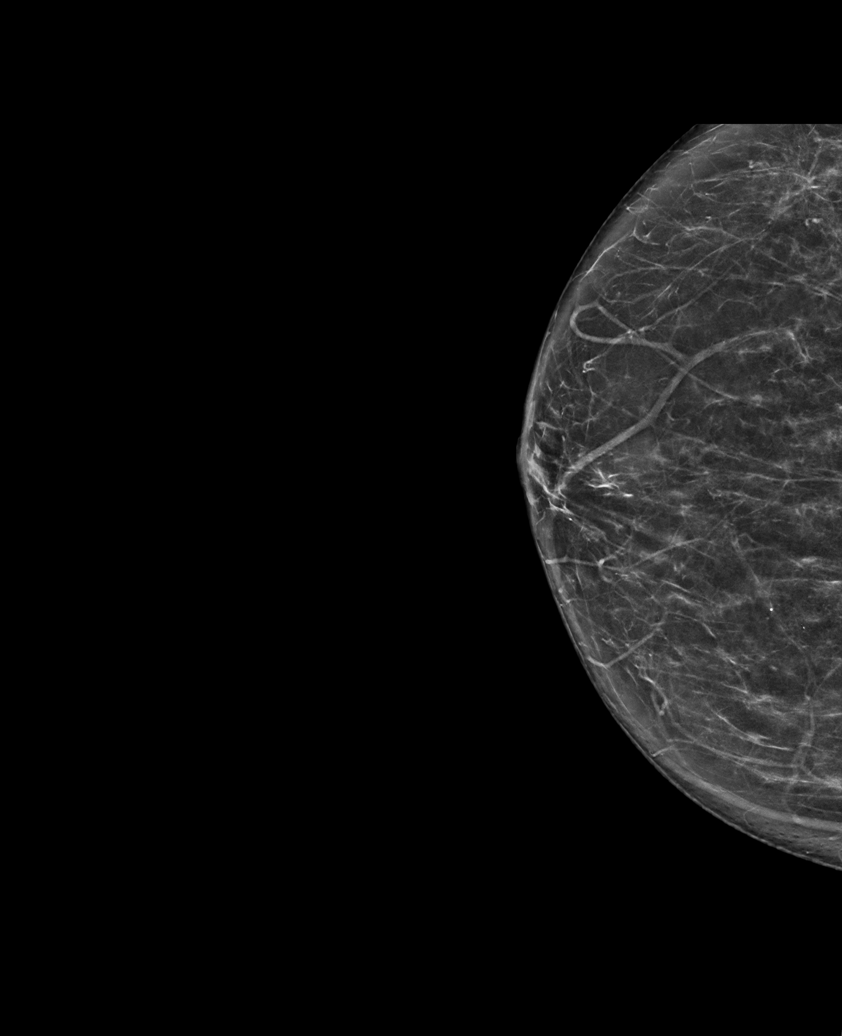

[R CC synth-2D (2 of 2)]
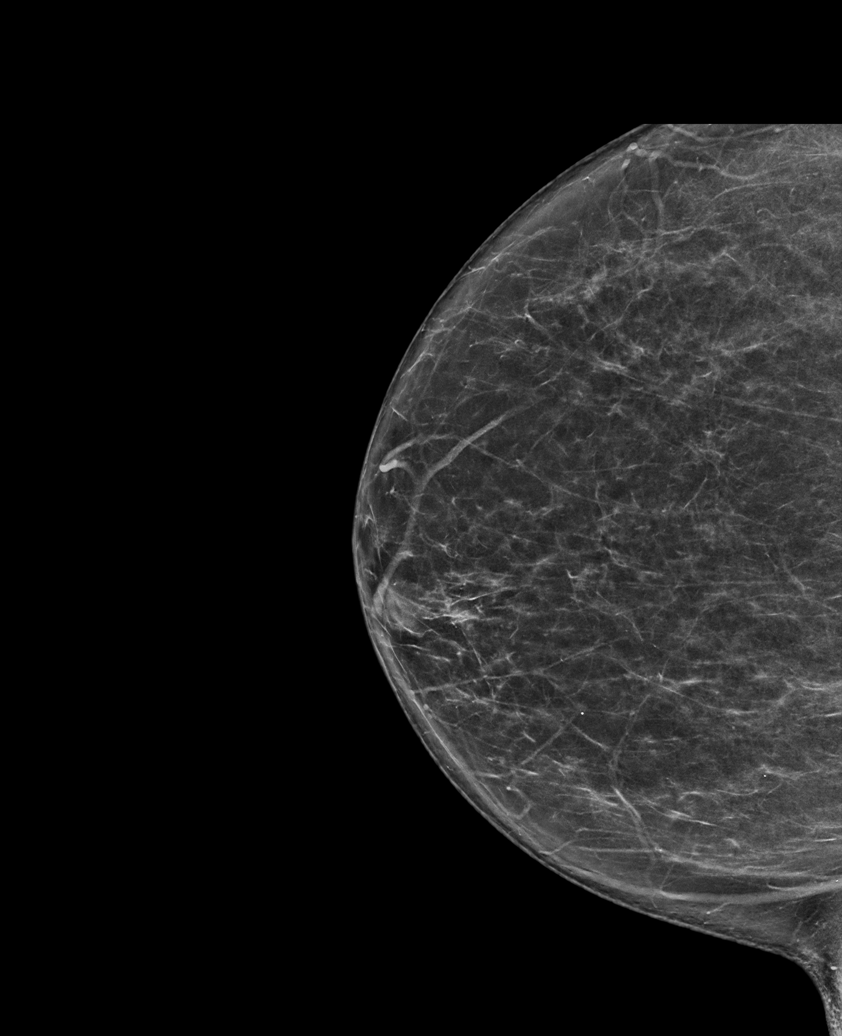

[R MLO synth-2D]
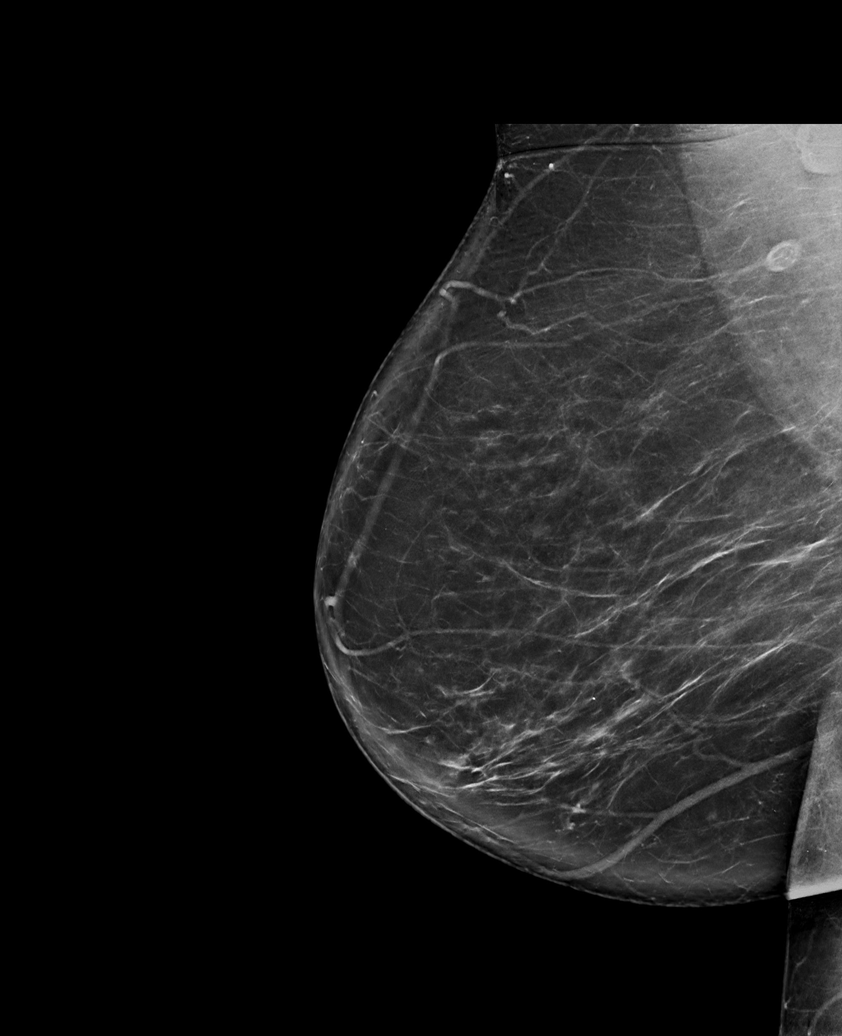

[L MLO synth-2D]
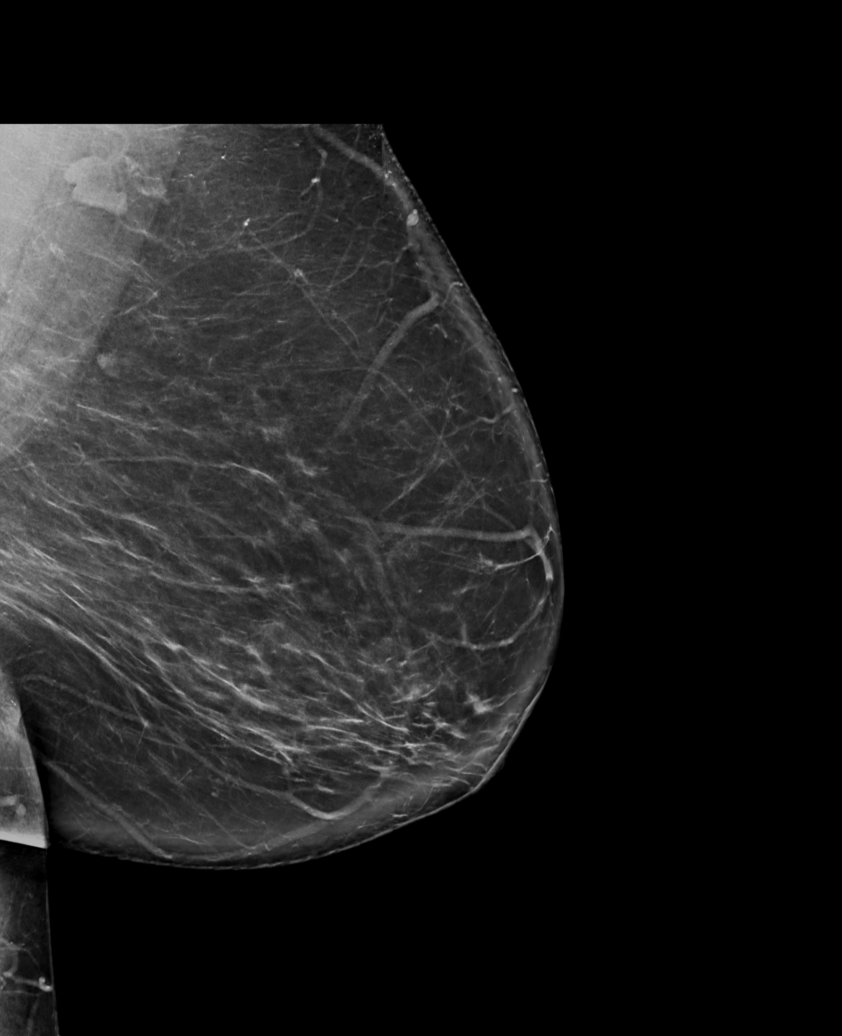

[L CC synth-2D]
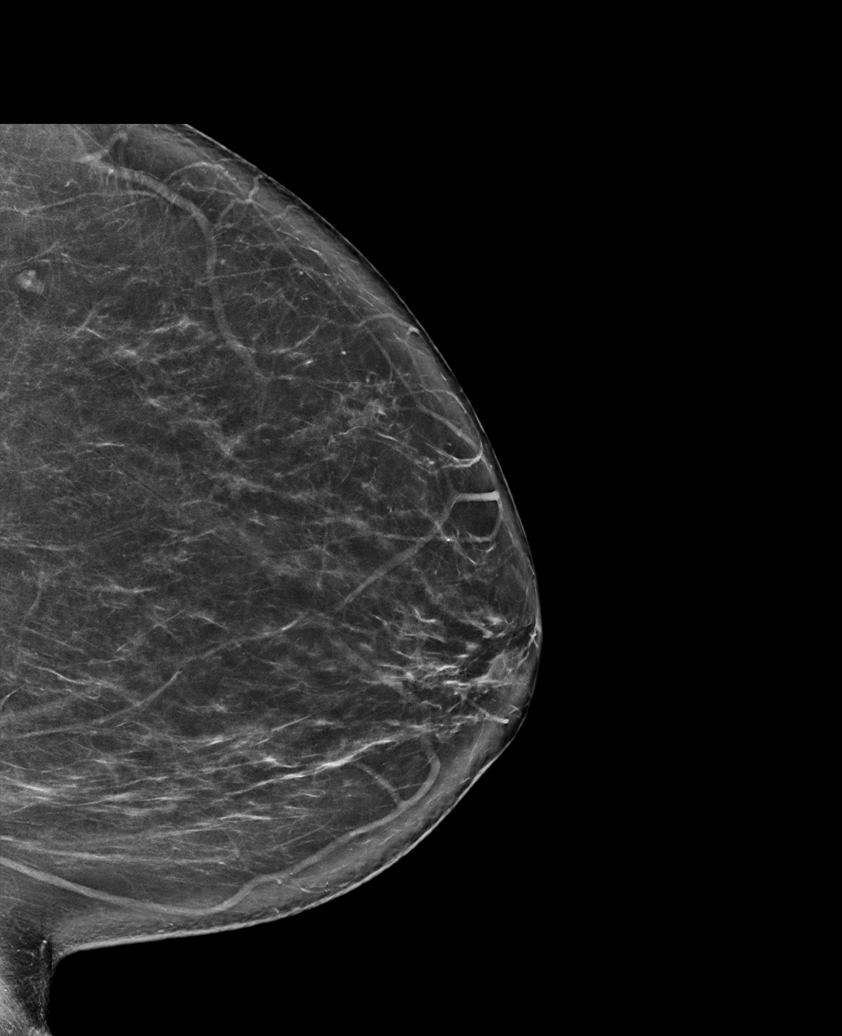

[R CC tomo · tomo slice 39/76.0]
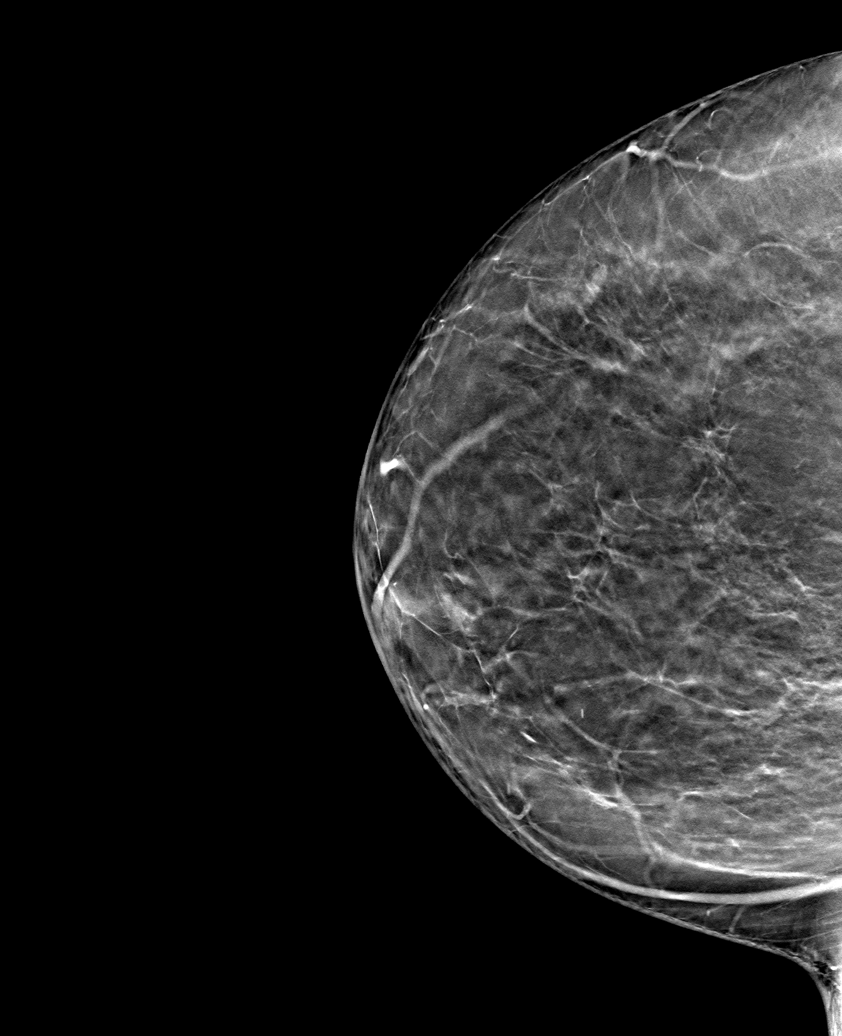

[6 of 30 positions shown; findings below may reference images not displayed]

ACR Breast Density Category b: There are scattered areas of
fibroglandular density.
FINDINGS: There are no findings suspicious for malignancy.
IMPRESSION: No mammographic evidence of malignancy. A result letter of this
screening mammogram will be mailed directly to the patient.

RECOMMENDATION:
Screening mammogram in one year. (Code:[id number]-[id number])

BI-RADS CATEGORY  1: Negative.

## 2022-05-18 ENCOUNTER — Ambulatory Visit
Admission: RE | Admit: 2022-05-18 | Discharge: 2022-05-18 | Disposition: A | Discharge status: Home / Self Care | Payer: 59 | Source: Ambulatory Visit | Attending: Family Medicine | Admitting: Family Medicine

## 2022-05-18 DIAGNOSIS — Z1231 Encounter for screening mammogram for malignant neoplasm of breast: Secondary | ICD-10-CM | POA: Diagnosis not present

## 2022-05-21 ENCOUNTER — Encounter (INDEPENDENT_AMBULATORY_CARE_PROVIDER_SITE_OTHER): Payer: Self-pay

## 2022-05-21 ENCOUNTER — Ambulatory Visit (INDEPENDENT_AMBULATORY_CARE_PROVIDER_SITE_OTHER): Payer: 59 | Admitting: Adult Health

## 2022-05-25 ENCOUNTER — Encounter: Payer: Self-pay | Admitting: Internal Medicine

## 2022-05-31 ENCOUNTER — Ambulatory Visit (INDEPENDENT_AMBULATORY_CARE_PROVIDER_SITE_OTHER): Payer: 59 | Admitting: Adult Health

## 2022-05-31 ENCOUNTER — Encounter (INDEPENDENT_AMBULATORY_CARE_PROVIDER_SITE_OTHER): Payer: Self-pay | Admitting: Adult Health

## 2022-05-31 VITALS — BP 119/81 | HR 71 | Temp 98.2°F | Ht 62.0 in | Wt 240.0 lb

## 2022-05-31 DIAGNOSIS — Z6841 Body Mass Index (BMI) 40.0 and over, adult: Secondary | ICD-10-CM | POA: Diagnosis not present

## 2022-05-31 DIAGNOSIS — R600 Localized edema: Secondary | ICD-10-CM

## 2022-05-31 DIAGNOSIS — E669 Obesity, unspecified: Secondary | ICD-10-CM | POA: Diagnosis not present

## 2022-05-31 DIAGNOSIS — E559 Vitamin D deficiency, unspecified: Secondary | ICD-10-CM | POA: Diagnosis not present

## 2022-05-31 DIAGNOSIS — Z7985 Long-term (current) use of injectable non-insulin antidiabetic drugs: Secondary | ICD-10-CM

## 2022-05-31 DIAGNOSIS — E118 Type 2 diabetes mellitus with unspecified complications: Secondary | ICD-10-CM | POA: Diagnosis not present

## 2022-05-31 MED ORDER — VITAMIN D (ERGOCALCIFEROL) 1.25 MG (50000 UNIT) PO CAPS: ORAL_CAPSULE | ORAL | 0 refills | Status: DC

## 2022-06-06 NOTE — Progress Notes (Signed)
Chief Complaint:   OBESITY Alicia Lowery is here to discuss her progress with her obesity treatment plan along with follow-up of her obesity related diagnoses. Alicia Lowery is on the Category 2 Plan and states she is following her eating plan approximately 80% of the time. Alicia Lowery states she is walking and hula hoop 30 minutes 5 times per week.  Today's visit was #: 31 Starting weight: 246 lbs Starting date: 06/24/2019 Today's weight: 240 lbs Today's date: 05/31/2022 Total lbs lost to date: 6 lbs Total lbs lost since last in-office visit: 1 lb  Interim History:  She will have an  OV with her PCP/Dr. Redmond School at end of Jan 2024.  2024 Health Goals 1) Loss at least 2 lbs by end of Feb 2024 2) Start riding her bike consistently  Subjective:   1. Edema, lower extremity Patient endorses increased lower extremity edema within the last week.   Patient denies shortness of breath, or abdominal swelling.   Bioimpedance water weight stable.   Ms. Alicia Lowery reports consuming more [place] and seafood the last several weeks.  2. Vitamin D deficiency 03/13/2022, vitamin D level 48.7.   Patient is taken ergocalciferol capsules every 14 days. She denies N/V/Muscle Weakness.  3. Type 2 diabetes mellitus with complications [facility]) Patient was previously on Ozempic, frustrated with lack of supply.  She has been off GLP-1 therapy for a while now and not interested in re-starting. PCP manages metformin 500 mg twice daily with meals.  She takes 0 to 1 tablets daily. Lab Results  Component Value Date   HGBA1C 7.1 (H) 03/13/2022   HGBA1C 8.2 (A) 12/05/2021   HGBA1C 7.2 (A) 06/08/2021     Assessment/Plan:   1. Edema, lower extremity Increase water, decrease sodium, elevate legs.  2. Vitamin D deficiency Labs at next office visit.  Refill- Vitamin D, Ergocalciferol, (DRISDOL) 1.25 MG (50000 UNIT) CAPS capsule; TAKE 1 CAPSULE ONCE EVERY 2 WEEKS.  Dispense: 4 capsule; Refill: 0  3. Type 2 diabetes mellitus  with complications ([place]) Check labs at next office visit. Recommend taking Metformin as directed, 500mg  BID.  4. Obesity, current BMI 44.0 Anjeanette is currently in the action stage of change. As such, her goal is to continue with weight loss efforts. She has agreed to the Category 2 Plan.   Exercise goals:  Is.  Behavioral modification strategies: increasing lean protein intake, decreasing simple carbohydrates, meal planning and cooking strategies, keeping healthy foods in the home, and planning for success.  Alicia Lowery has agreed to follow-up with our clinic in 4 weeks. She was informed of the importance of frequent follow-up visits to maximize her success with intensive lifestyle modifications for her multiple health conditions.   Objective:   Blood pressure 119/81, pulse 71, temperature 98.2 F (36.8 C), height 5\' 2"  (1.575 m), weight 240 lb (108.9 kg), last menstrual period 08/02/2016, SpO2 96 %. Body mass index is 43.9 kg/m.  General: Cooperative, alert, well developed, in no acute distress. HEENT: Conjunctivae and lids unremarkable. Cardiovascular: Regular rhythm.  Lungs: Normal work of breathing. Neurologic: No focal deficits.   Lab Results  Component Value Date   CREATININE 0.82 05/02/2021   BUN 14 05/02/2021   NA 141 05/02/2021   K 3.8 05/02/2021   CL 101 05/02/2021   CO2 26 05/02/2021   Lab Results  Component Value Date   ALT 22 05/02/2021   AST 21 05/02/2021   ALKPHOS 72 05/02/2021   BILITOT 0.7 05/02/2021   Lab Results  Component  Value Date   HGBA1C 7.1 (H) 03/13/2022   HGBA1C 8.2 (A) 12/05/2021   HGBA1C 7.2 (A) 06/08/2021   HGBA1C 7.2 (H) 05/02/2021   HGBA1C 6.4 (A) 02/06/2021   Lab Results  Component Value Date   INSULIN 12.5 03/13/2022   INSULIN 13.8 05/02/2021   INSULIN 14.2 03/15/2020   INSULIN 17.6 06/24/2019   Lab Results  Component Value Date   TSH 1.280 06/24/2019   Lab Results  Component Value Date   CHOL 161 05/02/2021   HDL 48  05/02/2021   LDLCALC 93 05/02/2021   TRIG 110 05/02/2021   CHOLHDL 5.4 (H) 06/23/2019   Lab Results  Component Value Date   VD25OH 48.7 03/13/2022   VD25OH 42.2 05/02/2021   VD25OH 55.5 03/15/2020   Lab Results  Component Value Date   WBC 3.7 06/23/2019   HGB 13.1 06/23/2019   HCT 39.4 06/23/2019   MCV 91 06/23/2019   PLT 233 06/23/2019   No results found for: "IRON", "TIBC", "FERRITIN"  Attestation Statements:   Reviewed by clinician on day of visit: allergies, medications, problem list, medical history, surgical history, family history, social history, and previous encounter notes.  I, Davy Pique, RMA, am acting as Location manager for Mina Marble, NP.  I have reviewed the above documentation for accuracy and completeness, and I agree with the above. -  Aliza Moret d. Sharisa Toves, NP-C

## 2022-06-12 ENCOUNTER — Encounter: Payer: Self-pay | Admitting: Family Medicine

## 2022-06-12 ENCOUNTER — Ambulatory Visit: Payer: 59 | Admitting: Family Medicine

## 2022-06-12 ENCOUNTER — Other Ambulatory Visit (HOSPITAL_COMMUNITY): Payer: Self-pay

## 2022-06-12 VITALS — BP 136/78 | HR 67 | Temp 97.7°F | Resp 18 | Ht 62.0 in | Wt 243.6 lb

## 2022-06-12 DIAGNOSIS — M109 Gout, unspecified: Secondary | ICD-10-CM | POA: Diagnosis not present

## 2022-06-12 DIAGNOSIS — E559 Vitamin D deficiency, unspecified: Secondary | ICD-10-CM | POA: Diagnosis not present

## 2022-06-12 DIAGNOSIS — Z0000 Encounter for general adult medical examination without abnormal findings: Secondary | ICD-10-CM

## 2022-06-12 DIAGNOSIS — E118 Type 2 diabetes mellitus with unspecified complications: Secondary | ICD-10-CM

## 2022-06-12 DIAGNOSIS — Z8249 Family history of ischemic heart disease and other diseases of the circulatory system: Secondary | ICD-10-CM

## 2022-06-12 DIAGNOSIS — Z8669 Personal history of other diseases of the nervous system and sense organs: Secondary | ICD-10-CM

## 2022-06-12 DIAGNOSIS — E1169 Type 2 diabetes mellitus with other specified complication: Secondary | ICD-10-CM | POA: Diagnosis not present

## 2022-06-12 DIAGNOSIS — E119 Type 2 diabetes mellitus without complications: Secondary | ICD-10-CM | POA: Diagnosis not present

## 2022-06-12 DIAGNOSIS — I152 Hypertension secondary to endocrine disorders: Secondary | ICD-10-CM | POA: Diagnosis not present

## 2022-06-12 DIAGNOSIS — E785 Hyperlipidemia, unspecified: Secondary | ICD-10-CM

## 2022-06-12 DIAGNOSIS — R600 Localized edema: Secondary | ICD-10-CM

## 2022-06-12 DIAGNOSIS — Z6841 Body Mass Index (BMI) 40.0 and over, adult: Secondary | ICD-10-CM

## 2022-06-12 DIAGNOSIS — Z91199 Patient's noncompliance with other medical treatment and regimen due to unspecified reason: Secondary | ICD-10-CM

## 2022-06-12 DIAGNOSIS — J301 Allergic rhinitis due to pollen: Secondary | ICD-10-CM

## 2022-06-12 DIAGNOSIS — E1159 Type 2 diabetes mellitus with other circulatory complications: Secondary | ICD-10-CM | POA: Diagnosis not present

## 2022-06-12 LAB — POCT GLYCOSYLATED HEMOGLOBIN (HGB A1C): Hemoglobin A1C: 8.3 % — AB (ref 4.0–5.6)

## 2022-06-12 MED ORDER — SEMAGLUTIDE(0.25 OR 0.5MG/DOS) 2 MG/3ML ~~LOC~~ SOPN
1.0000 | PEN_INJECTOR | SUBCUTANEOUS | 0 refills | Status: DC
  Filled 2022-06-12: qty 3, 28d supply, fill #0

## 2022-06-12 MED ORDER — LISINOPRIL-HYDROCHLOROTHIAZIDE 20-12.5 MG PO TABS: 1.0000 | ORAL_TABLET | Freq: Every day | ORAL | 3 refills | Status: DC

## 2022-06-12 MED ORDER — TRULICITY 0.75 MG/0.5ML ~~LOC~~ SOAJ
0.7500 mg | SUBCUTANEOUS | 0 refills | Status: DC
  Filled 2022-06-12 – 2022-06-28 (×5): qty 2, 28d supply, fill #0

## 2022-06-12 MED ORDER — ACCU-CHEK GUIDE VI STRP: 1.0000 | ORAL_STRIP | Freq: Three times a day (TID) | 4 refills | Status: AC

## 2022-06-12 MED ORDER — ROSUVASTATIN CALCIUM 20 MG PO TABS: 20.0000 mg | ORAL_TABLET | Freq: Every day | ORAL | 1 refills | Status: DC

## 2022-06-12 MED ORDER — FUROSEMIDE 20 MG PO TABS: ORAL_TABLET | ORAL | 0 refills | Status: DC

## 2022-06-12 NOTE — Progress Notes (Signed)
Complete physical exam  Patient: Alicia Lowery   DOB: December 02, 1960   62 y.o. Female  MRN: [id number]  Subjective:    Chief Complaint  Patient presents with   Annual Exam    Fasting. No additional concerns.     Alicia Lowery is a 62 y.o. female who presents today for a complete physical exam. She has been going to the medical weight loss and wellness program since 2021 however review of the record indicates she is really made no progress with weight loss.  At 1 point she was also given Ozempic but stopped taking it stating she could not find the medication due to [place] shortages but did not tell me.  She does continue on metformin and is usually taking both pills at 1 time and is having some diarrhea that she seems to be handling fairly well.  She has a vitamin D deficiency.  She continues on lisinopril/HCTZ as well as Crestor.  She does use Lasix on an as-needed basis.  He does complain of intermittent foot pain that usually gets better when she does not have to stand for long periods of time.  Her allergies seem to be under good control.  She does have a history of gout but presently is on no medication for this.  Otherwise her family and social history as well as health maintenance and immunizations was reviewed. Most recent fall risk assessment:    06/12/2022   11:24 AM  [place] in the past year? 0  Number falls in past yr: 0  Injury with Fall? 0  Risk for fall due to : No Fall Risks  Follow up Falls evaluation completed     Most recent depression screenings:    06/12/2022   11:24 AM 02/06/2021    1:50 PM  PHQ 2/9 Scores  PHQ - 2 Score 0 0    Vision:Within last year and Dental: Receives regular dental care    Patient Care Team: Denita Lung, MD as PCP - General (Family Medicine)   Outpatient Medications Prior to Visit  Medication Sig   ACCU-CHEK FASTCLIX LANCETS MISC Use to check blood sugar 3 times daily   aspirin 81 MG tablet Take 81 mg by mouth daily.    blood glucose meter kit and supplies KIT Dispense One Touch Ultra or Verio. Test twice a day.  E11.9.   metFORMIN (GLUCOPHAGE) 500 MG tablet TAKE 1 TABLET BY MOUTH TWICE A DAY WITH MEALS   Vitamin D, Ergocalciferol, (DRISDOL) 1.25 MG (50000 UNIT) CAPS capsule TAKE 1 CAPSULE ONCE EVERY 2 WEEKS.   [DISCONTINUED] furosemide (LASIX) 20 MG tablet TAKE 1 TABLET BY MOUTH EVERY 3 DAYS (Patient not taking: Reported on 06/12/2022)   [DISCONTINUED] glucose blood (ACCU-CHEK GUIDE) test strip 1 each by Other route in the morning, at noon, and at bedtime. Use as instructed to check blood sugar readings three time a day.   [DISCONTINUED] lisinopril-hydrochlorothiazide (ZESTORETIC) 20-12.5 MG tablet TAKE 1 TABLET BY MOUTH EVERY DAY   [DISCONTINUED] rosuvastatin (CRESTOR) 20 MG tablet TAKE 1 TABLET BY MOUTH EVERY DAY   No facility-administered medications prior to visit.    Review of Systems  All other systems reviewed and are negative.         Objective:     BP 136/78   Pulse 67   Temp 97.7 F (36.5 C) (Oral)   Resp 18   Ht 5\' 2"  (1.575 m)   Wt 243 lb 9.6 oz (110.5  kg)   LMP 08/02/2016   SpO2 100% Comment: room air  BMI 44.56 kg/m    Physical Exam  Alert and in no distress. Tympanic membranes and canals are normal. Pharyngeal area is normal. Neck is supple without adenopathy or thyromegaly. Cardiac exam shows a regular sinus rhythm without murmurs or gallops. Lungs are clear to auscultation. Foot exam is recorded and essentially normal but pulses not well-appreciated. Hemoglobin A1c 8.3     Assessment & Plan:    Routine general medical examination at a health care facility - Plan: CBC with Differential/Platelet, Comprehensive metabolic panel, Lipid panel  Hypertension associated with diabetes ([place]) - Plan: CBC with Differential/Platelet, Comprehensive metabolic panel, lisinopril-hydrochlorothiazide (ZESTORETIC) 20-12.5 MG tablet  Seasonal allergic rhinitis due to  pollen  Hyperlipidemia associated with type 2 diabetes mellitus ([place]) - Plan: Lipid panel, rosuvastatin (CRESTOR) 20 MG tablet  Type 2 diabetes mellitus with complications ([place]) - Plan: POCT glycosylated hemoglobin (Hb A1C), VITAMIN D 25 Hydroxy (Vit-D Deficiency, Fractures), CBC with Differential/Platelet, Comprehensive metabolic panel, Lipid panel, glucose blood (ACCU-CHEK GUIDE) test strip, Dulaglutide (TRULICITY) 9.50 DT/2.6ZT SOPN  Class 3 severe obesity with serious comorbidity and body mass index (BMI) of 40.0 to 44.9 in adult, unspecified obesity type (HCC)  Gout, unspecified cause, unspecified chronicity, unspecified site - Plan: Uric Acid  Vitamin D deficiency - Plan: VITAMIN D 25 Hydroxy (Vit-D Deficiency, Fractures)  Obesity, morbid ([place])  Family history of heart disease in female family member before age 43  Type 2 diabetes mellitus without complication, unspecified whether long term insulin use ([place]) - Plan: DISCONTINUED: Semaglutide,0.25 or 0.5MG /DOS, 2 MG/3ML SOPN  Edema, lower extremity - Plan: furosemide (LASIX) 20 MG tablet  Personal history of noncompliance with medical treatment, presenting hazards to health   Immunization History  Administered Date(s) Administered   COVID-19, mRNA, vaccine(Comirnaty)12 years and older 03/01/2022   Influenza, Seasonal, Injecte, Preservative Fre 06/12/2012   Influenza,inj,Quad PF,6+ Mos 03/10/2013, 04/22/2014, 03/04/2015, 12/29/2015, 04/16/2017, 04/28/2018, 02/12/2019, 02/06/2021   Influenza-Unspecified 01/20/2022   PFIZER(Purple Top)SARS-COV-2 Vaccination 07/31/2019, 08/25/2019, 04/03/2020   Pfizer Covid-19 Vaccine Bivalent Booster 78yrs & up 02/06/2021   Pneumococcal Conjugate-13 12/29/2015   Pneumococcal Polysaccharide-23 04/14/2013   Tdap 02/09/2008, 06/11/2018   Zoster Recombinat (Shingrix) 06/23/2019, 12/10/2019    Health Maintenance  Topic Date Due   HIV Screening  Never done   PAP SMEAR-Modifier  10/30/2021   FOOT  EXAM  02/06/2022   OPHTHALMOLOGY EXAM  04/19/2022   Diabetic kidney evaluation - eGFR measurement  05/02/2022   COLONOSCOPY (Pts 45-29yrs Insurance coverage will need to be confirmed)  07/25/2022   HEMOGLOBIN A1C  09/11/2022   Diabetic kidney evaluation - Urine ACR  12/06/2022   MAMMOGRAM  05/18/2024   DTaP/Tdap/Td (3 - Td or Tdap) 06/11/2028   INFLUENZA VACCINE  Completed   COVID-19 Vaccine  Completed   Hepatitis C Screening  Completed   Zoster Vaccines- Shingrix  Completed   HPV VACCINES  Aged Out    Discussed health benefits of physical activity, and encouraged her to engage in regular exercise appropriate for her age and condition.  Problem List Items Addressed This Visit     Allergic rhinitis (Chronic)   Family history of heart disease in female family member before age 68 (Chronic)   Hypertension associated with diabetes ([place]) (Chronic)   Relevant Medications   rosuvastatin (CRESTOR) 20 MG tablet   lisinopril-hydrochlorothiazide (ZESTORETIC) 20-12.5 MG tablet   furosemide (LASIX) 20 MG tablet   Dulaglutide (TRULICITY) 2.45 YK/9.9IP SOPN   Other  Relevant Orders   CBC with Differential/Platelet   Comprehensive metabolic panel   Obesity, morbid (HCC) (Chronic)   Relevant Medications   Dulaglutide (TRULICITY) 0.86 VH/8.4ON SOPN   Class 3 severe obesity with serious comorbidity and body mass index (BMI) of 40.0 to 44.9 in adult [facility])   Relevant Medications   Dulaglutide (TRULICITY) 6.29 BM/8.4XL SOPN   Edema, lower extremity   Relevant Medications   furosemide (LASIX) 20 MG tablet   Gout   Relevant Orders   Uric Acid   Hyperlipidemia associated with type 2 diabetes mellitus (HCC)   Relevant Medications   rosuvastatin (CRESTOR) 20 MG tablet   lisinopril-hydrochlorothiazide (ZESTORETIC) 20-12.5 MG tablet   furosemide (LASIX) 20 MG tablet   Dulaglutide (TRULICITY) 2.44 WN/0.2VO SOPN   Other Relevant Orders   Lipid panel   Type 2 diabetes mellitus with complications  (HCC)   Relevant Medications   rosuvastatin (CRESTOR) 20 MG tablet   lisinopril-hydrochlorothiazide (ZESTORETIC) 20-12.5 MG tablet   glucose blood (ACCU-CHEK GUIDE) test strip   Dulaglutide (TRULICITY) 5.36 UY/4.0HK SOPN   Other Relevant Orders   POCT glycosylated hemoglobin (Hb A1C)   VITAMIN D 25 Hydroxy (Vit-D Deficiency, Fractures)   CBC with Differential/Platelet   Comprehensive metabolic panel   Lipid panel   Vitamin D deficiency   Relevant Orders   VITAMIN D 25 Hydroxy (Vit-D Deficiency, Fractures)   Other Visit Diagnoses     Routine general medical examination at a health care facility    -  Primary   Relevant Orders   CBC with Differential/Platelet   Comprehensive metabolic panel   Lipid panel   Type 2 diabetes mellitus without complication, unspecified whether long term insulin use (HCC)       Relevant Medications   rosuvastatin (CRESTOR) 20 MG tablet   lisinopril-hydrochlorothiazide (ZESTORETIC) 20-12.5 MG tablet   Dulaglutide (TRULICITY) 7.42 VZ/5.6LO SOPN   Personal history of noncompliance with medical treatment, presenting hazards to health         I had a long discussion with her concerning taking better care of herself and the fact that the medical weight loss and wellness has not had any positive effects on her explaining that it is her responsibility to be more proactive in caring for herself.  I will place her on Trulicity as Ozempic is not covered with her insurance.  She is to call me in 1 month to let me know how she is tolerating this medication. Return in about 3 months (around 09/11/2022).     Jill Alexanders, MD

## 2022-06-12 NOTE — Patient Instructions (Signed)
Am calling in a month supply.  Call me after about 3 weeks to let me know how you are doing and we will go to the next higher dose and we will keep doing that monthly and I will see you in 3 months contact will call the stuff and 12th blood work on you renew your medications

## 2022-06-13 LAB — CBC WITH DIFFERENTIAL/PLATELET
Basophils Absolute: 0 10*3/uL (ref 0.0–0.2)
Basos: 1 %
EOS (ABSOLUTE): 0.2 10*3/uL (ref 0.0–0.4)
Eos: 4 %
Hematocrit: 40.2 % (ref 34.0–46.6)
Hemoglobin: 13.3 g/dL (ref 11.1–15.9)
Immature Grans (Abs): 0 10*3/uL (ref 0.0–0.1)
Immature Granulocytes: 0 %
Lymphocytes Absolute: 1.7 10*3/uL (ref 0.7–3.1)
Lymphs: 45 %
MCH: 30.3 pg (ref 26.6–33.0)
MCHC: 33.1 g/dL (ref 31.5–35.7)
MCV: 92 fL (ref 79–97)
Monocytes Absolute: 0.4 10*3/uL (ref 0.1–0.9)
Monocytes: 11 %
Neutrophils Absolute: 1.4 10*3/uL (ref 1.4–7.0)
Neutrophils: 39 %
Platelets: 211 10*3/uL (ref 150–450)
RBC: 4.39 x10E6/uL (ref 3.77–5.28)
RDW: 12.6 % (ref 11.7–15.4)
WBC: 3.7 10*3/uL (ref 3.4–10.8)

## 2022-06-13 LAB — URIC ACID: Uric Acid: 8.8 mg/dL — ABNORMAL HIGH (ref 3.0–7.2)

## 2022-06-13 LAB — LIPID PANEL
Chol/HDL Ratio: 3.4 ratio (ref 0.0–4.4)
Cholesterol, Total: 152 mg/dL (ref 100–199)
HDL: 45 mg/dL (ref >39)
LDL Chol Calc (NIH): 86 mg/dL (ref 0–99)
Triglycerides: 116 mg/dL (ref 0–149)
VLDL Cholesterol Cal: 21 mg/dL (ref 5–40)

## 2022-06-13 LAB — COMPREHENSIVE METABOLIC PANEL
ALT: 19 IU/L (ref 0–32)
AST: 16 IU/L (ref 0–40)
Albumin/Globulin Ratio: 1.6 (ref 1.2–2.2)
Albumin: 4.4 g/dL (ref 3.9–4.9)
Alkaline Phosphatase: 73 IU/L (ref 44–121)
BUN/Creatinine Ratio: 16 (ref 12–28)
BUN: 14 mg/dL (ref 8–27)
Bilirubin Total: 0.7 mg/dL (ref 0.0–1.2)
CO2: 24 mmol/L (ref 20–29)
Calcium: 10.2 mg/dL (ref 8.7–10.3)
Chloride: 103 mmol/L (ref 96–106)
Creatinine, Ser: 0.89 mg/dL (ref 0.57–1.00)
Globulin, Total: 2.8 g/dL (ref 1.5–4.5)
Glucose: 109 mg/dL — ABNORMAL HIGH (ref 70–99)
Potassium: 4.2 mmol/L (ref 3.5–5.2)
Sodium: 142 mmol/L (ref 134–144)
Total Protein: 7.2 g/dL (ref 6.0–8.5)
eGFR: 73 mL/min/{1.73_m2} (ref >59)

## 2022-06-13 LAB — VITAMIN D 25 HYDROXY (VIT D DEFICIENCY, FRACTURES): Vit D, 25-Hydroxy: 41.8 ng/mL (ref 30.0–100.0)

## 2022-06-14 ENCOUNTER — Other Ambulatory Visit (HOSPITAL_COMMUNITY): Payer: Self-pay

## 2022-06-26 ENCOUNTER — Encounter: Payer: Self-pay | Admitting: Family Medicine

## 2022-06-26 ENCOUNTER — Other Ambulatory Visit (HOSPITAL_COMMUNITY): Payer: Self-pay

## 2022-06-27 ENCOUNTER — Other Ambulatory Visit (HOSPITAL_COMMUNITY): Payer: Self-pay

## 2022-06-28 ENCOUNTER — Encounter (INDEPENDENT_AMBULATORY_CARE_PROVIDER_SITE_OTHER): Payer: Self-pay | Admitting: Adult Health

## 2022-06-28 ENCOUNTER — Telehealth: Payer: Self-pay | Admitting: Family Medicine

## 2022-06-28 ENCOUNTER — Ambulatory Visit (INDEPENDENT_AMBULATORY_CARE_PROVIDER_SITE_OTHER): Payer: 59 | Admitting: Adult Health

## 2022-06-28 ENCOUNTER — Other Ambulatory Visit (HOSPITAL_COMMUNITY): Payer: Self-pay

## 2022-06-28 VITALS — BP 132/79 | HR 63 | Temp 97.9°F | Ht 62.0 in | Wt 235.0 lb

## 2022-06-28 DIAGNOSIS — E559 Vitamin D deficiency, unspecified: Secondary | ICD-10-CM

## 2022-06-28 DIAGNOSIS — Z7984 Long term (current) use of oral hypoglycemic drugs: Secondary | ICD-10-CM | POA: Diagnosis not present

## 2022-06-28 DIAGNOSIS — I152 Hypertension secondary to endocrine disorders: Secondary | ICD-10-CM | POA: Diagnosis not present

## 2022-06-28 DIAGNOSIS — E669 Obesity, unspecified: Secondary | ICD-10-CM

## 2022-06-28 DIAGNOSIS — E1165 Type 2 diabetes mellitus with hyperglycemia: Secondary | ICD-10-CM

## 2022-06-28 DIAGNOSIS — Z6841 Body Mass Index (BMI) 40.0 and over, adult: Secondary | ICD-10-CM

## 2022-06-28 DIAGNOSIS — E1159 Type 2 diabetes mellitus with other circulatory complications: Secondary | ICD-10-CM | POA: Diagnosis not present

## 2022-06-28 DIAGNOSIS — E66813 Obesity, class 3: Secondary | ICD-10-CM

## 2022-06-28 MED ORDER — VITAMIN D (ERGOCALCIFEROL) 1.25 MG (50000 UNIT) PO CAPS: ORAL_CAPSULE | ORAL | 0 refills | Status: DC

## 2022-06-28 NOTE — Telephone Encounter (Signed)
P.A. was denied due to does not cover services that are not medically necessary, so it was denied in error.  Called [facility] T# [phone] and denial was in error, was glitch in system, redid P.A. over the phone and was approved til 06/29/23 PA# [id number], Left patient another message.

## 2022-06-28 NOTE — Telephone Encounter (Signed)
Left message for pt

## 2022-06-28 NOTE — Progress Notes (Addendum)
Chief Complaint:   OBESITY Alicia Lowery is here to discuss her progress with her obesity treatment plan along with follow-up of her obesity related diagnoses. Alicia Lowery is on the Category 2 Plan and states she is following her eating plan approximately 75% of the time.  Alicia Lowery states she is walking 30 minutes 3 times per week.  Today's visit was #: 44 Starting weight: 246 lbs Starting date: 06/24/2019 Today's weight: 235 lbs Today's date: 06/28/2022 Total lbs lost to date: 11 lbs Total lbs lost since last in-office visit: - 5 lbs  Interim History:  06/12/2022 OV with PCP/Dr. Everlene Balls Labs were completed- A1c not well controlled. PCP started her on Trulicity- awaiting insurance approval.  She works 4 hours [employer].  Of Note- Reviewed Cat 2 Meal plan and meal planning/prepping >20 mins  Subjective:   1. Uncontrolled type 2 diabetes mellitus with hyperglycemia (HCC) Lab Results  Component Value Date   HGBA1C 8.3 (A) 06/12/2022   HGBA1C 7.1 (H) 03/13/2022   HGBA1C 8.2 (A) 12/05/2021   UNCONTROLLED Currently on Metformin  BID with meals. PCP recently added on Trulicity- has yet to start. Discussed at [facility] the importance of reducing sugar/CHO and increasing lean protein. Reviewed tenets of Cat 2 meal for >20 minutes.  2. Hypertension associated with diabetes (HCC) She denies acute cardiac sx's. Denies dyspnea or CP when walking. She denies tobacco/vape use. PCP manages Zestoretic 20/12.5mg  QD  3. Vitamin D deficiency  Latest Reference Range & Units 06/12/22 12:00  Vitamin D, 25-Hydroxy 30.0 - 100.0 ng/mL 41.8  Bi-weekly Ergocalciferol- denies N/V/Muscle Weakness  Assessment/Plan:   1. Uncontrolled type 2 diabetes mellitus with hyperglycemia (HCC) Start Trulicity per PCP  2. Hypertension associated with diabetes (HCC) Continue daily Zestoretic 20/12.5mg  QD  3. Vitamin D deficiency Refill Ergocalciferol 50,000 IU once every other week Disp 4 RF  0  4. Obesity current BMI 42.97  Stevey is not currently in the action stage of change. As such, her goal is to get back to weightloss efforts . She has agreed to the Category 2 Plan.   Handouts:  Cat 2 meal plan, Grocert List, 100/200 Cal Snack List, Additional Breakfast Options  Exercise goals: All adults should avoid inactivity. Some physical activity is better than none, and adults who participate in any amount of physical activity gain some health benefits.  Behavioral modification strategies: increasing lean protein intake, decreasing simple carbohydrates, increasing vegetables, increasing water intake, decreasing sodium intake, increasing high fiber foods, decreasing eating out, meal planning and cooking strategies, keeping healthy foods in the home, better snacking choices, emotional eating strategies, and planning for success.  Alicia Lowery has agreed to follow-up with our clinic in 4 weeks. She was informed of the importance of frequent follow-up visits to maximize her success with intensive lifestyle modifications for her multiple health conditions.   CHECK IC- PT AWARE TO ARRIVE 30 MINS AND FASTING  Objective:   Blood pressure 132/79, pulse 63, temperature 97.9 F (36.6 C), height  (1.575 m), weight 235 lb (106.6 kg), last menstrual period 08/02/2016, SpO2 98 %. Body mass index is 42.98 kg/m.  General: Cooperative, alert, well developed, in no acute distress. HEENT: Conjunctivae and lids unremarkable. Cardiovascular: Regular rhythm.  Lungs: Normal work of breathing. Neurologic: No focal deficits.   Lab Results  Component Value Date   CREATININE 0.89 06/12/2022   BUN 14 06/12/2022   NA 142 06/12/2022   K 4.2 06/12/2022   CL 103 06/12/2022   CO2 24  06/12/2022   Lab Results  Component Value Date   ALT 19 06/12/2022   AST 16 06/12/2022   ALKPHOS 73 06/12/2022   BILITOT 0.7 06/12/2022   Lab Results  Component Value Date   HGBA1C 8.3 (A) 06/12/2022   HGBA1C 7.1  (H) 03/13/2022   HGBA1C 8.2 (A) 12/05/2021   HGBA1C 7.2 (A) 06/08/2021   HGBA1C 7.2 (H) 05/02/2021   Lab Results  Component Value Date   INSULIN 12.5 03/13/2022   INSULIN 13.8 05/02/2021   INSULIN 14.2 03/15/2020   INSULIN 17.6 06/24/2019   Lab Results  Component Value Date   TSH 1.280 06/24/2019   Lab Results  Component Value Date   CHOL 152 06/12/2022   HDL 45 06/12/2022   LDLCALC 86 06/12/2022   TRIG 116 06/12/2022   CHOLHDL 3.4 06/12/2022   Lab Results  Component Value Date   VD25OH 41.8 06/12/2022   VD25OH 48.7 03/13/2022   VD25OH 42.2 05/02/2021   Lab Results  Component Value Date   WBC 3.7 06/12/2022   HGB 13.3 06/12/2022   HCT 40.2 06/12/2022   MCV 92 06/12/2022   PLT 211 06/12/2022   No results found for: "IRON", "TIBC", "FERRITIN"  Attestation Statements:   Reviewed by clinician on day of visit: allergies, medications, problem list, medical history, surgical history, family history, social history, and previous encounter notes.   I have reviewed the above documentation for accuracy and completeness, and I agree with the above. -  [name] Paske d. Fordyce Lepak, NP-C

## 2022-06-28 NOTE — Telephone Encounter (Signed)
P.A. Danelle Berry completed

## 2022-06-30 NOTE — Telephone Encounter (Signed)
P.A. approved til 06/29/23

## 2022-07-12 ENCOUNTER — Telehealth: Payer: Self-pay | Admitting: Family Medicine

## 2022-07-12 NOTE — Telephone Encounter (Signed)
Pt called and states that she has been on Trulicity for about 2 weeks and so far she is doing good without any issues. Pt states she was to call and let JCL know how she was doing.

## 2022-07-12 NOTE — Telephone Encounter (Signed)
Left message for patient

## 2022-07-23 NOTE — Telephone Encounter (Signed)
P.A. approved til 06/29/23 

## 2022-07-26 ENCOUNTER — Telehealth: Payer: Self-pay

## 2022-07-26 NOTE — Telephone Encounter (Signed)
Tried calling patient. Left message to call back. 

## 2022-07-26 NOTE — Telephone Encounter (Signed)
Pt called to report her satisfaction with Trulicity. Her A1C is good and she does not have any GI symptoms.

## 2022-07-27 ENCOUNTER — Telehealth: Payer: Self-pay

## 2022-07-27 NOTE — Telephone Encounter (Signed)
Pt returned call. I provided the information from Dr. Redmond School.

## 2022-07-27 NOTE — Telephone Encounter (Signed)
Left detailed message on what JCL said

## 2022-07-30 ENCOUNTER — Encounter (INDEPENDENT_AMBULATORY_CARE_PROVIDER_SITE_OTHER): Payer: Self-pay | Admitting: Adult Health

## 2022-07-30 ENCOUNTER — Ambulatory Visit (INDEPENDENT_AMBULATORY_CARE_PROVIDER_SITE_OTHER): Payer: 59 | Admitting: Adult Health

## 2022-07-30 ENCOUNTER — Other Ambulatory Visit: Payer: Self-pay | Admitting: Family Medicine

## 2022-07-30 ENCOUNTER — Other Ambulatory Visit (HOSPITAL_COMMUNITY): Payer: Self-pay

## 2022-07-30 DIAGNOSIS — E118 Type 2 diabetes mellitus with unspecified complications: Secondary | ICD-10-CM

## 2022-07-30 DIAGNOSIS — E669 Obesity, unspecified: Secondary | ICD-10-CM

## 2022-07-30 DIAGNOSIS — Z6841 Body Mass Index (BMI) 40.0 and over, adult: Secondary | ICD-10-CM

## 2022-07-30 DIAGNOSIS — E559 Vitamin D deficiency, unspecified: Secondary | ICD-10-CM | POA: Diagnosis not present

## 2022-07-30 DIAGNOSIS — R0602 Shortness of breath: Secondary | ICD-10-CM

## 2022-07-30 DIAGNOSIS — Z7985 Long-term (current) use of injectable non-insulin antidiabetic drugs: Secondary | ICD-10-CM | POA: Diagnosis not present

## 2022-07-30 MED ORDER — TRULICITY 0.75 MG/0.5ML ~~LOC~~ SOAJ
0.7500 mg | SUBCUTANEOUS | 0 refills | Status: DC
  Filled 2022-07-30: qty 2, 28d supply, fill #0

## 2022-07-30 MED ORDER — VITAMIN D (ERGOCALCIFEROL) 1.25 MG (50000 UNIT) PO CAPS: ORAL_CAPSULE | ORAL | 0 refills | Status: DC

## 2022-07-30 NOTE — Progress Notes (Signed)
WEIGHT SUMMARY AND BIOMETRICS  Vitals Temp: 98.2 F (36.8 C) BP: 111/70 Pulse Rate: 70 SpO2: 96 %   Anthropometric Measurements Height: 5\' 2"  (1.575 m) Weight: 230 lb (104.3 kg) BMI (Calculated): 42.06 Weight at Last Visit: 235lb Weight Lost Since Last Visit: 5lb Weight Gained Since Last Visit: 0 Starting Weight: 246lb Total Weight Loss (lbs): 16 lb (7.258 kg)   Body Composition  Body Fat %: 49.7 % Fat Mass (lbs): 114.6 lbs Muscle Mass (lbs): 110 lbs Total Body Water (lbs): 90.8 lbs Visceral Fat Rating : 17   Other Clinical Data RMR: 1570 Fasting: yes Labs: Yes Today's Visit #: 99 Starting Date: 06/24/19    Chief Complaint:   OBESITY Alicia Lowery is here to discuss her progress with her obesity treatment plan. She is on the the Category 2 Plan and states she is following her eating plan approximately 90 % of the time.  She states she is exercising walking 30 minutes 3-4 times per week.   Interim History:  Her PCP/Dr. Redmond School started her on Trulicity [id number] mg - she began injection therapy 06/27/22- has had 4 injections. Denies mass in neck, dysphagia, dyspepsia, persistent hoarseness, abdominal pain, or N/V/C   06/24/2019 RMR 1276- slower than expected 11/24/2019 RMR 1855- faster than expected 07/30/2022 RMR 1570- faster then expected Metabolism decreased by 285 calories  Subjective:   1. Shortness of breath She endorses dyspnea with extreme exertion, denies CP  06/24/2019 RMR 1276- slower than expected 11/24/2019 RMR 1855- faster than expected 07/30/2022 RMR 1570- faster then expected Metabolism decreased by 285 calories  2. Type 2 diabetes mellitus with complications (HCC) Lab Results  Component Value Date   HGBA1C 8.3 (A) 06/12/2022   HGBA1C 7.1 (H) 03/13/2022   HGBA1C 8.2 (A) 12/05/2021   She started Trulicity 0.75mg  once weekly injection Denies mass in neck, dysphagia, dyspepsia, persistent hoarseness, abdominal pain, or N/V/C  She reports  resolution of loose stools since starting GLP-1 therapy. She reports 2-3 well formed stools per day.  3. Vitamin D deficiency  Latest Reference Range & Units 06/12/22 12:00  Vitamin D, 25-Hydroxy 30.0 - 100.0 ng/mL 41.8  She is on Ergocalciferol 50,000 IU every 14 days  Assessment/Plan:   1. Shortness of breath Check IC Increase daily walking distance and intensity   2. Type 2 diabetes mellitus with complications (HCC) Continue GLP-1 therapy Increase daily walking.  3. Vitamin D deficiency Refill and increase - Vitamin D, Ergocalciferol, (DRISDOL) 1.25 MG (50000 UNIT) CAPS capsule; TAKE 1 CAPSULE ONCE EVERY WEEK  Dispense: 4 capsule; Refill: 0  5. Obesity current BMI 42.06  Alicia Lowery is currently in the action stage of change. As such, her goal is to continue with weight loss efforts. She has agreed to the Category 2 Plan.   Exercise goals: All adults should avoid inactivity. Some physical activity is better than none, and adults who participate in any amount of physical activity gain some health benefits. Adults should also include muscle-strengthening activities that involve all major muscle groups on 2 or more days a week. Walk daily  Behavioral modification strategies: increasing lean protein intake, decreasing simple carbohydrates, increasing vegetables, increasing water intake, no skipping meals, meal planning and cooking strategies, keeping healthy foods in the home, and planning for success.  Alicia Lowery has agreed to follow-up with our clinic in 4 weeks. She was informed of the importance of frequent follow-up visits to maximize her success with intensive lifestyle modifications for her multiple health conditions.   Objective:  Blood pressure 111/70, pulse 70, temperature 98.2 F (36.8 C), height 5\' 2"  (1.575 m), weight 230 lb (104.3 kg), last menstrual period 08/02/2016, SpO2 96 %. Body mass index is 42.07 kg/m.  General: Cooperative, alert, well developed, in no acute  distress. HEENT: Conjunctivae and lids unremarkable. Cardiovascular: Regular rhythm.  Lungs: Normal work of breathing. Neurologic: No focal deficits.   Lab Results  Component Value Date   CREATININE 0.89 06/12/2022   BUN 14 06/12/2022   NA 142 06/12/2022   K 4.2 06/12/2022   CL 103 06/12/2022   CO2 24 06/12/2022   Lab Results  Component Value Date   ALT 19 06/12/2022   AST 16 06/12/2022   ALKPHOS 73 06/12/2022   BILITOT 0.7 06/12/2022   Lab Results  Component Value Date   HGBA1C 8.3 (A) 06/12/2022   HGBA1C 7.1 (H) 03/13/2022   HGBA1C 8.2 (A) 12/05/2021   HGBA1C 7.2 (A) 06/08/2021   HGBA1C 7.2 (H) 05/02/2021   Lab Results  Component Value Date   INSULIN 12.5 03/13/2022   INSULIN 13.8 05/02/2021   INSULIN 14.2 03/15/2020   INSULIN 17.6 06/24/2019   Lab Results  Component Value Date   TSH 1.280 06/24/2019   Lab Results  Component Value Date   CHOL 152 06/12/2022   HDL 45 06/12/2022   LDLCALC 86 06/12/2022   TRIG 116 06/12/2022   CHOLHDL 3.4 06/12/2022   Lab Results  Component Value Date   VD25OH 41.8 06/12/2022   VD25OH 48.7 03/13/2022   VD25OH 42.2 05/02/2021   Lab Results  Component Value Date   WBC 3.7 06/12/2022   HGB 13.3 06/12/2022   HCT 40.2 06/12/2022   MCV 92 06/12/2022   PLT 211 06/12/2022   No results found for: "IRON", "TIBC", "FERRITIN"  Attestation Statements:   Reviewed by clinician on day of visit: allergies, medications, problem list, medical history, surgical history, family history, social history, and previous encounter notes.  I have reviewed the above documentation for accuracy and completeness, and I agree with the above. -  Alicia Lowery d. Phuong Moffatt, NP-C

## 2022-08-28 ENCOUNTER — Other Ambulatory Visit (INDEPENDENT_AMBULATORY_CARE_PROVIDER_SITE_OTHER): Payer: Self-pay | Admitting: Adult Health

## 2022-08-28 DIAGNOSIS — E559 Vitamin D deficiency, unspecified: Secondary | ICD-10-CM

## 2022-08-29 ENCOUNTER — Other Ambulatory Visit (HOSPITAL_COMMUNITY): Payer: Self-pay

## 2022-08-29 ENCOUNTER — Ambulatory Visit (INDEPENDENT_AMBULATORY_CARE_PROVIDER_SITE_OTHER): Payer: 59 | Admitting: Adult Health

## 2022-08-29 ENCOUNTER — Encounter (INDEPENDENT_AMBULATORY_CARE_PROVIDER_SITE_OTHER): Payer: Self-pay | Admitting: Adult Health

## 2022-08-29 ENCOUNTER — Other Ambulatory Visit: Payer: Self-pay | Admitting: Family Medicine

## 2022-08-29 VITALS — BP 120/77 | HR 71 | Temp 98.1°F | Ht 62.0 in | Wt 234.0 lb

## 2022-08-29 DIAGNOSIS — Z7985 Long-term (current) use of injectable non-insulin antidiabetic drugs: Secondary | ICD-10-CM | POA: Diagnosis not present

## 2022-08-29 DIAGNOSIS — E118 Type 2 diabetes mellitus with unspecified complications: Secondary | ICD-10-CM

## 2022-08-29 DIAGNOSIS — E669 Obesity, unspecified: Secondary | ICD-10-CM

## 2022-08-29 DIAGNOSIS — E1165 Type 2 diabetes mellitus with hyperglycemia: Secondary | ICD-10-CM

## 2022-08-29 DIAGNOSIS — E559 Vitamin D deficiency, unspecified: Secondary | ICD-10-CM

## 2022-08-29 DIAGNOSIS — Z6841 Body Mass Index (BMI) 40.0 and over, adult: Secondary | ICD-10-CM

## 2022-08-29 MED ORDER — TRULICITY 0.75 MG/0.5ML ~~LOC~~ SOAJ
0.7500 mg | SUBCUTANEOUS | 0 refills | Status: DC
  Filled 2022-08-29: qty 2, 28d supply, fill #0

## 2022-08-29 MED ORDER — VITAMIN D (ERGOCALCIFEROL) 1.25 MG (50000 UNIT) PO CAPS: ORAL_CAPSULE | ORAL | 0 refills | Status: DC

## 2022-08-29 NOTE — Telephone Encounter (Signed)
Does dosage need to be increased? Patient was seen today by healthy weight and wellness provider [facility], and was told to follow up with PCP about increasing dose.

## 2022-08-29 NOTE — Progress Notes (Signed)
WEIGHT SUMMARY AND BIOMETRICS  Vitals Temp: 98.1 F (36.7 C) BP: 120/77 Pulse Rate: 71 SpO2: 97 %   Anthropometric Measurements Height:  (1.575 m) Weight: 234 lb (106.1 kg) BMI (Calculated): 42.79 Weight at Last Visit: 230lb Weight Lost Since Last Visit: 0 Weight Gained Since Last Visit: 4lb Starting Weight: 246lb Total Weight Loss (lbs): 12 lb (5.443 kg)   Body Composition  Body Fat %: 51.2 % Fat Mass (lbs): 120 lbs Muscle Mass (lbs): 108.4 lbs Visceral Fat Rating : 18   Other Clinical Data Fasting: Yes Labs: No Today's Visit #: 1 Starting Date: 06/24/19    Chief Complaint:   OBESITY Alicia Lowery is here to discuss her progress with her obesity treatment plan. She is on the the Category 2 Plan and states she is following her eating plan approximately 95 % of the time.  She states she is exercising walking 30 minutes 5 times per week.   Interim History:  Alicia Lowery reports consuming an entire bag of Twizzler then developed HA, she checked PP CBG: 240. Her usual readings: Fasting CBG: High 90s  PP CGB: 110-120s  We checked nutrition information on Twizzler, estimated: She consumed between 1280-1600 cal and between 288- 360g CHO  Subjective:   1. Uncontrolled type 2 diabetes mellitus with hyperglycemia Lab Results  Component Value Date   HGBA1C 8.3 (A) 06/12/2022   HGBA1C 7.1 (H) 03/13/2022   HGBA1C 8.2 (A) 12/05/2021   Dr. Linna Caprice started her on Trulicity mid- Feb She is still on loading dose- 0.75mg  once weekly injection. Denies mass in neck, dysphagia, dyspepsia, persistent hoarseness, abdominal pain, or N/V/C   2. Vitamin D deficiency  Latest Reference Range & Units 06/12/22 12:00  Vitamin D, 25-Hydroxy 30.0 - 100.0 ng/mL 41.8  07/30/2022 Ergocalciferol increased from bi-weekly to weekly therapy. She endorses increased energy levels.   Assessment/Plan:   1. Uncontrolled type 2 diabetes mellitus with hyperglycemia F/U with PCP about  increasing dose. DECREASE CHO intake  2. Vitamin D deficiency Refill - Vitamin D, Ergocalciferol, (DRISDOL) 1.25 MG (50000 UNIT) CAPS capsule; TAKE 1 CAPSULE ONCE EVERY WEEK  Dispense: 4 capsule; Refill: 0  3. Obesity current BMI 42.06  Alicia Lowery is currently in the action stage of change. As such, her goal is to continue with weight loss efforts. She has agreed to the Category 2 Plan.   Exercise goals: All adults should avoid inactivity. Some physical activity is better than none, and adults who participate in any amount of physical activity gain some health benefits. For additional and more extensive health benefits, adults should increase their aerobic physical activity to 300 minutes (5 hours) a week of moderate-intensity, or 150 minutes a week of vigorous-intensity aerobic physical activity, or an equivalent combination of moderate- and vigorous-intensity activity. Additional health benefits are gained by engaging in physical activity beyond this amount.  Adults should also include muscle-strengthening activities that involve all major muscle groups on 2 or more days a week.  Behavioral modification strategies: increasing lean protein intake, decreasing simple carbohydrates, increasing vegetables, increasing water intake, meal planning and cooking strategies, and planning for success.  Alicia Lowery has agreed to follow-up with our clinic in 4 weeks. She was informed of the importance of frequent follow-up visits to maximize her success with intensive lifestyle modifications for her multiple health conditions.   Objective:   Blood pressure 120/77, pulse 71, temperature 98.1 F (36.7 C), height  (1.575 m), weight 234 lb (106.1 kg), last menstrual period 08/02/2016,  SpO2 97 %. Body mass index is 42.8 kg/m.  General: Cooperative, alert, well developed, in no acute distress. HEENT: Conjunctivae and lids unremarkable. Cardiovascular: Regular rhythm.  Lungs: Normal work of  breathing. Neurologic: No focal deficits.   Lab Results  Component Value Date   CREATININE 0.89 06/12/2022   BUN 14 06/12/2022   NA 142 06/12/2022   K 4.2 06/12/2022   CL 103 06/12/2022   CO2 24 06/12/2022   Lab Results  Component Value Date   ALT 19 06/12/2022   AST 16 06/12/2022   ALKPHOS 73 06/12/2022   BILITOT 0.7 06/12/2022   Lab Results  Component Value Date   HGBA1C 8.3 (A) 06/12/2022   HGBA1C 7.1 (H) 03/13/2022   HGBA1C 8.2 (A) 12/05/2021   HGBA1C 7.2 (A) 06/08/2021   HGBA1C 7.2 (H) 05/02/2021   Lab Results  Component Value Date   INSULIN 12.5 03/13/2022   INSULIN 13.8 05/02/2021   INSULIN 14.2 03/15/2020   INSULIN 17.6 06/24/2019   Lab Results  Component Value Date   TSH 1.280 06/24/2019   Lab Results  Component Value Date   CHOL 152 06/12/2022   HDL 45 06/12/2022   LDLCALC 86 06/12/2022   TRIG 116 06/12/2022   CHOLHDL 3.4 06/12/2022   Lab Results  Component Value Date   VD25OH 41.8 06/12/2022   VD25OH 48.7 03/13/2022   VD25OH 42.2 05/02/2021   Lab Results  Component Value Date   WBC 3.7 06/12/2022   HGB 13.3 06/12/2022   HCT 40.2 06/12/2022   MCV 92 06/12/2022   PLT 211 06/12/2022   No results found for: "IRON", "TIBC", "FERRITIN"  Attestation Statements:   Reviewed by clinician on day of visit: allergies, medications, problem list, medical history, surgical history, family history, social history, and previous encounter notes.  I have reviewed the above documentation for accuracy and completeness, and I agree with the above. -  Makisha Marrin d. Treyce Spillers, NP-C

## 2022-09-11 ENCOUNTER — Encounter: Payer: Self-pay | Admitting: Family Medicine

## 2022-09-11 ENCOUNTER — Ambulatory Visit: Payer: 59 | Admitting: Family Medicine

## 2022-09-11 VITALS — BP 142/90 | HR 62 | Temp 97.9°F | Resp 16 | Wt 249.8 lb

## 2022-09-11 DIAGNOSIS — I152 Hypertension secondary to endocrine disorders: Secondary | ICD-10-CM

## 2022-09-11 DIAGNOSIS — E785 Hyperlipidemia, unspecified: Secondary | ICD-10-CM | POA: Diagnosis not present

## 2022-09-11 DIAGNOSIS — E1159 Type 2 diabetes mellitus with other circulatory complications: Secondary | ICD-10-CM

## 2022-09-11 DIAGNOSIS — E118 Type 2 diabetes mellitus with unspecified complications: Secondary | ICD-10-CM

## 2022-09-11 DIAGNOSIS — E1169 Type 2 diabetes mellitus with other specified complication: Secondary | ICD-10-CM

## 2022-09-11 DIAGNOSIS — Z1211 Encounter for screening for malignant neoplasm of colon: Secondary | ICD-10-CM | POA: Diagnosis not present

## 2022-09-11 LAB — POCT GLYCOSYLATED HEMOGLOBIN (HGB A1C): Hemoglobin A1C: 6.7 % — AB (ref 4.0–5.6)

## 2022-09-11 MED ORDER — TRULICITY 1.5 MG/0.5ML ~~LOC~~ SOAJ: 1.5000 mg | SUBCUTANEOUS | 1 refills | Status: DC

## 2022-09-11 NOTE — Patient Instructions (Signed)
Call me in 1 month to let me know how you are doing on the higher strength in terms of symptoms

## 2022-09-11 NOTE — Progress Notes (Signed)
Subjective:    Patient ID: Alicia Lowery, female    DOB: [birth year], 62 y.o.   MRN: [id number]  Alicia Lowery is a 62 y.o. female who presents for follow-up of Type 2 diabetes mellitus.  Home blood sugar records: fasting range: 100 Current symptoms/problems include none and have been stable. Daily foot checks: yes   Any foot concerns: yes. Foot pain How often blood sugars checked: Exercise: Home exercise routine includes walking 3-4 times a week. Diet: regular diet She continues to go to medical weight loss and wellness.  She has lost 14 pounds since she started.  She is on Trulicity 0.75 and metformin.  She is doing well on Crestor and is also taking lisinopril/HCTZ with Lasix.  She has no particular concerns or complaints.  She has not had an eye exam.  She also will need to come back in the near future for Pap and pelvic. The following portions of the patient's history were reviewed and updated as appropriate: allergies, current medications, past medical history, past social history and problem list.  ROS as in subjective above.     Objective:    Physical Exam Alert and in no distress otherwise not examined. Hemoglobin A1c is 6.7 Blood pressure (!) 142/90, pulse 62, temperature 97.9 F (36.6 C), temperature source Oral, resp. rate 16, weight 249 lb 12.8 oz (113.3 kg), last menstrual period 08/02/2016, SpO2 96 %.  Lab Review    Latest Ref Rng & Units 06/12/2022    1:36 PM 06/12/2022   12:00 PM 03/13/2022   12:56 PM 12/05/2021   12:00 PM 06/08/2021    3:21 PM  Diabetic Labs  HbA1c 4.0 - 5.6 % 8.3   7.1  8.2  7.2   Microalbumin mg/L    14.3    Micro/Creat Ratio     82.2    Chol 100 - 199 mg/dL  409      HDL >81 mg/dL  45      Calc LDL 0 - 99 mg/dL  86      Triglycerides 0 - 149 mg/dL  191      Creatinine 4.78 - 1.00 mg/dL  2.95          11/01/3084   10:15 AM 09/11/2022    9:59 AM 08/29/2022    9:00 AM 07/30/2022    9:00 AM 06/28/2022    9:00 AM  BP/Weight  Systolic BP 142  578 120 111 132  Diastolic BP 90 90 77 70 79  Wt. (Lbs)  249.8 234 230 235  BMI  45.69 kg/m2 42.8 kg/m2 42.07 kg/m2 42.98 kg/m2      Latest Ref Rng & Units 06/12/2022   11:15 AM 04/19/2021   12:00 AM  Foot/eye exam completion dates  Eye Exam No Retinopathy  No Retinopathy      Foot Form Completion  Done      This result is from an external source.    Alicia Lowery  reports that she has never smoked. She has never used smokeless tobacco. She reports current alcohol use. She reports that she does not use drugs.     Assessment & Plan:     Type 2 diabetes mellitus with complications (HCC) - Plan: POCT glycosylated hemoglobin (Hb A1C), Dulaglutide (TRULICITY) 1.5 MG/0.5ML SOPN, Ambulatory referral to Ophthalmology  Hypertension associated with diabetes (HCC)  Obesity, morbid (HCC)  Hyperlipidemia associated with type 2 diabetes mellitus (HCC)  Screening for colon cancer - Plan: Cologuard  I will increase her Trulicity.  She is to call me in 1 month to let me know how she is doing in terms of GI side effects.  She is to set up an appointment in 4 months for repeat diabetes check as well as Pap and pelvic.  Family did to set her up for 4 months checkup make sure she come back in 4 months okay thanks.

## 2022-09-19 ENCOUNTER — Other Ambulatory Visit (HOSPITAL_COMMUNITY): Payer: Self-pay

## 2022-09-26 ENCOUNTER — Ambulatory Visit (INDEPENDENT_AMBULATORY_CARE_PROVIDER_SITE_OTHER): Payer: 59 | Admitting: Adult Health

## 2022-09-26 ENCOUNTER — Encounter (INDEPENDENT_AMBULATORY_CARE_PROVIDER_SITE_OTHER): Payer: Self-pay | Admitting: Adult Health

## 2022-09-26 VITALS — BP 147/82 | HR 65 | Temp 97.7°F | Ht 62.0 in | Wt 237.0 lb

## 2022-09-26 DIAGNOSIS — Z6841 Body Mass Index (BMI) 40.0 and over, adult: Secondary | ICD-10-CM

## 2022-09-26 DIAGNOSIS — Z7984 Long term (current) use of oral hypoglycemic drugs: Secondary | ICD-10-CM | POA: Diagnosis not present

## 2022-09-26 DIAGNOSIS — Z7985 Long-term (current) use of injectable non-insulin antidiabetic drugs: Secondary | ICD-10-CM

## 2022-09-26 DIAGNOSIS — E119 Type 2 diabetes mellitus without complications: Secondary | ICD-10-CM | POA: Diagnosis not present

## 2022-09-26 DIAGNOSIS — E669 Obesity, unspecified: Secondary | ICD-10-CM

## 2022-09-26 DIAGNOSIS — E559 Vitamin D deficiency, unspecified: Secondary | ICD-10-CM | POA: Diagnosis not present

## 2022-09-26 MED ORDER — VITAMIN D (ERGOCALCIFEROL) 1.25 MG (50000 UNIT) PO CAPS
ORAL_CAPSULE | ORAL | 0 refills | Status: DC
Start: 2022-09-26 — End: 2022-10-24

## 2022-09-26 NOTE — Progress Notes (Signed)
WEIGHT SUMMARY AND BIOMETRICS  Vitals Temp: 97.7 F (36.5 C) BP: (!) 147/82 Pulse Rate: 65 SpO2: 98 %   Anthropometric Measurements Height: 5\' 2"  (1.575 m) Weight: 237 lb (107.5 kg) BMI (Calculated): 43.34 Weight at Last Visit: 234lb Weight Lost Since Last Visit: 0 Weight Gained Since Last Visit: 3lb Starting Weight: 246lb Total Weight Loss (lbs): 9 lb (4.082 kg)   Body Composition  Body Fat %: 51.9 % Fat Mass (lbs): 123.2 lbs Muscle Mass (lbs): 108.4 lbs Visceral Fat Rating : 18   Other Clinical Data Fasting: no Labs: no Today's Visit #: 59 Starting Date: 06/24/19    Chief Complaint:   OBESITY Alicia Lowery is here to discuss her progress with her obesity treatment plan. She is on the the Category 2 Plan and states she is following her eating plan approximately 80 % of the time. She states she is exercising daily walking.   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  Interval health goal over summer- Increase daily activity.  Hunger/appetite-she denies polyphagia. Stress- she is currently at [employer] at [employer]- she interview with [employer] at different site today- she is VERY excited. Sleep- she estimates to sleep Exercise-walking and yard work Hydration-she estimates to drink 30 oz water/day  Subjective:   1. Vitamin D deficiency  Latest Reference Range & Units 03/13/22 12:56 06/12/22 12:00  Vitamin D, 25-Hydroxy 30.0 - 100.0 ng/mL 48.7 41.8  She is in on weekly Ergocalciferol- denies N/V/Muscle Weakness  2. Type 2 diabetes mellitus without complication, unspecified whether long term insulin use (HCC)  Lab Results  Component Value Date   HGBA1C 6.7 (A) 09/11/2022   HGBA1C 8.3 (A) 06/12/2022   HGBA1C 7.1 (H) 03/13/2022   PCP/Dr. Susann Givens re-started her on GLP-1 therapy- Trulicity- currently on 1.5mg  once weekly injection. She is also on Metformin 500mg  BID  with meals Recent fasting CBG 90-110s. She denies sx's hypoglycemia. Denies mass in neck, dysphagia, dyspepsia, persistent hoarseness, abdominal pain, or N/V/C   Assessment/Plan:   1. Vitamin D deficiency Refill - Vitamin D, Ergocalciferol, (DRISDOL) 1.25 MG (50000 UNIT) CAPS capsule; TAKE 1 CAPSULE ONCE EVERY WEEK  Dispense: 4 capsule; Refill: 0  2. Type 2 diabetes mellitus without complication, unspecified whether long term insulin use (HCC) Continue Metformin 500mg  BID and weekly Trulicity 1.5mg   injections Increase daily walking/activity  3. Obesity current BMI 43.34  Alicia Lowery is currently in the action stage of change. As such, her goal is to continue with weight loss efforts. She has agreed to the Category 2 Plan.   Exercise goals: All adults should avoid inactivity. Some physical activity is better than none, and adults who participate in any amount of physical activity gain some health benefits. For additional and more extensive health benefits, adults should increase their aerobic physical activity to 300 minutes (5 hours) a week of moderate-intensity, or 150 minutes a week of vigorous-intensity aerobic physical activity, or an equivalent combination of moderate- and vigorous-intensity activity. Additional health benefits are gained by engaging in physical activity beyond this amount.  Adults should also include muscle-strengthening activities that involve all major muscle groups on 2 or more days a week.  Behavioral modification strategies: increasing lean protein intake, decreasing simple carbohydrates, increasing vegetables, increasing water intake, no skipping meals, meal planning and cooking strategies, keeping healthy foods in the home, ways to avoid boredom eating, and planning for success.  Evienne has agreed to  follow-up with our clinic in 4 weeks. She was informed of the importance of frequent follow-up visits to maximize her success with intensive lifestyle modifications for  her multiple health conditions.   Objective:   Blood pressure (!) 147/82, pulse 65, temperature 97.7 F (36.5 C), height 5\' 2"  (1.575 m), weight 237 lb (107.5 kg), last menstrual period 08/02/2016, SpO2 98 %. Body mass index is 43.35 kg/m.  General: Cooperative, alert, well developed, in no acute distress. HEENT: Conjunctivae and lids unremarkable. Cardiovascular: Regular rhythm.  Lungs: Normal work of breathing. Neurologic: No focal deficits.   Lab Results  Component Value Date   CREATININE 0.89 06/12/2022   BUN 14 06/12/2022   NA 142 06/12/2022   K 4.2 06/12/2022   CL 103 06/12/2022   CO2 24 06/12/2022   Lab Results  Component Value Date   ALT 19 06/12/2022   AST 16 06/12/2022   ALKPHOS 73 06/12/2022   BILITOT 0.7 06/12/2022   Lab Results  Component Value Date   HGBA1C 6.7 (A) 09/11/2022   HGBA1C 8.3 (A) 06/12/2022   HGBA1C 7.1 (H) 03/13/2022   HGBA1C 8.2 (A) 12/05/2021   HGBA1C 7.2 (A) 06/08/2021   Lab Results  Component Value Date   INSULIN 12.5 03/13/2022   INSULIN 13.8 05/02/2021   INSULIN 14.2 03/15/2020   INSULIN 17.6 06/24/2019   Lab Results  Component Value Date   TSH 1.280 06/24/2019   Lab Results  Component Value Date   CHOL 152 06/12/2022   HDL 45 06/12/2022   LDLCALC 86 06/12/2022   TRIG 116 06/12/2022   CHOLHDL 3.4 06/12/2022   Lab Results  Component Value Date   VD25OH 41.8 06/12/2022   VD25OH 48.7 03/13/2022   VD25OH 42.2 05/02/2021   Lab Results  Component Value Date   WBC 3.7 06/12/2022   HGB 13.3 06/12/2022   HCT 40.2 06/12/2022   MCV 92 06/12/2022   PLT 211 06/12/2022   No results found for: "IRON", "TIBC", "FERRITIN"  Attestation Statements:   Reviewed by clinician on day of visit: allergies, medications, problem list, medical history, surgical history, family history, social history, and previous encounter notes.  I have reviewed the above documentation for accuracy and completeness, and I agree with the above. -   Dyneshia Baccam d. Isa Kohlenberg, NP-C

## 2022-10-05 DIAGNOSIS — Z1211 Encounter for screening for malignant neoplasm of colon: Secondary | ICD-10-CM | POA: Diagnosis not present

## 2022-10-12 LAB — COLOGUARD: COLOGUARD: NEGATIVE

## 2022-10-24 ENCOUNTER — Encounter (INDEPENDENT_AMBULATORY_CARE_PROVIDER_SITE_OTHER): Payer: Self-pay | Admitting: Adult Health

## 2022-10-24 ENCOUNTER — Ambulatory Visit (INDEPENDENT_AMBULATORY_CARE_PROVIDER_SITE_OTHER): Payer: 59 | Admitting: Adult Health

## 2022-10-24 VITALS — BP 120/81 | HR 71 | Temp 97.4°F | Ht 62.0 in | Wt 235.0 lb

## 2022-10-24 DIAGNOSIS — E559 Vitamin D deficiency, unspecified: Secondary | ICD-10-CM | POA: Diagnosis not present

## 2022-10-24 DIAGNOSIS — I152 Hypertension secondary to endocrine disorders: Secondary | ICD-10-CM

## 2022-10-24 DIAGNOSIS — E1159 Type 2 diabetes mellitus with other circulatory complications: Secondary | ICD-10-CM | POA: Diagnosis not present

## 2022-10-24 DIAGNOSIS — E669 Obesity, unspecified: Secondary | ICD-10-CM

## 2022-10-24 DIAGNOSIS — Z6841 Body Mass Index (BMI) 40.0 and over, adult: Secondary | ICD-10-CM | POA: Diagnosis not present

## 2022-10-24 MED ORDER — VITAMIN D (ERGOCALCIFEROL) 1.25 MG (50000 UNIT) PO CAPS
ORAL_CAPSULE | ORAL | 0 refills | Status: DC
Start: 2022-10-24 — End: 2023-02-06

## 2022-10-24 NOTE — Progress Notes (Signed)
WEIGHT SUMMARY AND BIOMETRICS  Vitals Temp: (!) 97.4 F (36.3 C) BP: 120/81 Pulse Rate: 71 SpO2: 96 %   Anthropometric Measurements Height: 5\' 2"  (1.575 m) Weight: 235 lb (106.6 kg) BMI (Calculated): 42.97 Weight at Last Visit: 237 lb Weight Lost Since Last Visit: 2 lb Weight Gained Since Last Visit: 0 Starting Weight: 246 lb Total Weight Loss (lbs): 11 lb (4.99 kg)   Body Composition  Body Fat %: 50.8 % Fat Mass (lbs): 119.6 lbs Muscle Mass (lbs): 110 lbs Visceral Fat Rating : 18   Other Clinical Data Fasting: No Labs: No Today's Visit #: 48 Starting Date: 06/24/19    Chief Complaint:   OBESITY Alicia Lowery is here to discuss her progress with her obesity treatment plan. She is on the the Category 2 Plan and states she is following her eating plan approximately 75 % of the time. She states she is exercising Walking 30 minutes 3 times per week.   Interim History: She is off for summer from [employer]. She will focus this summer on her health/wellness.  Stress- SUMMER BREAK!  Sleep- she estimates to sleep 7 hrs/night, reports felling rested I morning.  Exercise-walking 3 x week, at least 30 mins in duration  Hydration-she estimates to drink 40 oz/day  Subjective:   1. Hypertension associated with diabetes (HCC) BP recheck at goal at OV. She denies CP when walking. She denies tobacco/vape use She reports home readings: SBP 120s DBP 70-80s  She take Lisinopril/HZTC 20/12.5mg  QD and Lasix 20mg  every 3-4 days  2. Vitamin D deficiency  Latest Reference Range & Units 05/02/21 14:27 03/13/22 12:56 06/12/22 12:00  Vitamin D, 25-Hydroxy 30.0 - 100.0 ng/mL 42.2 48.7 41.8  She is on weekly Ergocalciferol- denies N/V/Muscle Weakness  Assessment/Plan:   1. Hypertension associated with diabetes (HCC) Continue    lisinopril-hydrochlorothiazide (ZESTORETIC) 20-12.5 MG tablet    Take 1 tablet by mouth daily    furosemide (LASIX) 20 MG tablet    TAKE 1  TABLET BY MOUTH EVERY 3 DAYS    2. Vitamin D deficiency Refil - Vitamin D, Ergocalciferol, (DRISDOL) 1.25 MG (50000 UNIT) CAPS capsule; TAKE 1 CAPSULE ONCE EVERY WEEK  Dispense: 4 capsule; Refill: 0  3. Obesity current BMI 43.34  Alicia Lowery is currently in the action stage of change. As such, her goal is to continue with weight loss efforts. She has agreed to the Category 2 Plan.   Exercise goals: For substantial health benefits, adults should do at least 150 minutes (2 hours and 30 minutes) a week of moderate-intensity, or 75 minutes (1 hour and 15 minutes) a week of vigorous-intensity aerobic physical activity, or an equivalent combination of moderate- and vigorous-intensity aerobic activity. Aerobic activity should be performed in episodes of at least 10 minutes, and preferably, it should be spread throughout the week.  Behavioral modification strategies: increasing lean protein intake, decreasing simple carbohydrates, increasing vegetables, increasing water intake, no skipping meals, meal planning and cooking strategies, better snacking choices, and planning for success.  Alicia Lowery has agreed to follow-up with our clinic in 4 weeks. She was informed of the importance of frequent follow-up visits to maximize her success with intensive lifestyle modifications for her multiple health conditions.   Check Fasting Labs at next OV  Objective:   Blood pressure 120/81, pulse 71, temperature (!) 97.4 F (36.3 C), height 5\' 2"  (1.575 m), weight 235 lb (106.6 kg), last menstrual period 08/02/2016, SpO2 96 %. Body mass index is 42.98 kg/m.  General: Cooperative, alert, well developed, in no acute distress. HEENT: Conjunctivae and lids unremarkable. Cardiovascular: Regular rhythm.  Lungs: Normal work of breathing. Neurologic: No focal deficits.   Lab Results  Component Value Date   CREATININE 0.89 06/12/2022   BUN 14 06/12/2022   NA 142 06/12/2022   K 4.2 06/12/2022   CL 103 06/12/2022   CO2 24  06/12/2022   Lab Results  Component Value Date   ALT 19 06/12/2022   AST 16 06/12/2022   ALKPHOS 73 06/12/2022   BILITOT 0.7 06/12/2022   Lab Results  Component Value Date   HGBA1C 6.7 (A) 09/11/2022   HGBA1C 8.3 (A) 06/12/2022   HGBA1C 7.1 (H) 03/13/2022   HGBA1C 8.2 (A) 12/05/2021   HGBA1C 7.2 (A) 06/08/2021   Lab Results  Component Value Date   INSULIN 12.5 03/13/2022   INSULIN 13.8 05/02/2021   INSULIN 14.2 03/15/2020   INSULIN 17.6 06/24/2019   Lab Results  Component Value Date   TSH 1.280 06/24/2019   Lab Results  Component Value Date   CHOL 152 06/12/2022   HDL 45 06/12/2022   LDLCALC 86 06/12/2022   TRIG 116 06/12/2022   CHOLHDL 3.4 06/12/2022   Lab Results  Component Value Date   VD25OH 41.8 06/12/2022   VD25OH 48.7 03/13/2022   VD25OH 42.2 05/02/2021   Lab Results  Component Value Date   WBC 3.7 06/12/2022   HGB 13.3 06/12/2022   HCT 40.2 06/12/2022   MCV 92 06/12/2022   PLT 211 06/12/2022   No results found for: "IRON", "TIBC", "FERRITIN"  Attestation Statements:   Reviewed by clinician on day of visit: allergies, medications, problem list, medical history, surgical history, family history, social history, and previous encounter notes.  I have reviewed the above documentation for accuracy and completeness, and I agree with the above. -  Merlon Alcorta d. Elanor Cale, NP-C

## 2022-11-08 ENCOUNTER — Other Ambulatory Visit: Payer: Self-pay | Admitting: Family Medicine

## 2022-11-08 ENCOUNTER — Other Ambulatory Visit (INDEPENDENT_AMBULATORY_CARE_PROVIDER_SITE_OTHER): Payer: Self-pay | Admitting: Bariatrics

## 2022-11-08 DIAGNOSIS — R600 Localized edema: Secondary | ICD-10-CM

## 2022-11-08 DIAGNOSIS — E118 Type 2 diabetes mellitus with unspecified complications: Secondary | ICD-10-CM

## 2022-11-29 ENCOUNTER — Ambulatory Visit (INDEPENDENT_AMBULATORY_CARE_PROVIDER_SITE_OTHER): Payer: 59 | Admitting: Adult Health

## 2022-12-11 ENCOUNTER — Encounter (INDEPENDENT_AMBULATORY_CARE_PROVIDER_SITE_OTHER): Payer: Self-pay | Admitting: Adult Health

## 2022-12-11 ENCOUNTER — Ambulatory Visit (INDEPENDENT_AMBULATORY_CARE_PROVIDER_SITE_OTHER): Payer: 59 | Admitting: Adult Health

## 2022-12-11 VITALS — BP 98/64 | HR 71 | Temp 97.7°F | Ht 62.0 in | Wt 237.0 lb

## 2022-12-11 DIAGNOSIS — E119 Type 2 diabetes mellitus without complications: Secondary | ICD-10-CM | POA: Diagnosis not present

## 2022-12-11 DIAGNOSIS — I152 Hypertension secondary to endocrine disorders: Secondary | ICD-10-CM | POA: Diagnosis not present

## 2022-12-11 DIAGNOSIS — Z7985 Long-term (current) use of injectable non-insulin antidiabetic drugs: Secondary | ICD-10-CM

## 2022-12-11 DIAGNOSIS — E1159 Type 2 diabetes mellitus with other circulatory complications: Secondary | ICD-10-CM

## 2022-12-11 DIAGNOSIS — E559 Vitamin D deficiency, unspecified: Secondary | ICD-10-CM

## 2022-12-11 DIAGNOSIS — Z7984 Long term (current) use of oral hypoglycemic drugs: Secondary | ICD-10-CM | POA: Diagnosis not present

## 2022-12-11 DIAGNOSIS — Z6841 Body Mass Index (BMI) 40.0 and over, adult: Secondary | ICD-10-CM

## 2022-12-11 DIAGNOSIS — E669 Obesity, unspecified: Secondary | ICD-10-CM

## 2022-12-11 NOTE — Progress Notes (Signed)
WEIGHT SUMMARY AND BIOMETRICS  Vitals Temp: 97.7 F (36.5 C) BP: 98/64 Pulse Rate: 71 SpO2: 95 %   Anthropometric Measurements Height: 5\' 2"  (1.575 m) Weight: 237 lb (107.5 kg) BMI (Calculated): 43.34 Weight at Last Visit: 235lb Weight Lost Since Last Visit: 0 Weight Gained Since Last Visit: 2lb Starting Weight: 246lb Total Weight Loss (lbs): 9 lb (4.082 kg)   Body Composition  Body Fat %: 49.2 % Fat Mass (lbs): 116.6 lbs Muscle Mass (lbs): 114.4 lbs Total Body Water (lbs): 90.2 lbs Visceral Fat Rating : 17   Other Clinical Data Fasting: yes Labs: yes Today's Visit #: 77 Starting Date: 06/24/19    Chief Complaint:   OBESITY Alicia Lowery is here to discuss her progress with her obesity treatment plan. She is on the the Category 2 Plan and states she is following her eating plan approximately 80 % of the time. She states she is exercising Walking 20-25 minutes 3 times per week.   Interim History:  Alicia Lowery has been enjoying her summer off from [employer]! 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 Hypertension associated with diabetes (HCC) She is on  lisinopril-hydrochlorothiazide (ZESTORETIC) 20-12.5 MG tablet Daily  furosemide (LASIX) 20 MG tablet 1 tab every 3 days   2. Type 2 diabetes mellitus without complication, unspecified whether long term insulin use (HCC) Lab Results  Component Value Date   HGBA1C 6.7 (A) 09/11/2022   HGBA1C 8.3 (A) 06/12/2022   HGBA1C 7.1 (H) 03/13/2022   Home fasting CBG <110s She denies sx's of hypoglycemia PCP manages Metformin 500mg  BID and Trulicity 1.5mg  once weekly injection Denies mass in neck, dysphagia, dyspepsia, persistent hoarseness, abdominal pain, or N/V/C   3. Vitamin D deficiency  Latest Reference Range & Units 05/02/21 14:27 03/13/22 12:56 06/12/22 12:00  Vitamin D, 25-Hydroxy 30.0 - 100.0 ng/mL  42.2 48.7 41.8  She is on weekly Ergocalciferol- denies N/V/Muscle Weaknes  Assessment/Plan:   1. Hypertension associated with diabetes (HCC) Check Labs - Comprehensive metabolic panel  2. Type 2 diabetes mellitus without complication, unspecified whether long term insulin use (HCC) Check Labs - Hemoglobin A1c - Insulin, random - Vitamin B12  3. Vitamin D deficiency Check Labs - VITAMIN D 25 Hydroxy (Vit-D Deficiency, Fractures)  4. Obesity current BMI 43.34  Alicia Lowery is currently in the action stage of change. As such, her goal is to continue with weight loss efforts. She has agreed to the Category 2 Plan.   Exercise goals: All adults should avoid inactivity. Some physical activity is better than none, and adults who participate in any amount of physical activity gain some health benefits. Adults should also include muscle-strengthening activities that involve all major muscle groups on 2 or more days a week.  Behavioral modification strategies: increasing lean protein intake, decreasing simple carbohydrates, increasing vegetables, increasing water intake, no skipping meals, meal planning and cooking strategies, better snacking choices, and planning for success.  Alicia Lowery has agreed to follow-up with our clinic in 4 weeks. She was informed of the importance of frequent follow-up visits to maximize her success with intensive lifestyle modifications for her multiple health conditions.   Alicia Lowery was informed we would discuss her lab results at her next visit unless there is a critical issue that needs to be addressed sooner. Alicia Lowery agreed to keep her next visit at the agreed upon time to discuss these results.  Objective:   Blood pressure 98/64, pulse 71, temperature 97.7 F (36.5 C), height 5\' 2"  (1.575 m), weight 237 lb (107.5 kg), last menstrual period 08/02/2016, SpO2 95%. Body mass index is 43.35 kg/m.  General: Cooperative, alert, well developed, in no acute distress. HEENT:  Conjunctivae and lids unremarkable. Cardiovascular: Regular rhythm.  Lungs: Normal work of breathing. Neurologic: No focal deficits.   Lab Results  Component Value Date   CREATININE 0.89 06/12/2022   BUN 14 06/12/2022   NA 142 06/12/2022   K 4.2 06/12/2022   CL 103 06/12/2022   CO2 24 06/12/2022   Lab Results  Component Value Date   ALT 19 06/12/2022   AST 16 06/12/2022   ALKPHOS 73 06/12/2022   BILITOT 0.7 06/12/2022   Lab Results  Component Value Date   HGBA1C 6.7 (A) 09/11/2022   HGBA1C 8.3 (A) 06/12/2022   HGBA1C 7.1 (H) 03/13/2022   HGBA1C 8.2 (A) 12/05/2021   HGBA1C 7.2 (A) 06/08/2021   Lab Results  Component Value Date   INSULIN 12.5 03/13/2022   INSULIN 13.8 05/02/2021   INSULIN 14.2 03/15/2020   INSULIN 17.6 06/24/2019   Lab Results  Component Value Date   TSH 1.280 06/24/2019   Lab Results  Component Value Date   CHOL 152 06/12/2022   HDL 45 06/12/2022   LDLCALC 86 06/12/2022   TRIG 116 06/12/2022   CHOLHDL 3.4 06/12/2022   Lab Results  Component Value Date   VD25OH 41.8 06/12/2022   VD25OH 48.7 03/13/2022   VD25OH 42.2 05/02/2021   Lab Results  Component Value Date   WBC 3.7 06/12/2022   HGB 13.3 06/12/2022   HCT 40.2 06/12/2022   MCV 92 06/12/2022   PLT 211 06/12/2022   No results found for: "IRON", "TIBC", "FERRITIN"   Attestation Statements:   Reviewed by clinician on day of visit: allergies, medications, problem list, medical history, surgical history, family history, social history, and previous encounter notes.  I have reviewed the above documentation for accuracy and completeness, and I agree with the above. -  Regis Wiland d. Alicia Gilles, NP-C

## 2023-01-01 ENCOUNTER — Other Ambulatory Visit: Payer: Self-pay | Admitting: Family Medicine

## 2023-01-01 DIAGNOSIS — E1169 Type 2 diabetes mellitus with other specified complication: Secondary | ICD-10-CM

## 2023-01-09 ENCOUNTER — Ambulatory Visit (INDEPENDENT_AMBULATORY_CARE_PROVIDER_SITE_OTHER): Payer: 59 | Admitting: Adult Health

## 2023-01-15 ENCOUNTER — Encounter: Payer: Self-pay | Admitting: Family Medicine

## 2023-01-15 ENCOUNTER — Ambulatory Visit: Payer: 59 | Admitting: Family Medicine

## 2023-01-15 VITALS — BP 140/84 | HR 76 | Ht 63.0 in | Wt 238.8 lb

## 2023-01-15 DIAGNOSIS — Z6841 Body Mass Index (BMI) 40.0 and over, adult: Secondary | ICD-10-CM | POA: Diagnosis not present

## 2023-01-15 DIAGNOSIS — E1169 Type 2 diabetes mellitus with other specified complication: Secondary | ICD-10-CM | POA: Diagnosis not present

## 2023-01-15 DIAGNOSIS — E785 Hyperlipidemia, unspecified: Secondary | ICD-10-CM | POA: Diagnosis not present

## 2023-01-15 DIAGNOSIS — I152 Hypertension secondary to endocrine disorders: Secondary | ICD-10-CM

## 2023-01-15 DIAGNOSIS — E1159 Type 2 diabetes mellitus with other circulatory complications: Secondary | ICD-10-CM | POA: Diagnosis not present

## 2023-01-15 DIAGNOSIS — E118 Type 2 diabetes mellitus with unspecified complications: Secondary | ICD-10-CM | POA: Diagnosis not present

## 2023-01-15 DIAGNOSIS — Z23 Encounter for immunization: Secondary | ICD-10-CM

## 2023-01-15 LAB — POCT GLYCOSYLATED HEMOGLOBIN (HGB A1C): Hemoglobin A1C: 6.6 % — AB (ref 4.0–5.6)

## 2023-01-15 NOTE — Progress Notes (Signed)
   Subjective:    Patient ID: MCKAY NOTZ, female    DOB: Oct 12, 1960, 62 y.o.   MRN: [id number]  HPI She is here for a recheck on her diabetes.  She does go to medical weight loss and wellness and has lost roughly 9 pounds since she started on this.  She is trying to pay attention to the foods that she is eating in terms of fat protein and carbohydrates.  She does exercise roughly 30 minutes at a time.  She did have a Cologuard in 2024.  She needs to get into see her eye doctor.   Review of Systems     Objective:    Physical Exam Alert and in no distress otherwise not examined hemoglobin A1c is 6.6       Assessment & Plan:   Problem List Items Addressed This Visit     Hypertension associated with diabetes (HCC) (Chronic)   RESOLVED: Obesity, morbid (HCC) (Chronic)   Class 3 severe obesity with serious comorbidity and body mass index (BMI) of 40.0 to 44.9 in adult [facility])   Hyperlipidemia associated with type 2 diabetes mellitus (HCC)   Type 2 diabetes mellitus with complications (HCC) - Primary   Relevant Orders   HgB A1c (Completed)   Other Visit Diagnoses     Need for influenza vaccination       Relevant Orders   Flu vaccine trivalent PF, 6mos and older(Flulaval,Afluria,Fluarix,Fluzone) (Completed)     Reinforced the need for her to continue with medical weight loss and wellness and continues to make dietary changes to help.  Will continue on her present medication regimen.  Encouraged her to get an ophthalmology evaluation.  Recheck here in 6 months.

## 2023-01-23 ENCOUNTER — Other Ambulatory Visit: Payer: Self-pay | Admitting: Family Medicine

## 2023-01-23 DIAGNOSIS — E1169 Type 2 diabetes mellitus with other specified complication: Secondary | ICD-10-CM

## 2023-01-24 ENCOUNTER — Other Ambulatory Visit (INDEPENDENT_AMBULATORY_CARE_PROVIDER_SITE_OTHER): Payer: 59

## 2023-01-24 DIAGNOSIS — Z23 Encounter for immunization: Secondary | ICD-10-CM

## 2023-02-06 ENCOUNTER — Encounter (INDEPENDENT_AMBULATORY_CARE_PROVIDER_SITE_OTHER): Payer: Self-pay | Admitting: Adult Health

## 2023-02-06 ENCOUNTER — Ambulatory Visit (INDEPENDENT_AMBULATORY_CARE_PROVIDER_SITE_OTHER): Payer: 59 | Admitting: Adult Health

## 2023-02-06 VITALS — BP 128/79 | HR 71 | Temp 97.9°F | Ht 62.0 in | Wt 243.0 lb

## 2023-02-06 DIAGNOSIS — Z6841 Body Mass Index (BMI) 40.0 and over, adult: Secondary | ICD-10-CM

## 2023-02-06 DIAGNOSIS — Z7984 Long term (current) use of oral hypoglycemic drugs: Secondary | ICD-10-CM | POA: Diagnosis not present

## 2023-02-06 DIAGNOSIS — E559 Vitamin D deficiency, unspecified: Secondary | ICD-10-CM

## 2023-02-06 DIAGNOSIS — E669 Obesity, unspecified: Secondary | ICD-10-CM | POA: Diagnosis not present

## 2023-02-06 DIAGNOSIS — E118 Type 2 diabetes mellitus with unspecified complications: Secondary | ICD-10-CM

## 2023-02-06 MED ORDER — VITAMIN D (ERGOCALCIFEROL) 1.25 MG (50000 UNIT) PO CAPS
ORAL_CAPSULE | ORAL | 0 refills | Status: DC
Start: 2023-02-06 — End: 2023-04-04

## 2023-02-06 NOTE — Progress Notes (Signed)
WEIGHT SUMMARY AND BIOMETRICS  Vitals Temp: 97.9 F (36.6 C) BP: 128/79 Pulse Rate: 71 SpO2: 95 %   Anthropometric Measurements Height: 5\' 2"  (1.575 m) Weight: 243 lb (110.2 kg) BMI (Calculated): 44.43 Weight at Last Visit: 237lb Weight Lost Since Last Visit: 0 Weight Gained Since Last Visit: 6lb Starting Weight: 246lb Total Weight Loss (lbs): 3 lb (1.361 kg)   Body Composition  Body Fat %: 52.6 % Fat Mass (lbs): 127.8 lbs Muscle Mass (lbs): 109.4 lbs Visceral Fat Rating : 19   Other Clinical Data Fasting: no Labs: no Today's Visit #: 50 Starting Date: 06/24/19    Chief Complaint:   OBESITY Alicia Lowery is here to discuss her progress with her obesity treatment plan. She is on the the Category 2 Plan and states she is following her eating plan approximately 80 % of the time. She states she is exercising Walking 30 minutes 5 times per week.   Interim History:  She reports activity when she works at [employer]. 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 Vitamin D deficiency  Latest Reference Range & Units 12/11/22 11:07  Vitamin D, 25-Hydroxy 30.0 - 100.0 ng/mL 76.3   She is on weekly Ergocalciferol- denies N/V/Muscle Weakness  2. Type 2 diabetes mellitus with complications (HCC) Lab Results  Component Value Date   HGBA1C 6.6 (A) 01/15/2023   HGBA1C 6.5 (H) 12/11/2022   HGBA1C 6.7 (A) 09/11/2022    She is on daily Metformin 500g BID with meals and weekly Trulicity 1.5mg  Denies mass in neck, dysphagia, dyspepsia, persistent hoarseness, abdominal pain, or N/V/C  She has not been checking home CBG She will periodically experience hypoglycemia sx's, 2-3 times per month. She will quickly consume CHO and sx resolve  Assessment/Plan:   1. Vitamin D deficiency Refill and decrease - Vitamin D,  Ergocalciferol, (DRISDOL) 1.25 MG (50000 UNIT) CAPS capsule; TAKE 1 CAPSULE  EVERY 14 DAYS  Dispense: 4 capsule; Refill: 0  2. Type 2 diabetes mellitus with complications (HCC) Continue antidiabetic medications per PCP  3. Obesity current BMI 44.43  Alicia Lowery is currently in the action stage of change. As such, her goal is to continue with weight loss efforts. She has agreed to the Category 2 Plan.   Exercise goals: All adults should avoid inactivity. Some physical activity is better than none, and adults who participate in any amount of physical activity gain some health benefits. Adults should also include muscle-strengthening activities that involve all major muscle groups on 2 or more days a week.  Behavioral modification strategies: increasing lean protein intake, decreasing simple carbohydrates, increasing vegetables, increasing water intake, no skipping meals, meal planning and cooking strategies, keeping healthy foods in the home, and planning for success.  [name] has agreed to follow-up with our clinic in 4 weeks. She was informed of the importance of frequent follow-up visits to maximize her success with intensive lifestyle modifications for her multiple health conditions.   Objective:   Blood pressure 128/79, pulse 71, temperature 97.9 F (36.6 C), height 5\' 2"  (1.575 m), weight 243 lb (110.2 kg), last menstrual period 08/02/2016, SpO2 95%. Body mass index is 44.45 kg/m.  General: Cooperative, alert, well developed, in no acute distress. HEENT: Conjunctivae and lids unremarkable. Cardiovascular: Regular rhythm.  Lungs: Normal work of breathing. Neurologic: No focal deficits.   Lab Results  Component Value Date   CREATININE 0.88 12/11/2022   BUN 16 12/11/2022   NA 138 12/11/2022   K 4.0 12/11/2022   CL 99 12/11/2022   CO2 22 12/11/2022   Lab Results  Component Value Date   ALT 21 12/11/2022   AST 21 12/11/2022   ALKPHOS 68 12/11/2022   BILITOT 0.7 12/11/2022   Lab  Results  Component Value Date   HGBA1C 6.6 (A) 01/15/2023   HGBA1C 6.5 (H) 12/11/2022   HGBA1C 6.7 (A) 09/11/2022   HGBA1C 8.3 (A) 06/12/2022   HGBA1C 7.1 (H) 03/13/2022   Lab Results  Component Value Date   INSULIN 17.3 12/11/2022   INSULIN 12.5 03/13/2022   INSULIN 13.8 05/02/2021   INSULIN 14.2 03/15/2020   INSULIN 17.6 06/24/2019   Lab Results  Component Value Date   TSH 1.280 06/24/2019   Lab Results  Component Value Date   CHOL 152 06/12/2022   HDL 45 06/12/2022   LDLCALC 86 06/12/2022   TRIG 116 06/12/2022   CHOLHDL 3.4 06/12/2022   Lab Results  Component Value Date   VD25OH 76.3 12/11/2022   VD25OH 41.8 06/12/2022   VD25OH 48.7 03/13/2022   Lab Results  Component Value Date   WBC 3.7 06/12/2022   HGB 13.3 06/12/2022   HCT 40.2 06/12/2022   MCV 92 06/12/2022   PLT 211 06/12/2022   No results found for: "IRON", "TIBC", "FERRITIN"  Attestation Statements:   Reviewed by clinician on day of visit: allergies, medications, problem list, medical history, surgical history, family history, social history, and previous encounter notes.  I have reviewed the above documentation for accuracy and completeness, and I agree with the above. -  Talmage Teaster d. Romuald Mccaslin,NP-C

## 2023-02-11 ENCOUNTER — Other Ambulatory Visit: Payer: Self-pay | Admitting: Family Medicine

## 2023-02-11 DIAGNOSIS — E118 Type 2 diabetes mellitus with unspecified complications: Secondary | ICD-10-CM

## 2023-03-06 ENCOUNTER — Ambulatory Visit (INDEPENDENT_AMBULATORY_CARE_PROVIDER_SITE_OTHER): Payer: 59 | Admitting: Adult Health

## 2023-03-06 ENCOUNTER — Encounter (INDEPENDENT_AMBULATORY_CARE_PROVIDER_SITE_OTHER): Payer: Self-pay | Admitting: Adult Health

## 2023-03-06 DIAGNOSIS — Z7985 Long-term (current) use of injectable non-insulin antidiabetic drugs: Secondary | ICD-10-CM

## 2023-03-06 DIAGNOSIS — E559 Vitamin D deficiency, unspecified: Secondary | ICD-10-CM

## 2023-03-06 DIAGNOSIS — Z6841 Body Mass Index (BMI) 40.0 and over, adult: Secondary | ICD-10-CM | POA: Diagnosis not present

## 2023-03-06 DIAGNOSIS — Z7984 Long term (current) use of oral hypoglycemic drugs: Secondary | ICD-10-CM

## 2023-03-06 DIAGNOSIS — E1159 Type 2 diabetes mellitus with other circulatory complications: Secondary | ICD-10-CM

## 2023-03-06 DIAGNOSIS — I152 Hypertension secondary to endocrine disorders: Secondary | ICD-10-CM

## 2023-03-06 DIAGNOSIS — E669 Obesity, unspecified: Secondary | ICD-10-CM | POA: Diagnosis not present

## 2023-03-06 NOTE — Progress Notes (Addendum)
WEIGHT SUMMARY AND BIOMETRICS  Vitals Temp: 97.7 F (36.5 C) BP: 114/71 Pulse Rate: 81 SpO2: 97 %   Anthropometric Measurements Height: 5\' 2"  (1.575 m) Weight: 239 lb (108.4 kg) BMI (Calculated): 43.7 Weight at Last Visit: 243lb Weight Lost Since Last Visit: 4lb Weight Gained Since Last Visit: 0 Starting Weight: 246lb Total Weight Loss (lbs): 7 lb (3.175 kg)   Body Composition  Body Fat %: 50 % Fat Mass (lbs): 120 lbs Muscle Mass (lbs): 113.8 lbs Total Body Water (lbs): 89.4 lbs Visceral Fat Rating : 18   Other Clinical Data Fasting: no Labs: no Today's Visit #: 51 Starting Date: 06/24/19    Chief Complaint:   OBESITY Alicia Lowery is here to discuss her progress with her obesity treatment plan. She is on the the Category 2 Plan and states she is following her eating plan approximately 70 % of the time. She states she is exercising daily walking.    Interim History:  She attended [facility]- walked A LOT last Friday.  Reviewed Bioim[employer] results with pt: Muscle Mass: +4.4 lbs Adipose Mass: -7.8 lbs  Hydration-she recently purchased a 32 oz water bottle- she strives to drink at least 1-2/day  Of note- PCP/Dr. Susann Givens provides daily Metformin 500mg  BID and weekly Trulicity 1.5mg    Subjective:   1. Vitamin D deficiency  Latest Reference Range & Units 12/11/22 11:07  Vitamin D, 25-Hydroxy 30.0 - 100.0 ng/mL 76.3   02/06/2023- Ergocalciferol reduced from weekly to bi-weekly She denies N/V/Muscle Weakness  2. Hypertension associated with diabetes (HCC) BP at goal at OV She is on   lisinopril-hydrochlorothiazide (ZESTORETIC) 20-12.5 MG tablet Daily  furosemide (LASIX) 20 MG tablet - ordered to take one tab every three days, she has not taken in   Assessment/Plan:   1. Vitamin D deficiency Continue bi-weekly Ergocalciferol Check Level this Fall 2024  2. Hypertension associated with diabetes (HCC) Continue daily Zestoretic 20/12.5mg    3.  Obesity current BMI 43.7  Coral is currently in the action stage of change. As such, her goal is to continue with weight loss efforts. She has agreed to the Category 2 Plan.   Exercise goals: All adults should avoid inactivity. Some physical activity is better than none, and adults who participate in any amount of physical activity gain some health benefits. Adults should also include muscle-strengthening activities that involve all major muscle groups on 2 or more days a week.  Behavioral modification strategies: increasing lean protein intake, decreasing simple carbohydrates, increasing vegetables, increasing water intake, no skipping meals, meal planning and cooking strategies, keeping healthy foods in the home, and planning for success.  Phung has agreed to follow-up with our clinic in 4 weeks. She was informed of the importance of frequent follow-up visits to maximize her success with intensive lifestyle modifications for her multiple health conditions.   Objective:   Blood pressure 114/71, pulse 81, temperature 97.7 F (36.5 C), height 5\' 2"  (1.575 m), weight 239 lb (108.4 kg), last menstrual period 08/02/2016, SpO2 97%. Body mass index is 43.71 kg/m.  General: Cooperative, alert, well developed, in no acute distress. HEENT: Conjunctivae and lids unremarkable. Cardiovascular: Regular rhythm.  Lungs: Normal work of breathing. Neurologic: No focal deficits.   Lab Results  Component Value Date   CREATININE 0.88 12/11/2022   BUN 16 12/11/2022   NA 138 12/11/2022   K 4.0 12/11/2022   CL 99 12/11/2022   CO2 22 12/11/2022   Lab Results  Component Value Date  ALT 21 12/11/2022   AST 21 12/11/2022   ALKPHOS 68 12/11/2022   BILITOT 0.7 12/11/2022   Lab Results  Component Value Date   HGBA1C 6.6 (A) 01/15/2023   HGBA1C 6.5 (H) 12/11/2022   HGBA1C 6.7 (A) 09/11/2022   HGBA1C 8.3 (A) 06/12/2022   HGBA1C 7.1 (H) 03/13/2022   Lab Results  Component Value Date   INSULIN 17.3  12/11/2022   INSULIN 12.5 03/13/2022   INSULIN 13.8 05/02/2021   INSULIN 14.2 03/15/2020   INSULIN 17.6 06/24/2019   Lab Results  Component Value Date   TSH 1.280 06/24/2019   Lab Results  Component Value Date   CHOL 152 06/12/2022   HDL 45 06/12/2022   LDLCALC 86 06/12/2022   TRIG 116 06/12/2022   CHOLHDL 3.4 06/12/2022   Lab Results  Component Value Date   VD25OH 76.3 12/11/2022   VD25OH 41.8 06/12/2022   VD25OH 48.7 03/13/2022   Lab Results  Component Value Date   WBC 3.7 06/12/2022   HGB 13.3 06/12/2022   HCT 40.2 06/12/2022   MCV 92 06/12/2022   PLT 211 06/12/2022   No results found for: "IRON", "TIBC", "FERRITIN"  Attestation Statements:   Reviewed by clinician on day of visit: allergies, medications, problem list, medical history, surgical history, family history, social history, and previous encounter notes.  Time spent on visit including pre-visit chart review and post-visit care and charting was 27 minutes.   I have reviewed the above documentation for accuracy and completeness, and I agree with the above. -  Semir Brill d. Janthony Holleman, NP-C

## 2023-04-04 ENCOUNTER — Ambulatory Visit (INDEPENDENT_AMBULATORY_CARE_PROVIDER_SITE_OTHER): Payer: 59 | Admitting: Adult Health

## 2023-04-04 ENCOUNTER — Encounter (INDEPENDENT_AMBULATORY_CARE_PROVIDER_SITE_OTHER): Payer: Self-pay | Admitting: Adult Health

## 2023-04-04 VITALS — BP 128/82 | HR 67 | Temp 97.7°F | Ht 62.0 in | Wt 242.0 lb

## 2023-04-04 DIAGNOSIS — E559 Vitamin D deficiency, unspecified: Secondary | ICD-10-CM

## 2023-04-04 DIAGNOSIS — Z6841 Body Mass Index (BMI) 40.0 and over, adult: Secondary | ICD-10-CM | POA: Diagnosis not present

## 2023-04-04 DIAGNOSIS — E118 Type 2 diabetes mellitus with unspecified complications: Secondary | ICD-10-CM

## 2023-04-04 DIAGNOSIS — E66813 Obesity, class 3: Secondary | ICD-10-CM

## 2023-04-04 DIAGNOSIS — E669 Obesity, unspecified: Secondary | ICD-10-CM

## 2023-04-04 DIAGNOSIS — E1159 Type 2 diabetes mellitus with other circulatory complications: Secondary | ICD-10-CM

## 2023-04-04 DIAGNOSIS — Z7985 Long-term (current) use of injectable non-insulin antidiabetic drugs: Secondary | ICD-10-CM

## 2023-04-04 DIAGNOSIS — I152 Hypertension secondary to endocrine disorders: Secondary | ICD-10-CM

## 2023-04-04 DIAGNOSIS — Z7984 Long term (current) use of oral hypoglycemic drugs: Secondary | ICD-10-CM

## 2023-04-04 MED ORDER — VITAMIN D (ERGOCALCIFEROL) 1.25 MG (50000 UNIT) PO CAPS
ORAL_CAPSULE | ORAL | 0 refills | Status: DC
Start: 2023-04-04 — End: 2023-05-22

## 2023-04-04 NOTE — Progress Notes (Signed)
WEIGHT SUMMARY AND BIOMETRICS  Vitals Temp: 97.7 F (36.5 C) BP: 128/82 Pulse Rate: 67 SpO2: 97 %   Anthropometric Measurements Height: 5\' 2"  (1.575 m) Weight: 242 lb (109.8 kg) BMI (Calculated): 44.25 Weight at Last Visit: 239lb Weight Lost Since Last Visit: 0 Weight Gained Since Last Visit: 3lb Starting Weight: 246lb Total Weight Loss (lbs): 4 lb (1.814 kg)   Body Composition  Body Fat %: 51.6 % Fat Mass (lbs): 125 lbs Muscle Mass (lbs): 111.2 lbs Total Body Water (lbs): 91.8 lbs Visceral Fat Rating : 18   Other Clinical Data Fasting: no Labs: no Today's Visit #: 29 Starting Date: 06/24/19    Chief Complaint:   OBESITY Alicia Lowery is here to discuss her progress with her obesity treatment plan. She is on the the Category 2 Plan and states she is following her eating plan approximately 90 % of the time.  She states she is exercising Walking 30 minutes 5 times per week.   Interim History:  She has hx of bil plantar fasciitis - she feels that this issue is well controlled She reports worsening L hip pain and bilateral foot pain- 1st metatarsal She had difficulty and remaining asleep last night due to to increase in pain. She did not take any OTC analgesics  She has been successful to consume 1.5 of her 32 oz water bottles  Subjective:   1. 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 Susann Givens provides daily Metformin 500mg  BID and weekly Trulicity 1.5mg     Lab Results  Component Value Date   HGBA1C 6.6 (A) 01/15/2023   HGBA1C 6.5 (H) 12/11/2022   HGBA1C 6.7 (A) 09/11/2022    She reports fasting CBG low 100s She denies sx's of hypoglycemia  3. Hypertension associated with diabetes (HCC) BP at goal at OV She has been limiting  Na+ lisinopril-hydrochlorothiazide (ZESTORETIC) 20-12.5 MG tablet  furosemide (LASIX) 20 MG tablet PRN   She estimates to use PRN Lasix 20 mg 1-2 per month  Assessment/Plan:   1. 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 Hypertension associated with diabetes (HCC) Continue  lisinopril-hydrochlorothiazide (ZESTORETIC) 20-12.5 MG tablet  furosemide (LASIX) 20 MG tablet PRN   4. Obesity current BMI 44.25  Rolayne is currently in the action stage of change. As such, her goal is to continue with weight loss efforts. She has agreed to the Category 2 Plan.   Exercise goals: All adults should avoid inactivity. Some physical activity is better than none, and adults who participate in any amount of physical activity gain some health benefits. Adults should also include muscle-strengthening activities that involve all major muscle groups on 2 or more days a week.  Behavioral modification strategies: increasing lean protein intake, decreasing simple carbohydrates, increasing vegetables, increasing water intake, decreasing eating out, no skipping meals, meal planning and cooking strategies, keeping healthy foods in the home, holiday eating strategies , celebration eating strategies, and planning for success.  Ethelda has agreed to follow-up with our clinic in 4 weeks. She was informed of the importance of frequent follow-up visits  to maximize her success with intensive lifestyle modifications for her multiple health conditions.   Check Fasting Labs at next OV  Objective:   Blood pressure 128/82, pulse 67, temperature 97.7 F (36.5 C), height 5\' 2"  (1.575 m), weight 242 lb (109.8 kg), last menstrual period 08/02/2016, SpO2 97%. Body mass index is 44.26  kg/m.  General: Cooperative, alert, well developed, in no acute distress. HEENT: Conjunctivae and lids unremarkable. Cardiovascular: Regular rhythm.  Lungs: Normal work of breathing. Neurologic: No focal deficits.   Lab Results  Component Value Date   CREATININE 0.88 12/11/2022   BUN 16 12/11/2022   NA 138 12/11/2022   K 4.0 12/11/2022   CL 99 12/11/2022   CO2 22 12/11/2022   Lab Results  Component Value Date   ALT 21 12/11/2022   AST 21 12/11/2022   ALKPHOS 68 12/11/2022   BILITOT 0.7 12/11/2022   Lab Results  Component Value Date   HGBA1C 6.6 (A) 01/15/2023   HGBA1C 6.5 (H) 12/11/2022   HGBA1C 6.7 (A) 09/11/2022   HGBA1C 8.3 (A) 06/12/2022   HGBA1C 7.1 (H) 03/13/2022   Lab Results  Component Value Date   INSULIN 17.3 12/11/2022   INSULIN 12.5 03/13/2022   INSULIN 13.8 05/02/2021   INSULIN 14.2 03/15/2020   INSULIN 17.6 06/24/2019   Lab Results  Component Value Date   TSH 1.280 06/24/2019   Lab Results  Component Value Date   CHOL 152 06/12/2022   HDL 45 06/12/2022   LDLCALC 86 06/12/2022   TRIG 116 06/12/2022   CHOLHDL 3.4 06/12/2022   Lab Results  Component Value Date   VD25OH 76.3 12/11/2022   VD25OH 41.8 06/12/2022   VD25OH 48.7 03/13/2022   Lab Results  Component Value Date   WBC 3.7 06/12/2022   HGB 13.3 06/12/2022   HCT 40.2 06/12/2022   MCV 92 06/12/2022   PLT 211 06/12/2022   No results found for: "IRON", "TIBC", "FERRITIN"  Attestation Statements:   Reviewed by clinician on day of visit: allergies, medications, problem list, medical history, surgical history, family history, social history, and previous encounter notes.  I have reviewed the above documentation for accuracy and completeness, and I agree with the above. -  Diogenes Whirley d. Imara Standiford, NP-C

## 2023-04-23 ENCOUNTER — Other Ambulatory Visit: Payer: Self-pay | Admitting: Family Medicine

## 2023-04-23 DIAGNOSIS — E118 Type 2 diabetes mellitus with unspecified complications: Secondary | ICD-10-CM

## 2023-04-24 DIAGNOSIS — E119 Type 2 diabetes mellitus without complications: Secondary | ICD-10-CM | POA: Diagnosis not present

## 2023-04-24 LAB — HM DIABETES EYE EXAM

## 2023-05-10 ENCOUNTER — Other Ambulatory Visit (INDEPENDENT_AMBULATORY_CARE_PROVIDER_SITE_OTHER): Payer: Self-pay | Admitting: Adult Health

## 2023-05-10 DIAGNOSIS — E559 Vitamin D deficiency, unspecified: Secondary | ICD-10-CM

## 2023-05-22 ENCOUNTER — Ambulatory Visit (INDEPENDENT_AMBULATORY_CARE_PROVIDER_SITE_OTHER): Payer: 59 | Admitting: Adult Health

## 2023-05-22 ENCOUNTER — Encounter (INDEPENDENT_AMBULATORY_CARE_PROVIDER_SITE_OTHER): Payer: Self-pay | Admitting: Adult Health

## 2023-05-22 VITALS — BP 110/68 | HR 71 | Temp 98.0°F | Ht 62.0 in | Wt 238.0 lb

## 2023-05-22 DIAGNOSIS — E1169 Type 2 diabetes mellitus with other specified complication: Secondary | ICD-10-CM | POA: Diagnosis not present

## 2023-05-22 DIAGNOSIS — E669 Obesity, unspecified: Secondary | ICD-10-CM | POA: Diagnosis not present

## 2023-05-22 DIAGNOSIS — Z6841 Body Mass Index (BMI) 40.0 and over, adult: Secondary | ICD-10-CM | POA: Diagnosis not present

## 2023-05-22 DIAGNOSIS — Z7985 Long-term (current) use of injectable non-insulin antidiabetic drugs: Secondary | ICD-10-CM

## 2023-05-22 DIAGNOSIS — E559 Vitamin D deficiency, unspecified: Secondary | ICD-10-CM | POA: Diagnosis not present

## 2023-05-22 DIAGNOSIS — I152 Hypertension secondary to endocrine disorders: Secondary | ICD-10-CM

## 2023-05-22 DIAGNOSIS — E785 Hyperlipidemia, unspecified: Secondary | ICD-10-CM | POA: Diagnosis not present

## 2023-05-22 DIAGNOSIS — Z7984 Long term (current) use of oral hypoglycemic drugs: Secondary | ICD-10-CM | POA: Diagnosis not present

## 2023-05-22 DIAGNOSIS — E1159 Type 2 diabetes mellitus with other circulatory complications: Secondary | ICD-10-CM | POA: Diagnosis not present

## 2023-05-22 DIAGNOSIS — E118 Type 2 diabetes mellitus with unspecified complications: Secondary | ICD-10-CM

## 2023-05-22 MED ORDER — VITAMIN D (ERGOCALCIFEROL) 1.25 MG (50000 UNIT) PO CAPS: ORAL_CAPSULE | ORAL | 0 refills | Status: DC

## 2023-05-22 NOTE — Progress Notes (Addendum)
 WEIGHT SUMMARY AND BIOMETRICS  Vitals Temp: 98 F (36.7 C) BP: 110/68 Pulse Rate: 71 SpO2: 99 %   Anthropometric Measurements Height: 5' 2 (1.575 m) Weight: 238 lb (108 kg) BMI (Calculated): 43.52 Weight at Last Visit: 242lb Weight Lost Since Last Visit: 4lb Weight Gained Since Last Visit: 0 Starting Weight: 246lb Total Weight Loss (lbs): 8 lb (3.629 kg)   Body Composition  Body Fat %: 50.3 % Fat Mass (lbs): 119.8 lbs Muscle Mass (lbs): 112.2 lbs Total Body Water (lbs): 93 lbs Visceral Fat Rating : 18   Other Clinical Data Fasting: yes Labs: yes Today's Visit #: 23 Starting Date: 06/24/19    Chief Complaint:   OBESITY Alicia Lowery is here to discuss her progress with her obesity treatment plan.  She is on the the Category 2 Plan and states she is following her eating plan approximately 70 % of the time.  She states she is exercising Walking 25 minutes 3 times per week.   Interim History:  2025 Health Goals 1) Remain as active as possible 2) Remain as healthy as possible 3) Start vegetable garden 4) Retire June 2025  We discussed the risk/benefits of OTC Ber[place]ine  Subjective:   1. Vitamin D  deficiency She is on bi-weekly Ergocalciferol - denies N/V/Muscle Weakness  2. Hyperlipidemia associated with type 2 diabetes mellitus (HCC) PCP manges daily Crestor  20mg  She reports 100% compliance  She denies tobacco/vape use  3. Type 2 diabetes mellitus with complications (HCC) PCP manges daily Metformin  500mg  BID with meals and weekly Trulicity  1.5mg  Denies mass in neck, dysphagia, dyspepsia, persistent hoarseness, abdominal pain, or N/V/C  She reports fasting CBG will typically range from 100s- 120s She will experience elevated readings in 140s if she conusmes  4. Hypertension associated with diabetes (HCC) BP at goal at OV She reports home readings: SBP: 120s DBP: 60-70s  Assessment/Plan:   1. Vitamin D  deficiency Check Labs and Refill -  VITAMIN D  25 Hydroxy (Vit-D Deficiency, Fractures) - Vitamin D , Ergocalciferol , (DRISDOL ) 1.25 MG (50000 UNIT) CAPS capsule; TAKE 1 CAPSULE  EVERY 14 DAYS  Dispense: 4 capsule; Refill: 0  2. Hyperlipidemia associated with type 2 diabetes mellitus (HCC) Continue daily Crestor  20mg   3. Type 2 diabetes mellitus with complications (HCC) Check Labs - Hemoglobin A1c - Insulin , random - Vitamin B12 - Magnesium  4. Hypertension associated with diabetes (HCC) Check Labs - Comprehensive metabolic panel  5. Obesity current BMI 43.52 (Primary)  Alicia Lowery is currently in the action stage of change. As such, her goal is to continue with weight loss efforts. She has agreed to the Category 2 Plan.   Exercise goals: All adults should avoid inactivity. Some physical activity is better than none, and adults who participate in any amount of physical activity gain some health benefits. Adults should also include muscle-strengthening activities that involve all major muscle groups on 2 or more days a week.  Behavioral modification strategies: increasing lean protein intake, decreasing simple carbohydrates, increasing vegetables, increasing water intake, no skipping meals, meal planning and cooking strategies, keeping healthy foods in the home, ways to avoid boredom eating, better snacking choices, and planning for success.  Alicia Lowery has agreed to follow-up with our clinic in 4 weeks. She was informed of the importance of frequent follow-up visits to maximize her success with intensive lifestyle modifications for her multiple health conditions.   Alicia Lowery was informed we would discuss her lab results at her next visit unless there is a critical issue that needs to  be addressed sooner. Alicia Lowery agreed to keep her next visit at the agreed upon time to discuss these results.  Objective:   Blood pressure 110/68, pulse 71, temperature 98 F (36.7 C), height 5' 2 (1.575 m), weight 238 lb (108 kg), last menstrual period  08/02/2016, SpO2 99%. Body mass index is 43.53 kg/m.  General: Cooperative, alert, well developed, in no acute distress. HEENT: Conjunctivae and lids unremarkable. Cardiovascular: Regular rhythm.  Lungs: Normal work of breathing. Neurologic: No focal deficits.   Lab Results  Component Value Date   CREATININE 0.88 12/11/2022   BUN 16 12/11/2022   NA 138 12/11/2022   K 4.0 12/11/2022   CL 99 12/11/2022   CO2 22 12/11/2022   Lab Results  Component Value Date   ALT 21 12/11/2022   AST 21 12/11/2022   ALKPHOS 68 12/11/2022   BILITOT 0.7 12/11/2022   Lab Results  Component Value Date   HGBA1C 6.6 (A) 01/15/2023   HGBA1C 6.5 (H) 12/11/2022   HGBA1C 6.7 (A) 09/11/2022   HGBA1C 8.3 (A) 06/12/2022   HGBA1C 7.1 (H) 03/13/2022   Lab Results  Component Value Date   INSULIN  17.3 12/11/2022   INSULIN  12.5 03/13/2022   INSULIN  13.8 05/02/2021   INSULIN  14.2 03/15/2020   INSULIN  17.6 06/24/2019   Lab Results  Component Value Date   TSH 1.280 06/24/2019   Lab Results  Component Value Date   CHOL 152 06/12/2022   HDL 45 06/12/2022   LDLCALC 86 06/12/2022   TRIG 116 06/12/2022   CHOLHDL 3.4 06/12/2022   Lab Results  Component Value Date   VD25OH 76.3 12/11/2022   VD25OH 41.8 06/12/2022   VD25OH 48.7 03/13/2022   Lab Results  Component Value Date   WBC 3.7 06/12/2022   HGB 13.3 06/12/2022   HCT 40.2 06/12/2022   MCV 92 06/12/2022   PLT 211 06/12/2022   No results found for: IRON, TIBC, FERRITIN  Attestation Statements:   Reviewed by clinician on day of visit: allergies, medications, problem list, medical history, surgical history, family history, social history, and previous encounter notes.  I have reviewed the above documentation for accuracy and completeness, and I agree with the above. -  Terilyn Sano d. Kaliegh Willadsen, NP-C

## 2023-05-23 LAB — HEMOGLOBIN A1C
Est. average glucose Bld gHb Est-mCnc: 151 mg/dL
Hgb A1c MFr Bld: 6.9 % — ABNORMAL HIGH (ref 4.8–5.6)

## 2023-05-23 LAB — COMPREHENSIVE METABOLIC PANEL
ALT: 17 [IU]/L (ref 0–32)
AST: 14 [IU]/L (ref 0–40)
Albumin: 4.3 g/dL (ref 3.9–4.9)
Alkaline Phosphatase: 71 [IU]/L (ref 44–121)
BUN/Creatinine Ratio: 19 (ref 12–28)
BUN: 16 mg/dL (ref 8–27)
Bilirubin Total: 0.7 mg/dL (ref 0.0–1.2)
CO2: 24 mmol/L (ref 20–29)
Calcium: 9.6 mg/dL (ref 8.7–10.3)
Chloride: 104 mmol/L (ref 96–106)
Creatinine, Ser: 0.86 mg/dL (ref 0.57–1.00)
Globulin, Total: 2.6 g/dL (ref 1.5–4.5)
Glucose: 104 mg/dL — ABNORMAL HIGH (ref 70–99)
Potassium: 4.4 mmol/L (ref 3.5–5.2)
Sodium: 144 mmol/L (ref 134–144)
Total Protein: 6.9 g/dL (ref 6.0–8.5)
eGFR: 76 mL/min/{1.73_m2} (ref >59)

## 2023-05-23 LAB — VITAMIN D 25 HYDROXY (VIT D DEFICIENCY, FRACTURES): Vit D, 25-Hydroxy: 55.1 ng/mL (ref 30.0–100.0)

## 2023-05-23 LAB — INSULIN, RANDOM: INSULIN: 16 u[IU]/mL (ref 2.6–24.9)

## 2023-05-23 LAB — VITAMIN B12: Vitamin B-12: 1045 pg/mL (ref 232–1245)

## 2023-05-23 LAB — MAGNESIUM: Magnesium: 2.3 mg/dL (ref 1.6–2.3)

## 2023-06-17 ENCOUNTER — Other Ambulatory Visit: Payer: Self-pay | Admitting: Family Medicine

## 2023-06-17 DIAGNOSIS — E1159 Type 2 diabetes mellitus with other circulatory complications: Secondary | ICD-10-CM

## 2023-06-19 ENCOUNTER — Encounter (INDEPENDENT_AMBULATORY_CARE_PROVIDER_SITE_OTHER): Payer: Self-pay | Admitting: Adult Health

## 2023-06-19 ENCOUNTER — Ambulatory Visit (INDEPENDENT_AMBULATORY_CARE_PROVIDER_SITE_OTHER): Payer: 59 | Admitting: Adult Health

## 2023-06-19 VITALS — BP 152/76 | HR 76 | Temp 98.1°F | Ht 62.0 in | Wt 238.0 lb

## 2023-06-19 DIAGNOSIS — I152 Hypertension secondary to endocrine disorders: Secondary | ICD-10-CM

## 2023-06-19 DIAGNOSIS — E118 Type 2 diabetes mellitus with unspecified complications: Secondary | ICD-10-CM | POA: Diagnosis not present

## 2023-06-19 DIAGNOSIS — E1159 Type 2 diabetes mellitus with other circulatory complications: Secondary | ICD-10-CM

## 2023-06-19 DIAGNOSIS — E669 Obesity, unspecified: Secondary | ICD-10-CM | POA: Diagnosis not present

## 2023-06-19 DIAGNOSIS — Z7984 Long term (current) use of oral hypoglycemic drugs: Secondary | ICD-10-CM | POA: Diagnosis not present

## 2023-06-19 DIAGNOSIS — M109 Gout, unspecified: Secondary | ICD-10-CM | POA: Diagnosis not present

## 2023-06-19 DIAGNOSIS — E559 Vitamin D deficiency, unspecified: Secondary | ICD-10-CM

## 2023-06-19 DIAGNOSIS — Z7985 Long-term (current) use of injectable non-insulin antidiabetic drugs: Secondary | ICD-10-CM

## 2023-06-19 DIAGNOSIS — Z6841 Body Mass Index (BMI) 40.0 and over, adult: Secondary | ICD-10-CM | POA: Diagnosis not present

## 2023-06-19 NOTE — Progress Notes (Signed)
 WEIGHT SUMMARY AND BIOMETRICS  Vitals Temp: 98.1 F (36.7 C) BP: (!) 152/76 Pulse Rate: 76 SpO2: 98 %   Anthropometric Measurements Height: 5' 2 (1.575 m) Weight: 238 lb (108 kg) BMI (Calculated): 43.52 Weight at Last Visit: 238lb Weight Lost Since Last Visit: 0 Weight Gained Since Last Visit: 0 Starting Weight: 246lb Total Weight Loss (lbs): 8 lb (3.629 kg)   Body Composition  Body Fat %: 51.2 % Fat Mass (lbs): 122 lbs Muscle Mass (lbs): 110.4 lbs Total Body Water (lbs): 90 lbs Visceral Fat Rating : 18   Other Clinical Data Fasting: yes Labs: no Today's Visit #: 74 Starting Date: 06/24/19    Chief Complaint:   OBESITY Alicia Lowery is here to discuss her progress with her obesity treatment plan. She is on the the Category 2 Plan and states she is following her eating plan approximately 85 % of the time.  She states she is exercising walking with children while working at [employer] 30 minutes 5 times per week.   Interim History:  Alicia Lowery provided the following food recall typical of a day: Breakfast: several eggs or a vegetable salad with protein (either shrimp or turkey) Serving of fruit Lunch: Tacos or turkey sandwich or [employer] or [employer] for turkey special Goes to work at [employer] Work until [employer]: [employer] or meat protein with vegetable  Hydration-she estimates to drink 40 oz water/day  Subjective:   1. Vitamin D  deficiency Discussed Labs  Latest Reference Range & Units 05/22/23 08:39  Vitamin D , 25-Hydroxy 30.0 - 100.0 ng/mL 55.1       Vit D level at both at goal She is on bi-weekly Ergocalciferol   2. Hypertension associated with diabetes (HCC) Discussed Labs 05/22/2023 CMP: Electrolytes and liver/kidney enzymes - all stable BP above goal at OV She denies CP with exertion She is on daily Lisinopril /hydrochlorothiazide  20/12.5mg   Lasix  20mg  ordered every 3 days, she estimates to take once every 5-7 days She denies lower  extremity edema or dyspnea  3. Type 2 diabetes mellitus with complications [facility]) Discussed Labs Lab Results  Component Value Date   HGBA1C 6.9 (H) 05/22/2023   HGBA1C 6.6 (A) 01/15/2023   HGBA1C 6.5 (H) 12/11/2022     Latest Reference Range & Units 03/13/22 12:56 12/11/22 11:07 05/22/23 08:39  INSULIN  2.6 - 24.9 uIU/mL 12.5 17.3 16.0   Vitamin B12 232 - 1,245 pg/mL 1,045   Insulin  level stable, yet above goal of 5-10 A1c just under goal of 7 PCP manages daily Metformin  500mg  BID and weekly Trulicity  1.5mg  Denies mass in neck, dysphagia, dyspepsia, persistent hoarseness, abdominal pain, or N/V/C  Home fasting CBG 94-117 She denies sx's of hypoglycemia  4. Gout, unspecified cause, unspecified chronicity, unspecified site She is not on daily prophylactic treatment Last flare up Jan 2021- L [place]  PCP treated with Colchicine  and NSAIDs  Assessment/Plan:   1. Vitamin D  deficiency (Primary) Continue bi-weekly Ergocalciferol - does not require refill today  2. Hypertension associated with diabetes (HCC) Limit Na+ Take all antihypertensive therapy as directed. Increase water intake, to at least 80-100 oz water/day.  3. Type 2 diabetes mellitus with complications (HCC) Decrease simple CHO/sugar Increase lean protein intake Reduce eating out  4. Gout, unspecified cause, unspecified chronicity, unspecified site Increase water intake, to at least 80-100 oz water/day. Avoid known food triggers.  5. Obesity current BMI 43.52  Alicia Lowery is currently in the action stage of change. As such, her goal is to continue with  weight loss efforts. She has agreed to the Category 2 Plan.   Exercise goals: For substantial health benefits, adults should do at least 150 minutes (2 hours and 30 minutes) a week of moderate-intensity, or 75 minutes (1 hour and 15 minutes) a week of vigorous-intensity aerobic physical activity, or an equivalent combination of moderate- and vigorous-intensity aerobic  activity. Aerobic activity should be performed in episodes of at least 10 minutes, and preferably, it should be spread throughout the week.  Behavioral modification strategies: increasing lean protein intake, decreasing simple carbohydrates, increasing vegetables, increasing water intake, no skipping meals, meal planning and cooking strategies, keeping healthy foods in the home, ways to avoid boredom eating, and planning for success.  Alicia Lowery has agreed to follow-up with our clinic in 4 weeks. She was informed of the importance of frequent follow-up visits to maximize her success with intensive lifestyle modifications for her multiple health conditions.   Objective:   Blood pressure (!) 152/76, pulse 76, temperature 98.1 F (36.7 C), height 5' 2 (1.575 m), weight 238 lb (108 kg), last menstrual period 08/02/2016, SpO2 98%. Body mass index is 43.53 kg/m.  General: Cooperative, alert, well developed, in no acute distress. HEENT: Conjunctivae and lids unremarkable. Cardiovascular: Regular rhythm.  Lungs: Normal work of breathing. Neurologic: No focal deficits.   Lab Results  Component Value Date   CREATININE 0.86 05/22/2023   BUN 16 05/22/2023   NA 144 05/22/2023   K 4.4 05/22/2023   CL 104 05/22/2023   CO2 24 05/22/2023   Lab Results  Component Value Date   ALT 17 05/22/2023   AST 14 05/22/2023   ALKPHOS 71 05/22/2023   BILITOT 0.7 05/22/2023   Lab Results  Component Value Date   HGBA1C 6.9 (H) 05/22/2023   HGBA1C 6.6 (A) 01/15/2023   HGBA1C 6.5 (H) 12/11/2022   HGBA1C 6.7 (A) 09/11/2022   HGBA1C 8.3 (A) 06/12/2022   Lab Results  Component Value Date   INSULIN  16.0 05/22/2023   INSULIN  17.3 12/11/2022   INSULIN  12.5 03/13/2022   INSULIN  13.8 05/02/2021   INSULIN  14.2 03/15/2020   Lab Results  Component Value Date   TSH 1.280 06/24/2019   Lab Results  Component Value Date   CHOL 152 06/12/2022   HDL 45 06/12/2022   LDLCALC 86 06/12/2022   TRIG 116 06/12/2022    CHOLHDL 3.4 06/12/2022   Lab Results  Component Value Date   VD25OH 55.1 05/22/2023   VD25OH 76.3 12/11/2022   VD25OH 41.8 06/12/2022   Lab Results  Component Value Date   WBC 3.7 06/12/2022   HGB 13.3 06/12/2022   HCT 40.2 06/12/2022   MCV 92 06/12/2022   PLT 211 06/12/2022   No results found for: IRON, TIBC, FERRITIN  Alicia Lowery was educated on the importance of frequent visits to treat obesity as outlined per CMS and USPSTF guidelines and agreed to schedule her next follow up appointment today.  Attestation Statements:   Reviewed by clinician on day of visit: allergies, medications, problem list, medical history, surgical history, family history, social history, and previous encounter notes.  I have reviewed the above documentation for accuracy and completeness, and I agree with the above. -  Jontay Maston d. Ranard Harte, NP-C

## 2023-06-27 ENCOUNTER — Other Ambulatory Visit: Payer: Self-pay | Admitting: Family Medicine

## 2023-06-27 DIAGNOSIS — E118 Type 2 diabetes mellitus with unspecified complications: Secondary | ICD-10-CM

## 2023-06-27 NOTE — Telephone Encounter (Signed)
Patient has an appt in March

## 2023-07-09 ENCOUNTER — Encounter: Payer: Self-pay | Admitting: Internal Medicine

## 2023-07-10 ENCOUNTER — Other Ambulatory Visit (INDEPENDENT_AMBULATORY_CARE_PROVIDER_SITE_OTHER): Payer: Self-pay | Admitting: Adult Health

## 2023-07-10 DIAGNOSIS — E559 Vitamin D deficiency, unspecified: Secondary | ICD-10-CM

## 2023-07-17 ENCOUNTER — Encounter: Payer: Self-pay | Admitting: Family Medicine

## 2023-07-17 ENCOUNTER — Ambulatory Visit (INDEPENDENT_AMBULATORY_CARE_PROVIDER_SITE_OTHER): Payer: 59 | Admitting: Family Medicine

## 2023-07-17 VITALS — BP 116/74 | HR 72 | Ht 62.0 in | Wt 239.0 lb

## 2023-07-17 DIAGNOSIS — Z6841 Body Mass Index (BMI) 40.0 and over, adult: Secondary | ICD-10-CM

## 2023-07-17 DIAGNOSIS — I152 Hypertension secondary to endocrine disorders: Secondary | ICD-10-CM

## 2023-07-17 DIAGNOSIS — E1159 Type 2 diabetes mellitus with other circulatory complications: Secondary | ICD-10-CM | POA: Diagnosis not present

## 2023-07-17 DIAGNOSIS — E1169 Type 2 diabetes mellitus with other specified complication: Secondary | ICD-10-CM | POA: Diagnosis not present

## 2023-07-17 DIAGNOSIS — E66813 Obesity, class 3: Secondary | ICD-10-CM | POA: Diagnosis not present

## 2023-07-17 DIAGNOSIS — E118 Type 2 diabetes mellitus with unspecified complications: Secondary | ICD-10-CM

## 2023-07-17 DIAGNOSIS — E785 Hyperlipidemia, unspecified: Secondary | ICD-10-CM | POA: Diagnosis not present

## 2023-07-17 LAB — POCT UA - MICROALBUMIN
Albumin/Creatinine Ratio, Urine, POC: 7.9
Creatinine, POC: 71.3 mg/dL
Microalbumin Ur, POC: 5.6 mg/L

## 2023-07-17 MED ORDER — ROSUVASTATIN CALCIUM 20 MG PO TABS: 20.0000 mg | ORAL_TABLET | Freq: Every day | ORAL | 1 refills | Status: DC

## 2023-07-17 MED ORDER — TRULICITY 3 MG/0.5ML ~~LOC~~ SOAJ: 3.0000 mg | SUBCUTANEOUS | 1 refills | Status: DC

## 2023-07-17 MED ORDER — ACCU-CHEK GUIDE TEST VI STRP: ORAL_STRIP | 12 refills | Status: DC

## 2023-07-17 NOTE — Progress Notes (Signed)
  Subjective:    Patient ID: Alicia Lowery, female    DOB: 04-15-1961, 63 y.o.   MRN: [id number]  Alicia Lowery is a 63 y.o. female who presents for follow-up of Type 2 diabetes mellitus.  Patient is checking home blood sugars.   Home blood sugar records: BGs range between 84 and 125- 130 How often is blood sugars being checked: at least 3-4 times a week Current symptoms/problems include none and have been stable. Daily foot checks: yes   Any foot concerns: Top of feet hurt before bed, left more than right. 2/10.  Last eye exam: December 2024 Exercise: The patient has a physically strenuous job, but has no regular exercise apart from work.  She continues to be followed at healthy weight loss and wellness.  That note was reviewed with her.  Review of the record indicates she really has not had much of a weight loss in quite some time.  She continues on Trulicity 1.5 as well as metformin.  She is also taking Crestor, Lasix, lisinopril/HCTZ. The following portions of the patient's history were reviewed and updated as appropriate: allergies, current medications, past medical history, past social history and problem list.  ROS as in subjective above.     Objective:    Physical Exam Alert and in no distress foot exam done however pulses difficult to appreciate due to adiposity. Blood pressure 116/74, pulse 72, height 5\' 2"  (1.575 m), weight 239 lb (108.4 kg), last menstrual period 08/02/2016, SpO2 95%.  Lab Review    Latest Ref Rng & Units 05/22/2023    8:39 AM 01/15/2023   10:21 AM 12/11/2022   11:07 AM 09/11/2022   10:27 AM 06/12/2022    1:36 PM  Diabetic Labs  HbA1c 4.8 - 5.6 % 6.9  6.6  6.5  6.7  8.3   Creatinine 0.57 - 1.00 mg/dL 4.09   8.11         01/12/4781   10:25 AM 06/19/2023   10:04 AM 06/19/2023    9:00 AM 05/22/2023    7:00 AM 04/04/2023   10:00 AM  BP/Weight  Systolic BP 116 152 142 110 128  Diastolic BP 74 76 74 68 82  Wt. (Lbs) 239  238 238 242  BMI 43.71 kg/m2  43.53 kg/m2  43.53 kg/m2 44.26 kg/m2      07/17/2023   10:30 AM 06/12/2022   11:15 AM  Foot/eye exam completion dates  Foot Form Completion Done Done    Corri  reports that she has never smoked. She has never used smokeless tobacco. She reports current alcohol use. She reports that she does not use drugs.     Assessment & Plan:    Class 3 severe obesity due to excess calories with serious comorbidity and body mass index (BMI) of 40.0 to 44.9 in adult [facility])  Hyperlipidemia associated with type 2 diabetes mellitus (HCC)  Hypertension associated with diabetes (HCC)  Type 2 diabetes mellitus with complications (HCC) She will continue to be followed at weight loss and wellness.  I will also increase her Trulicity to help with the A1c and also possibly help with weight reduction.  She was comfortable with that.  Plan to recheck her in about 6 months.

## 2023-07-18 ENCOUNTER — Encounter (INDEPENDENT_AMBULATORY_CARE_PROVIDER_SITE_OTHER): Payer: Self-pay | Admitting: Adult Health

## 2023-07-18 ENCOUNTER — Ambulatory Visit (INDEPENDENT_AMBULATORY_CARE_PROVIDER_SITE_OTHER): Payer: 59 | Admitting: Adult Health

## 2023-07-18 VITALS — BP 129/71 | HR 76 | Temp 97.8°F | Ht 62.0 in | Wt 234.0 lb

## 2023-07-18 DIAGNOSIS — E1159 Type 2 diabetes mellitus with other circulatory complications: Secondary | ICD-10-CM

## 2023-07-18 DIAGNOSIS — Z7984 Long term (current) use of oral hypoglycemic drugs: Secondary | ICD-10-CM

## 2023-07-18 DIAGNOSIS — E559 Vitamin D deficiency, unspecified: Secondary | ICD-10-CM

## 2023-07-18 DIAGNOSIS — Z7985 Long-term (current) use of injectable non-insulin antidiabetic drugs: Secondary | ICD-10-CM

## 2023-07-18 DIAGNOSIS — E118 Type 2 diabetes mellitus with unspecified complications: Secondary | ICD-10-CM

## 2023-07-18 DIAGNOSIS — Z6841 Body Mass Index (BMI) 40.0 and over, adult: Secondary | ICD-10-CM | POA: Diagnosis not present

## 2023-07-18 DIAGNOSIS — I152 Hypertension secondary to endocrine disorders: Secondary | ICD-10-CM | POA: Diagnosis not present

## 2023-07-18 DIAGNOSIS — E66813 Obesity, class 3: Secondary | ICD-10-CM | POA: Diagnosis not present

## 2023-07-18 MED ORDER — VITAMIN D (ERGOCALCIFEROL) 1.25 MG (50000 UNIT) PO CAPS: ORAL_CAPSULE | ORAL | 0 refills | Status: DC

## 2023-07-18 NOTE — Progress Notes (Signed)
 WEIGHT SUMMARY AND BIOMETRICS  Vitals Temp: 97.8 F (36.6 C) BP: 129/71 Pulse Rate: 76 SpO2: 97 %   Anthropometric Measurements Height: 5\' 2"  (1.575 m) Weight: 234 lb (106.1 kg) BMI (Calculated): 42.79 Weight at Last Visit: 238 lb Weight Lost Since Last Visit: 4 lb Weight Gained Since Last Visit: 0 Starting Weight: 246 lb Total Weight Loss (lbs): 12 lb (5.443 kg)   Body Composition  Body Fat %: 46 % Fat Mass (lbs): 107.8 lbs Muscle Mass (lbs): 120 lbs Total Body Water (lbs): 92 lbs Visceral Fat Rating : 15   Other Clinical Data Fasting: yes Labs: no Today's Visit #: 55 Starting Date: 06/24/19    Chief Complaint:   OBESITY Alicia Lowery is here to discuss her progress with her obesity treatment plan.  She is on the the Category 2 Plan and states she is following her eating plan approximately 80 % of the time.  She states she is exercising Walking 30 minutes 5 times per week.   Interim History:  She had chronic follow-up with PCP/Dr. Susann Lowery yesterday. He increased weekly Trulicity from 1.5mg  to 3mg  Continued on Metformin 500mg  BID She is waiting new refill of increased GLP-1 therapy She typically injects GLP-1 on either Thursday or Friday  Subjective:   1. Hypertension associated with diabetes (HCC) BP at goal at OV She denies CP with exertion She denies tobacco/vape use She is on  metFORMIN (GLUCOPHAGE) 500 MG tablet  furosemide (LASIX) 20 MG tablet  lisinopril-hydrochlorothiazide (ZESTORETIC) 20-12.5 MG tablet  rosuvastatin (CRESTOR) 20 MG tablet  Dulaglutide (TRULICITY) 3 MG/0.5ML SOAJ   2. Type 2 diabetes mellitus with complications (HCC) PCP increased Trulicity from 1.5mg  to 3mg  - she has yet to increase- pharmacy has not filled increased GLP-1 She believes that she has one or two 1.5mg  injections at home She typically takes Trulicity on either Thursday or Friday. Home fasting CBG mid 80s to 130s She denies sx's of hypogylcemia  3.  Vitamin  D deficiency  Latest Reference Range & Units 05/22/23 08:39  Vitamin D, 25-Hydroxy 30.0 - 100.0 ng/mL 55.1  Vitamin B12 232 - 1,245 pg/mL 1,045   She endorses stable energy level She is on bi-weekly Ergocalciferol- denies N/V/Muscle Weakness  Assessment/Plan:   1. Hypertension associated with diabetes (HCC) (Primary) Continue metFORMIN (GLUCOPHAGE) 500 MG tablet  furosemide (LASIX) 20 MG tablet  lisinopril-hydrochlorothiazide (ZESTORETIC) 20-12.5 MG tablet  rosuvastatin (CRESTOR) 20 MG tablet  Dulaglutide (TRULICITY) 3 MG/0.5ML SOAJ   2. Type 2 diabetes mellitus with complications (HCC) Continue Metformin and Trulicity per PCP  3. Vitamin D deficiency Refill - Vitamin D, Ergocalciferol, (DRISDOL) 1.25 MG (50000 UNIT) CAPS capsule; TAKE 1 CAPSULE  EVERY 14 DAYS  Dispense: 4 capsule; Refill: 0  4. Class 3 severe obesity due to excess calories with serious comorbidity and body mass index (BMI) of 40.0 to 44.9 in adult [facility]), CURRENT BMI 39  Alicia Lowery is currently in the action stage of change. As such, her goal is to continue with weight loss efforts. She has agreed to the Category 2 Plan.   Exercise goals: For substantial health benefits, adults should do at least 150 minutes (2 hours and 30 minutes) a week of moderate-intensity, or 75 minutes (1 hour and 15 minutes) a week of vigorous-intensity aerobic physical activity, or an equivalent combination of moderate- and vigorous-intensity aerobic activity. Aerobic activity should be performed in episodes of at least 10 minutes, and preferably, it should be spread throughout the week.  Behavioral modification  strategies: increasing lean protein intake, decreasing simple carbohydrates, increasing vegetables, increasing water intake, no skipping meals, meal planning and cooking strategies, keeping healthy foods in the home, ways to avoid boredom eating, and planning for success.  Alicia Lowery has agreed to follow-up with our clinic in 4 weeks. She  was informed of the importance of frequent follow-up visits to maximize her success with intensive lifestyle modifications for her multiple health conditions.   Objective:   Blood pressure 129/71, pulse 76, temperature 97.8 F (36.6 C), height 5\' 2"  (1.575 m), weight 234 lb (106.1 kg), last menstrual period 08/02/2016, SpO2 97%. Body mass index is 42.8 kg/m.  General: Cooperative, alert, well developed, in no acute distress. HEENT: Conjunctivae and lids unremarkable. Cardiovascular: Regular rhythm.  Lungs: Normal work of breathing. Neurologic: No focal deficits.   Lab Results  Component Value Date   CREATININE 0.86 05/22/2023   BUN 16 05/22/2023   NA 144 05/22/2023   K 4.4 05/22/2023   CL 104 05/22/2023   CO2 24 05/22/2023   Lab Results  Component Value Date   ALT 17 05/22/2023   AST 14 05/22/2023   ALKPHOS 71 05/22/2023   BILITOT 0.7 05/22/2023   Lab Results  Component Value Date   HGBA1C 6.9 (H) 05/22/2023   HGBA1C 6.6 (A) 01/15/2023   HGBA1C 6.5 (H) 12/11/2022   HGBA1C 6.7 (A) 09/11/2022   HGBA1C 8.3 (A) 06/12/2022   Lab Results  Component Value Date   INSULIN 16.0 05/22/2023   INSULIN 17.3 12/11/2022   INSULIN 12.5 03/13/2022   INSULIN 13.8 05/02/2021   INSULIN 14.2 03/15/2020   Lab Results  Component Value Date   TSH 1.280 06/24/2019   Lab Results  Component Value Date   CHOL 152 06/12/2022   HDL 45 06/12/2022   LDLCALC 86 06/12/2022   TRIG 116 06/12/2022   CHOLHDL 3.4 06/12/2022   Lab Results  Component Value Date   VD25OH 55.1 05/22/2023   VD25OH 76.3 12/11/2022   VD25OH 41.8 06/12/2022   Lab Results  Component Value Date   WBC 3.7 06/12/2022   HGB 13.3 06/12/2022   HCT 40.2 06/12/2022   MCV 92 06/12/2022   PLT 211 06/12/2022   No results found for: "IRON", "TIBC", "FERRITIN"  Attestation Statements:   Reviewed by clinician on day of visit: allergies, medications, problem list, medical history, surgical history, family history,  social history, and previous encounter notes.  I have reviewed the above documentation for accuracy and completeness, and I agree with the above. -   Alicia Lowery d. Alicia Schwarz, NP-C

## 2023-07-26 ENCOUNTER — Other Ambulatory Visit (HOSPITAL_COMMUNITY): Payer: Self-pay

## 2023-08-06 ENCOUNTER — Telehealth: Payer: Self-pay | Admitting: *Deleted

## 2023-08-06 NOTE — Telephone Encounter (Signed)
 Copied from CRM [id number]. Topic: Clinical - Prescription Issue >> Aug 06, 2023 12:54 PM Fuller Mandril wrote: Reason for CRM: Patient called states pharmacy had her to reach out because they are not able to fill Dulaglutide (TRULICITY) 3 MG/0.5ML SOAJ due to authorization needed. States only the lower dose was approved. Has not taking any since last dose of 1.5. Thank You  I don't see any PA information in chart regarding this medication. Can you please assist? Thanks.

## 2023-08-07 ENCOUNTER — Telehealth: Payer: Self-pay

## 2023-08-07 ENCOUNTER — Other Ambulatory Visit (HOSPITAL_COMMUNITY): Payer: Self-pay

## 2023-08-07 NOTE — Telephone Encounter (Signed)
 Pharmacy Patient Advocate Encounter   Received notification from Physician's Office that prior authorization for Trulicity 3MG /0.5ML auto-injectors is required/requested.   Insurance verification completed.   The patient is insured through [facility] .   Per test claim: PA required; PA submitted to above mentioned insurance via CoverMyMeds Key/confirmation #/EOC (Key: [id number]) Status is pending

## 2023-08-07 NOTE — Telephone Encounter (Signed)
 Pharmacy Patient Advocate Encounter  Received notification from [facility] that Prior Authorization for Trulicity 3MG /0.5ML auto-injectorshas been APPROVED from 3.26.25 to 3.26.26   PA #/Case ID/Reference #: (Key: [id number])

## 2023-08-22 ENCOUNTER — Telehealth: Payer: Self-pay | Admitting: Internal Medicine

## 2023-08-22 NOTE — Telephone Encounter (Unsigned)
 Copied from CRM [id number]. Topic: Clinical - Medical Advice >> Aug 22, 2023  9:21 AM Antwanette L wrote: Reason for CRM: Patient is calling to see if she needs to be revaccinated for measles? 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 Patient is requesting a callback at [phone]

## 2023-08-23 NOTE — Telephone Encounter (Signed)
 Left detailed message for pt

## 2023-08-27 ENCOUNTER — Ambulatory Visit (INDEPENDENT_AMBULATORY_CARE_PROVIDER_SITE_OTHER): Admitting: Adult Health

## 2023-08-27 ENCOUNTER — Encounter (INDEPENDENT_AMBULATORY_CARE_PROVIDER_SITE_OTHER): Payer: Self-pay | Admitting: Adult Health

## 2023-08-27 VITALS — BP 109/69 | HR 82 | Temp 98.2°F | Ht 62.0 in | Wt 237.0 lb

## 2023-08-27 DIAGNOSIS — E559 Vitamin D deficiency, unspecified: Secondary | ICD-10-CM

## 2023-08-27 DIAGNOSIS — E66813 Obesity, class 3: Secondary | ICD-10-CM | POA: Diagnosis not present

## 2023-08-27 DIAGNOSIS — I152 Hypertension secondary to endocrine disorders: Secondary | ICD-10-CM

## 2023-08-27 DIAGNOSIS — Z7985 Long-term (current) use of injectable non-insulin antidiabetic drugs: Secondary | ICD-10-CM | POA: Diagnosis not present

## 2023-08-27 DIAGNOSIS — Z6841 Body Mass Index (BMI) 40.0 and over, adult: Secondary | ICD-10-CM

## 2023-08-27 DIAGNOSIS — Z7984 Long term (current) use of oral hypoglycemic drugs: Secondary | ICD-10-CM

## 2023-08-27 DIAGNOSIS — E1159 Type 2 diabetes mellitus with other circulatory complications: Secondary | ICD-10-CM | POA: Diagnosis not present

## 2023-08-27 DIAGNOSIS — M109 Gout, unspecified: Secondary | ICD-10-CM

## 2023-08-27 DIAGNOSIS — E118 Type 2 diabetes mellitus with unspecified complications: Secondary | ICD-10-CM

## 2023-08-27 MED ORDER — VITAMIN D (ERGOCALCIFEROL) 1.25 MG (50000 UNIT) PO CAPS: ORAL_CAPSULE | ORAL | 0 refills | Status: DC

## 2023-08-27 NOTE — Progress Notes (Signed)
 WEIGHT SUMMARY AND BIOMETRICS  Vitals Temp: 98.2 F (36.8 C) BP: 109/69 Pulse Rate: 82 SpO2: 95 %   Anthropometric Measurements Height: 5\' 2"  (1.575 m) Weight: 237 lb (107.5 kg) BMI (Calculated): 43.34 Weight at Last Visit: 234 lb Weight Lost Since Last Visit: 0 Weight Gained Since Last Visit: 3 lb Starting Weight: 246 lb Total Weight Loss (lbs): 9 lb (4.082 kg)   Body Composition  Body Fat %: 51.2 % Fat Mass (lbs): 121.6 lbs Muscle Mass (lbs): 110.2 lbs Visceral Fat Rating : 18   Other Clinical Data Fasting: no Labs: no Today's Visit #: 60 Starting Date: 06/24/19    Chief Complaint:   OBESITY Alicia Lowery is here to discuss her progress with her obesity treatment plan.  She is on the the Category 2 Plan and states she is following her eating plan approximately 85 % of the time.  She states she is exercising Walking 30-40 minutes 4 times per week.   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 Hypertension associated with diabetes (HCC) BP at goal at OV She denies CP with exertion She estimates to drink She is currently on: metFORMIN (GLUCOPHAGE) 500 MG tablet  furosemide (LASIX) 20 MG tablet  lisinopril-hydrochlorothiazide (ZESTORETIC) 20-12.5 MG tablet  rosuvastatin (CRESTOR) 20 MG tablet  Dulaglutide (TRULICITY) 3 MG/0.5ML SOAJ   2. Type 2 diabetes mellitus with complications (HCC) PCP is managing daily  Metformin 500mg  BID and weekly Trulicty 3mg   Hunger/appetite-PCP recently increased Trulicity from 1.5mg  to 3mg  She has had doses 2 doses  Denies mass in neck, dysphagia, dyspepsia, persistent hoarseness, abdominal pain, or N/V/C   Of Note- Trulicity 3MG /0.5ML auto-injectorshas been APPROVED from 3.26.25 to 3.26.26   4. Vitamin D deficiency  Latest Reference Range & Units 06/12/22 12:00 12/11/22 11:07 05/22/23 08:39  Vitamin D, 25-Hydroxy 30.0 - 100.0 ng/mL 41.8 76.3 55.1   She is on bi-weekly Ergocalciferol- denies N/V/Muscle Weakness  5. Gout, unspecified cause, unspecified chronicity, unspecified site She is not on Allpurinol  She estimates last Gout Flare (bilateral dorsum pedis)- Jan 2020 She has been trying to increase daily water intake, she believes to drink 2 x 16.9 oz bottles/day  Assessment/Plan:   1. Hypertension associated with diabetes (HCC) (Primary) Limit Na+ Remain well hydrated with water  Continue metFORMIN (GLUCOPHAGE) 500 MG tablet  furosemide (LASIX) 20 MG tablet  lisinopril-hydrochlorothiazide (ZESTORETIC) 20-12.5 MG tablet  rosuvastatin (CRESTOR) 20 MG tablet  Dulaglutide (TRULICITY) 3 MG/0.5ML SOAJ   2. Type 2 diabetes mellitus with complications (HCC)  3. Vitamin D deficiency Refill  Vitamin D, Ergocalciferol, (DRISDOL) 1.25 MG (50000 UNIT) CAPS capsule TAKE 1 CAPSULE EVERY 14 DAYS Dispense: 4 capsule, Refills: 0 ordered   4. Gout, unspecified cause, unspecified chronicity, unspecified site Avoid High Purine Foods  5. Class 3 severe obesity due to excess calories with serious comorbidity and body mass index (BMI) of 40.0 to 44.9 in adult [facility]), CURRENT BMI 43.5  Alicia Lowery is currently in the action stage of change. As such, her goal is to continue with weight loss efforts. She has agreed to the Category 2 Plan.   Multiple HandOuts provided to pt: Recipe Guide II Snack Sheets: 200 cal and 100 cal Dinner Ideas Exercise is Medicine  Exercise  goals: All adults should avoid inactivity. Some physical activity is better than none, and adults who participate in any amount of physical activity gain some health benefits. Adults should also include muscle-strengthening activities that involve all major muscle groups on 2 or more days a week.  Behavioral modification strategies: increasing lean protein intake, decreasing simple carbohydrates, increasing vegetables, increasing water intake, decreasing eating out, no skipping meals, meal planning and cooking strategies, keeping healthy foods in the home, and planning for success.  Alicia Lowery has agreed to follow-up with our clinic in 4 weeks. She was informed of the importance of frequent follow-up visits to maximize her success with intensive lifestyle modifications for her multiple health conditions.   Check Fasting Labs at next OV  Objective:   Blood pressure 109/69, pulse 82, temperature 98.2 F (36.8 C), height 5\' 2"  (1.575 m), weight 237 lb (107.5 kg), last menstrual period 08/02/2016, SpO2 95%. Body mass index is 43.35 kg/m.  General: Cooperative, alert, well developed, in no acute distress. HEENT: Conjunctivae and lids unremarkable. Cardiovascular: Regular rhythm.  Lungs: Normal work of breathing. Neurologic: No focal deficits.   Lab Results  Component Value Date   CREATININE 0.86 05/22/2023   BUN 16 05/22/2023   NA 144 05/22/2023   K 4.4 05/22/2023   CL 104 05/22/2023   CO2 24 05/22/2023   Lab Results  Component Value Date   ALT 17 05/22/2023   AST 14 05/22/2023   ALKPHOS 71 05/22/2023   BILITOT 0.7 05/22/2023   Lab Results  Component Value Date   HGBA1C 6.9 (H) 05/22/2023   HGBA1C 6.6 (A) 01/15/2023   HGBA1C 6.5 (H) 12/11/2022   HGBA1C 6.7 (A) 09/11/2022   HGBA1C 8.3 (A) 06/12/2022   Lab Results  Component Value Date   INSULIN 16.0 05/22/2023   INSULIN 17.3 12/11/2022   INSULIN 12.5 03/13/2022   INSULIN 13.8 05/02/2021   INSULIN 14.2 03/15/2020   Lab  Results  Component Value Date   TSH 1.280 06/24/2019   Lab Results  Component Value Date   CHOL 152 06/12/2022   HDL 45 06/12/2022   LDLCALC 86 06/12/2022   TRIG 116 06/12/2022   CHOLHDL 3.4 06/12/2022   Lab Results  Component Value Date   VD25OH 55.1 05/22/2023   VD25OH 76.3 12/11/2022   VD25OH 41.8 06/12/2022   Lab Results  Component Value Date   WBC 3.7 06/12/2022   HGB 13.3 06/12/2022   HCT 40.2 06/12/2022   MCV 92 06/12/2022   PLT 211 06/12/2022   No results found for: "IRON", "TIBC", "FERRITIN"  Attestation Statements:   Reviewed by clinician on day of visit: allergies, medications, problem list, medical history, surgical history, family history, social history, and previous encounter notes.  I have reviewed the above documentation for accuracy and completeness, and I agree with the above. -  Jaquita Bessire d. Walterine Amodei, NP-C

## 2023-09-12 ENCOUNTER — Other Ambulatory Visit: Payer: Self-pay | Admitting: Family Medicine

## 2023-09-12 DIAGNOSIS — E118 Type 2 diabetes mellitus with unspecified complications: Secondary | ICD-10-CM

## 2023-10-02 ENCOUNTER — Encounter (INDEPENDENT_AMBULATORY_CARE_PROVIDER_SITE_OTHER): Payer: Self-pay | Admitting: Adult Health

## 2023-10-02 ENCOUNTER — Ambulatory Visit (INDEPENDENT_AMBULATORY_CARE_PROVIDER_SITE_OTHER): Admitting: Adult Health

## 2023-10-02 VITALS — BP 113/70 | HR 68 | Temp 97.7°F | Ht 62.0 in | Wt 236.0 lb

## 2023-10-02 DIAGNOSIS — Z7985 Long-term (current) use of injectable non-insulin antidiabetic drugs: Secondary | ICD-10-CM

## 2023-10-02 DIAGNOSIS — E1159 Type 2 diabetes mellitus with other circulatory complications: Secondary | ICD-10-CM

## 2023-10-02 DIAGNOSIS — Z6841 Body Mass Index (BMI) 40.0 and over, adult: Secondary | ICD-10-CM | POA: Diagnosis not present

## 2023-10-02 DIAGNOSIS — E66813 Obesity, class 3: Secondary | ICD-10-CM

## 2023-10-02 DIAGNOSIS — E118 Type 2 diabetes mellitus with unspecified complications: Secondary | ICD-10-CM

## 2023-10-02 DIAGNOSIS — Z7984 Long term (current) use of oral hypoglycemic drugs: Secondary | ICD-10-CM | POA: Diagnosis not present

## 2023-10-02 DIAGNOSIS — I152 Hypertension secondary to endocrine disorders: Secondary | ICD-10-CM | POA: Diagnosis not present

## 2023-10-02 DIAGNOSIS — E559 Vitamin D deficiency, unspecified: Secondary | ICD-10-CM | POA: Diagnosis not present

## 2023-10-02 MED ORDER — VITAMIN D (ERGOCALCIFEROL) 1.25 MG (50000 UNIT) PO CAPS: ORAL_CAPSULE | ORAL | 0 refills | Status: DC

## 2023-10-02 NOTE — Progress Notes (Signed)
 WEIGHT SUMMARY AND BIOMETRICS  Vitals Temp: 97.7 F (36.5 C) BP: 113/70 Pulse Rate: 68 SpO2: 97 %   Anthropometric Measurements Height: 5\' 2"  (1.575 m) Weight: 236 lb (107 kg) BMI (Calculated): 43.15 Weight at Last Visit: 237 lb Weight Lost Since Last Visit: 1 lb Weight Gained Since Last Visit: 0 Starting Weight: 246 lb Total Weight Loss (lbs): 10 lb (4.536 kg)   Body Composition  Body Fat %: 50.8 % Fat Mass (lbs): 120.2 lbs Muscle Mass (lbs): 110.8 lbs Total Body Water (lbs): 90.4 lbs Visceral Fat Rating : 18   Other Clinical Data Fasting: yes Labs: yes Today's Visit #: 39 Starting Date: 06/24/19 Comments: Labs today    Chief Complaint:   OBESITY Alicia Lowery is here to discuss her progress with her obesity treatment plan.  She is on the the Category 2 Plan and states she is following her eating plan approximately 80 % of the time.  She states she is exercising Walking 30 minutes 5 times per week.  Interim History:  Alicia Lowery will retire October 23, 2023 She will travel to visit her sister in [place] soon there after.  Retirement will offer her more time to focus on her health and well being.  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 Hypertension associated with diabetes (HCC) BP at goal at OV She denies tobacco/vape use She denies CP with exertion She is able to walk briskly several times per week  2. Type 2 diabetes mellitus with complications (HCC) PCP manages Metformin  500mg  BID and weekly Trulicity  Home fasting CBG will range from 90s-110s She denies sx's of hypoglycemia  3. Vitamin D  deficiency  Latest Reference Range & Units 06/12/22 12:00 12/11/22 11:07 05/22/23 08:39  Vitamin D , 25-Hydroxy 30.0 - 100.0 ng/mL 41.8 76.3 55.1   She endorses stable energy levels She is on weekly Ergocalciferol - denies N/V/Muscle Weakness  Assessment/Plan:   1. Hypertension associated with diabetes (HCC) (Primary) Check  Labs - Comprehensive metabolic panel with GFR  2. Type 2 diabetes mellitus with complications (HCC) Check Labs - Hemoglobin A1c - Insulin , random  3. Vitamin D  deficiency Check Labs - VITAMIN D  25 Hydroxy (Vit-D Deficiency, Fractures) Refill - Vitamin D , Ergocalciferol , (DRISDOL ) 1.25 MG (50000 UNIT) CAPS capsule; TAKE 1 CAPSULE  EVERY 14 DAYS  Dispense: 4 capsule; Refill: 0  5. Class 3 severe obesity due to excess calories with serious comorbidity and body mass index (BMI) of 40.0 to 44.9 in adult [facility]), CURRENT BMI 43.3  Alicia Lowery is currently in the action stage of change. As such, her goal is to continue with weight loss efforts. She has agreed to the Category 2 Plan.   Exercise goals: All adults should avoid inactivity. Some physical activity is better than none, and adults who participate in any amount of physical activity gain some health benefits. Adults should also include muscle-strengthening activities that involve all major muscle groups on 2 or more days a week.  Behavioral modification strategies: increasing lean protein intake, decreasing simple carbohydrates, increasing vegetables, increasing water intake, no skipping meals, meal planning and cooking strategies, keeping healthy foods in the home, ways to avoid boredom eating, and planning for success.  Alicia Lowery has agreed to follow-up with our clinic in 4 weeks. She was informed of the importance of frequent follow-up visits to maximize her success with intensive lifestyle modifications for her multiple health conditions.   Alicia Lowery was informed we would discuss her lab results  at her next visit unless there is a critical issue that needs to be addressed sooner. Sparkle agreed to keep her next visit at the agreed upon time to discuss these results.  Objective:   Blood pressure 113/70, pulse 68, temperature 97.7 F (36.5 C), height 5\' 2"  (1.575 m), weight 236 lb (107 kg), last menstrual period 08/02/2016, SpO2 97%. Body mass  index is 43.16 kg/m.  General: Cooperative, alert, well developed, in no acute distress. HEENT: Conjunctivae and lids unremarkable. Cardiovascular: Regular rhythm.  Lungs: Normal work of breathing. Neurologic: No focal deficits.   Lab Results  Component Value Date   CREATININE 0.86 05/22/2023   BUN 16 05/22/2023   NA 144 05/22/2023   K 4.4 05/22/2023   CL 104 05/22/2023   CO2 24 05/22/2023   Lab Results  Component Value Date   ALT 17 05/22/2023   AST 14 05/22/2023   ALKPHOS 71 05/22/2023   BILITOT 0.7 05/22/2023   Lab Results  Component Value Date   HGBA1C 6.9 (H) 05/22/2023   HGBA1C 6.6 (A) 01/15/2023   HGBA1C 6.5 (H) 12/11/2022   HGBA1C 6.7 (A) 09/11/2022   HGBA1C 8.3 (A) 06/12/2022   Lab Results  Component Value Date   INSULIN  16.0 05/22/2023   INSULIN  17.3 12/11/2022   INSULIN  12.5 03/13/2022   INSULIN  13.8 05/02/2021   INSULIN  14.2 03/15/2020   Lab Results  Component Value Date   TSH 1.280 06/24/2019   Lab Results  Component Value Date   CHOL 152 06/12/2022   HDL 45 06/12/2022   LDLCALC 86 06/12/2022   TRIG 116 06/12/2022   CHOLHDL 3.4 06/12/2022   Lab Results  Component Value Date   VD25OH 55.1 05/22/2023   VD25OH 76.3 12/11/2022   VD25OH 41.8 06/12/2022   Lab Results  Component Value Date   WBC 3.7 06/12/2022   HGB 13.3 06/12/2022   HCT 40.2 06/12/2022   MCV 92 06/12/2022   PLT 211 06/12/2022   No results found for: "IRON", "TIBC", "FERRITIN"  Attestation Statements:   Reviewed by clinician on day of visit: allergies, medications, problem list, medical history, surgical history, family history, social history, and previous encounter notes.  I have reviewed the above documentation for accuracy and completeness, and I agree with the above. -  Jnae Thomaston d. Jazmine Heckman, NP-C

## 2023-10-03 LAB — INSULIN, RANDOM: INSULIN: 22 u[IU]/mL (ref 2.6–24.9)

## 2023-10-03 LAB — COMPREHENSIVE METABOLIC PANEL WITH GFR
ALT: 25 IU/L (ref 0–32)
AST: 21 IU/L (ref 0–40)
Albumin: 4.4 g/dL (ref 3.9–4.9)
Alkaline Phosphatase: 68 IU/L (ref 44–121)
BUN/Creatinine Ratio: 17 (ref 12–28)
BUN: 13 mg/dL (ref 8–27)
Bilirubin Total: 0.7 mg/dL (ref 0.0–1.2)
CO2: 23 mmol/L (ref 20–29)
Calcium: 10 mg/dL (ref 8.7–10.3)
Chloride: 105 mmol/L (ref 96–106)
Creatinine, Ser: 0.78 mg/dL (ref 0.57–1.00)
Globulin, Total: 2.5 g/dL (ref 1.5–4.5)
Glucose: 102 mg/dL — ABNORMAL HIGH (ref 70–99)
Potassium: 4 mmol/L (ref 3.5–5.2)
Sodium: 143 mmol/L (ref 134–144)
Total Protein: 6.9 g/dL (ref 6.0–8.5)
eGFR: 85 mL/min/{1.73_m2} (ref >59)

## 2023-10-03 LAB — VITAMIN D 25 HYDROXY (VIT D DEFICIENCY, FRACTURES): Vit D, 25-Hydroxy: 47.9 ng/mL (ref 30.0–100.0)

## 2023-10-03 LAB — HEMOGLOBIN A1C
Est. average glucose Bld gHb Est-mCnc: 134 mg/dL
Hgb A1c MFr Bld: 6.3 % — ABNORMAL HIGH (ref 4.8–5.6)

## 2023-10-21 ENCOUNTER — Other Ambulatory Visit: Payer: Self-pay | Admitting: Family Medicine

## 2023-10-21 DIAGNOSIS — E118 Type 2 diabetes mellitus with unspecified complications: Secondary | ICD-10-CM

## 2023-10-22 NOTE — Telephone Encounter (Signed)
 Left voicemail for patient

## 2023-10-23 NOTE — Telephone Encounter (Signed)
 Spoke with patient and she will pick up prescription of the 3mg  tomorrow as they had to order this medication.

## 2023-11-07 ENCOUNTER — Encounter (INDEPENDENT_AMBULATORY_CARE_PROVIDER_SITE_OTHER): Payer: Self-pay | Admitting: Family Medicine

## 2023-11-07 ENCOUNTER — Ambulatory Visit (INDEPENDENT_AMBULATORY_CARE_PROVIDER_SITE_OTHER): Admitting: Family Medicine

## 2023-11-07 VITALS — BP 106/70 | HR 75 | Temp 98.1°F | Ht 62.0 in | Wt 239.0 lb

## 2023-11-07 DIAGNOSIS — Z6841 Body Mass Index (BMI) 40.0 and over, adult: Secondary | ICD-10-CM

## 2023-11-07 DIAGNOSIS — Z7985 Long-term (current) use of injectable non-insulin antidiabetic drugs: Secondary | ICD-10-CM | POA: Diagnosis not present

## 2023-11-07 DIAGNOSIS — E559 Vitamin D deficiency, unspecified: Secondary | ICD-10-CM | POA: Diagnosis not present

## 2023-11-07 DIAGNOSIS — Z7984 Long term (current) use of oral hypoglycemic drugs: Secondary | ICD-10-CM | POA: Diagnosis not present

## 2023-11-07 DIAGNOSIS — E669 Obesity, unspecified: Secondary | ICD-10-CM | POA: Diagnosis not present

## 2023-11-07 DIAGNOSIS — E1159 Type 2 diabetes mellitus with other circulatory complications: Secondary | ICD-10-CM

## 2023-11-07 DIAGNOSIS — I152 Hypertension secondary to endocrine disorders: Secondary | ICD-10-CM | POA: Diagnosis not present

## 2023-11-07 DIAGNOSIS — E118 Type 2 diabetes mellitus with unspecified complications: Secondary | ICD-10-CM

## 2023-11-07 MED ORDER — VITAMIN D (ERGOCALCIFEROL) 1.25 MG (50000 UNIT) PO CAPS: ORAL_CAPSULE | ORAL | 0 refills | Status: DC

## 2023-11-07 NOTE — Progress Notes (Signed)
 Alicia Lowery, D.O.  ABFM, ABOM Specializing in Clinical Bariatric Medicine  Office located at: [address] [place], [place]  [address]   Assessment and Plan:  No orders of the defined types were placed in this encounter.  Medications Discontinued During This Encounter  Medication Reason   Vitamin D , Ergocalciferol , (DRISDOL ) 1.25 MG (50000 UNIT) CAPS capsule Reorder    Meds ordered this encounter  Medications   Vitamin D , Ergocalciferol , (DRISDOL ) 1.25 MG (50000 UNIT) CAPS capsule    Sig: TAKE 1 CAPSULE  EVERY 14 DAYS    Dispense:  6 capsule    Refill:  0      FOR THE DISEASE OF OBESITY: Obesity, Starting BMI 44.99 BMI 40.0-44.9, adult (HCC) current 43.71 Assessment & Plan: Since last office visit with Alicia Dalton NP on 5/21 patient's muscle mass has increased by 7.6 lbs. Fat mass has decreased by 5.6 lbs. Total body water has increased by 8. lbs.  Counseling done on how various foods will affect these numbers and how to maximize success  Total lbs lost to date: 7 lbs  Total weight loss percentage to date: -2.85%    Recommended Dietary Goals Alicia Lowery is currently in the action stage of change. As such, her goal is to continue weight management plan.  She has agreed to: continue current plan   Behavioral Intervention We discussed the following today: increasing lean protein intake to established goals, decreasing simple carbohydrates , increasing vegetables, increasing lower glycemic fruits, increasing fiber rich foods, avoiding skipping meals, increasing water intake , keeping healthy foods at home, work on managing stress, creating time for self-care and relaxation, and avoiding temptations and identifying enticing environmental cues  Additional resources provided today: None  Evidence-based interventions for health behavior change were utilized today including the discussion of self monitoring techniques, problem-solving barriers and SMART goal setting  techniques.   Regarding patient's less desirable eating habits and patterns, we employed the technique of small changes.   Pt will specifically work on: n/a    Recommended Physical Activity Goals Alicia Lowery has been advised to work up to 300-450 minutes of moderate intensity aerobic activity a week and strengthening exercises 2-3 times per week for cardiovascular health, weight loss maintenance and preservation of muscle mass.   She has agreed to: Continue current level of physical activity    Pharmacotherapy We both agreed to : Continue with current nutritional and behavioral strategies   ASSOCIATED CONDITIONS ADDRESSED TODAY:  Vitamin D  deficiency Assessment & Plan: Lab Results  Component Value Date   VD25OH 47.9 10/02/2023   VD25OH 55.1 05/22/2023   VD25OH 76.3 12/11/2022   Taking ERGO 50K once every 14 days. Good compliance and tolerance; no adverse SE. No acute concerns. Continue supplementation at current dose. Will refill ERGO with no changes made.     Hypertension associated with diabetes [facility]) Assessment & Plan: BP Readings from Last 3 Encounters:  11/07/23 106/70  10/02/23 113/70  08/27/23 109/69   Currently on Lisinopril -HCTZ 20-12.5 mg daily and Lasix  20 mg once every 3 days. Tolerating well with no SE. She is only drinking 60 ounces of water per day. No hx of CHF. Encouraged pt to drink at least half her body weight in ounces of water per day and drink an extra bottle of water for every 30 minutes of exercise. Reviewed how increased water intake will also help avoid muscle cramps. Continue with current antihypertensive regimen as prescribed. Will continue to work on following heart-healthy diet per  her nutritional meal plan.     Type 2 diabetes mellitus with complications [facility] Assessment & Plan: Lab Results  Component Value Date   HGBA1C 6.3 (H) 10/02/2023   HGBA1C 6.9 (H) 05/22/2023   HGBA1C 6.6 (A) 01/15/2023   INSULIN  22.0 10/02/2023   INSULIN  16.0  05/22/2023   INSULIN  17.3 12/11/2022    A1c has improved from 6.9 in 05/2023 to 6.3 on 10/02/2023. Pt is on Metformin  500 mg BID and Trulicity  3 mg, both managed by PCP. Has been on current dose of Trulicity  for about 3 months. Denies any sx of hypoglycemia/hyperglycemia. Follow her meal plan --> decrease simple carbs/sugars, increase water intake, and avoid skipping meals. Continue with current medication regimen as prescribed.    Follow up:   Return in about 4 weeks (around 12/05/2023) for 4-6 week follow up wtih Alicia Dalton NP. She was informed of the importance of frequent follow up visits to maximize her success with intensive lifestyle modifications for her multiple health conditions.  Subjective:   Chief complaint: Obesity Alicia Lowery is here to discuss her progress with her obesity treatment plan. She is on the Category 2 Plan and states she is following her eating plan approximately 80% of the time. She states she is walking 20 minutes 5 days per week.   Interval History:  Alicia Lowery is here for a follow up office visit. Since last OV with Alicia Dalton NP on 5/21, she is up 3 lbs. Not eating adequate amounts of protein and not drinking enough water; 60 oz water pre day. She thinks she is retired. 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  Pharmacotherapy for weight loss: She is currently taking Metformin  500mg  BID.   Review of Systems:  Pertinent positives were addressed with patient today.  Reviewed by clinician on day of visit: allergies, medications, problem list, medical history, surgical history, family history, social history, and previous encounter notes.  Weight Summary and Biometrics   Weight Lost Since Last Visit: 0lb  Weight Gained Since Last Visit: 3lb   Vitals Temp: 98.1 F (36.7 C) BP: 106/70 Pulse Rate: 75 SpO2: 97 %   Anthropometric Measurements Height: 5' 2 (1.575 m) Weight: 239 lb  (108.4 kg) BMI (Calculated): 43.7 Weight at Last Visit: 236lb Weight Lost Since Last Visit: 0lb Weight Gained Since Last Visit: 3lb Starting Weight: 246lb Total Weight Loss (lbs): 7 lb (3.175 kg)   Body Composition  Body Fat %: 47.9 % Fat Mass (lbs): 114.6 lbs Muscle Mass (lbs): 118.4 lbs Total Body Water (lbs): 98.6 lbs Visceral Fat Rating : 17   Other Clinical Data Fasting: No Labs: No Today's Visit #: 58 Starting Date: 06/24/19 Comments: Cat 2    Objective:   PHYSICAL EXAM: Blood pressure 106/70, pulse 75, temperature 98.1 F (36.7 C), height 5' 2 (1.575 m), weight 239 lb (108.4 kg), last menstrual period 08/02/2016, SpO2 97%. Body mass index is 43.71 kg/m.  General: she is overweight, cooperative and in no acute distress. PSYCH: Has normal mood, affect and thought process.   HEENT: EOMI, sclerae are anicteric. Lungs: Normal breathing effort, no conversational dyspnea. Extremities: Moves * 4 Neurologic: A and O * 3, good insight  DIAGNOSTIC DATA REVIEWED: BMET    Component Value Date/Time   NA 143 10/02/2023 1034   K 4.0 10/02/2023 1034   CL 105 10/02/2023 1034   CO2 23 10/02/2023 1034   GLUCOSE 102 (H)  10/02/2023 1034   GLUCOSE 99 04/16/2017 1434   BUN 13 10/02/2023 1034   CREATININE 0.78 10/02/2023 1034   CREATININE 0.79 04/16/2017 1434   CALCIUM  10.0 10/02/2023 1034   GFRNONAA 68 03/15/2020 1237   GFRAA 78 03/15/2020 1237   Lab Results  Component Value Date   HGBA1C 6.3 (H) 10/02/2023   HGBA1C 6.2 % 09/28/2010   Lab Results  Component Value Date   INSULIN  22.0 10/02/2023   INSULIN  17.6 06/24/2019   Lab Results  Component Value Date   TSH 1.280 06/24/2019   CBC    Component Value Date/Time   WBC 3.7 06/12/2022 1200   WBC 4.0 04/16/2017 1434   RBC 4.39 06/12/2022 1200   RBC 4.37 04/16/2017 1434   HGB 13.3 06/12/2022 1200   HCT 40.2 06/12/2022 1200   PLT 211 06/12/2022 1200   MCV 92 06/12/2022 1200   MCH 30.3 06/12/2022 1200    MCH 30.7 04/16/2017 1434   MCHC 33.1 06/12/2022 1200   MCHC 34.3 04/16/2017 1434   RDW 12.6 06/12/2022 1200   Iron Studies No results found for: IRON, TIBC, FERRITIN, IRONPCTSAT Lipid Panel     Component Value Date/Time   CHOL 152 06/12/2022 1200   TRIG 116 06/12/2022 1200   HDL 45 06/12/2022 1200   CHOLHDL 3.4 06/12/2022 1200   CHOLHDL 5.3 (H) 04/16/2017 1434   VLDL 25 03/04/2015 0001   LDLCALC 86 06/12/2022 1200   LDLCALC 155 (H) 04/16/2017 1434   Hepatic Function Panel     Component Value Date/Time   PROT 6.9 10/02/2023 1034   ALBUMIN 4.4 10/02/2023 1034   AST 21 10/02/2023 1034   ALT 25 10/02/2023 1034   ALKPHOS 68 10/02/2023 1034   BILITOT 0.7 10/02/2023 1034      Component Value Date/Time   TSH 1.280 06/24/2019 1147   Nutritional Lab Results  Component Value Date   VD25OH 47.9 10/02/2023   VD25OH 55.1 05/22/2023   VD25OH 76.3 12/11/2022    Attestations:   I, Vernell Forest, acting as a Stage manager for Alicia Jenkins, DO., have compiled all relevant documentation for today's office visit on behalf of Alicia Jenkins, DO, while in the presence of [employer], DO.  I have reviewed the above documentation for accuracy and completeness, and I agree with the above. Alicia JINNY Lowery, D.O.  The 21st Century Cures Act was signed into law in 2016 which includes the topic of electronic health records.  This provides immediate access to information in MyChart.  This includes consultation notes, operative notes, office notes, lab results and pathology reports.  If you have any questions about what you read please let us  know at your next visit so we can discuss your concerns and take corrective action if need be.  We are right here with you.

## 2023-11-19 NOTE — Progress Notes (Incomplete)
 Alicia Lowery, D.O.  ABFM, ABOM Specializing in Clinical Bariatric Medicine  Office located at: [address] [place], [place]  [address]   Assessment and Plan:  No orders of the defined types were placed in this encounter.  Medications Discontinued During This Encounter  Medication Reason  . Vitamin D , Ergocalciferol , (DRISDOL ) 1.25 MG (50000 UNIT) CAPS capsule Reorder    Meds ordered this encounter  Medications  . Vitamin D , Ergocalciferol , (DRISDOL ) 1.25 MG (50000 UNIT) CAPS capsule    Sig: TAKE 1 CAPSULE  EVERY 14 DAYS    Dispense:  6 capsule    Refill:  0      FOR THE DISEASE OF OBESITY: Obesity, Starting BMI 44.99 BMI 40.0-44.9, adult (HCC) current 43.71 Assessment & Plan: Since last office visit with Rockie Dalton NP on 5/21 patient's muscle mass has increased by 7.6 lbs. Fat mass has decreased by 5.6 lbs. Total body water has increased by 8. lbs.  Counseling done on how various foods will affect these numbers and how to maximize success  Total lbs lost to date: 7 lbs  Total weight loss percentage to date: -2.85%    Recommended Dietary Goals Alicia Lowery is currently in the action stage of change. As such, her goal is to continue weight management plan.  She has agreed to: continue current plan   Behavioral Intervention We discussed the following today: {[email or web address]:[id number]}  Additional resources provided today: None  Evidence-based interventions for health behavior change were utilized today including the discussion of self monitoring techniques, problem-solving barriers and SMART goal setting techniques.   Regarding patient's less desirable eating habits and patterns, we employed the technique of small changes.   Pt will specifically work on: n/a    Recommended Physical Activity Goals Alicia Lowery has been advised to work up to 300-450 minutes of moderate intensity aerobic activity a week and strengthening exercises 2-3 times per week for cardiovascular  health, weight loss maintenance and preservation of muscle mass.   She has agreed to: Continue current level of physical activity    Pharmacotherapy We both agreed to : Continue with current nutritional and behavioral strategies   ASSOCIATED CONDITIONS ADDRESSED TODAY: Vitamin D  deficiency Assessment & Plan: Lab Results  Component Value Date   VD25OH 47.9 10/02/2023   VD25OH 55.1 05/22/2023   VD25OH 76.3 12/11/2022   Taking ERGO 50K once every 14 days. Good compliance and tolerance; no adverse SE. No acute concerns. Continue supplementation at current dose. Will refill ERGO with no changes made.     Hypertension associated with diabetes [facility]) Assessment & Plan: BP Readings from Last 3 Encounters:  11/07/23 106/70  10/02/23 113/70  08/27/23 109/69   Currently on Lisinopril -HCTZ 20-12.5 mg daily and Lasix  20 mg once every 3 days. Tolerating well with no SE. She is only drinking 60 ounces of water per day. No hx of CHF. Encouraged pt to drink at least half her body weight in ounces of water per day and drink an extra bottle of water for every 30 minutes of exercise. Reviewed how increased water intake will also help avoid muscle cramps. Continue with current antihypertensive regimen as prescribed. Will continue to work on following heart-healthy diet per her nutritional meal plan.     Type 2 diabetes mellitus with complications [facility]) Assessment & Plan: Lab Results  Component Value Date   HGBA1C 6.3 (H) 10/02/2023   HGBA1C 6.9 (H) 05/22/2023   HGBA1C 6.6 (A) 01/15/2023   INSULIN  22.0 10/02/2023   INSULIN  16.0  05/22/2023   INSULIN  17.3 12/11/2022    A1c has improved from 6.9 in 05/2023 to 6.3 on 10/02/2023. Pt is on Metformin  500 mg BID and Trulicity  3 mg, both managed by PCP. Has been on current dose of Trulicity  for about 3 months.          ***    Follow up:   Return in about 4 weeks (around 12/05/2023) for 4-6 week follow up wtih Rockie Dalton NP. She was informed  of the importance of frequent follow up visits to maximize her success with intensive lifestyle modifications for her multiple health conditions.  Subjective:   Chief complaint: Obesity Alicia Lowery is here to discuss her progress with her obesity treatment plan. She is on the Category 2 Plan and states she is following her eating plan approximately 80% of the time. She states she is walking 20 minutes 5 days per week.   Interval History:  Alicia Lowery is here for a follow up office visit. Since last OV with Rockie Dalton NP on 5/21, she is up 3 lbs. Not eating adequate amounts of protein and not drinking enough water; 60 oz water pre day. She thinks she is retired. 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  Pharmacotherapy for weight loss: She is currently taking Metformin  500mg  BID.   Review of Systems:  Pertinent positives were addressed with patient today.  Reviewed by clinician on day of visit: allergies, medications, problem list, medical history, surgical history, family history, social history, and previous encounter notes.  Weight Summary and Biometrics   Weight Lost Since Last Visit: 0lb  Weight Gained Since Last Visit: 3lb   Vitals Temp: 98.1 F (36.7 C) BP: 106/70 Pulse Rate: 75 SpO2: 97 %   Anthropometric Measurements Height: 5' 2 (1.575 m) Weight: 239 lb (108.4 kg) BMI (Calculated): 43.7 Weight at Last Visit: 236lb Weight Lost Since Last Visit: 0lb Weight Gained Since Last Visit: 3lb Starting Weight: 246lb Total Weight Loss (lbs): 7 lb (3.175 kg)   Body Composition  Body Fat %: 47.9 % Fat Mass (lbs): 114.6 lbs Muscle Mass (lbs): 118.4 lbs Total Body Water (lbs): 98.6 lbs Visceral Fat Rating : 17   Other Clinical Data Fasting: No Labs: No Today's Visit #: 58 Starting Date: 06/24/19 Comments: Cat 2    Objective:   PHYSICAL EXAM: Blood pressure 106/70, pulse 75, temperature 98.1 F  (36.7 C), height 5' 2 (1.575 m), weight 239 lb (108.4 kg), last menstrual period 08/02/2016, SpO2 97%. Body mass index is 43.71 kg/m.  General: she is overweight, cooperative and in no acute distress. PSYCH: Has normal mood, affect and thought process.   HEENT: EOMI, sclerae are anicteric. Lungs: Normal breathing effort, no conversational dyspnea. Extremities: Moves * 4 Neurologic: A and O * 3, good insight  DIAGNOSTIC DATA REVIEWED: BMET    Component Value Date/Time   NA 143 10/02/2023 1034   K 4.0 10/02/2023 1034   CL 105 10/02/2023 1034   CO2 23 10/02/2023 1034   GLUCOSE 102 (H) 10/02/2023 1034   GLUCOSE 99 04/16/2017 1434   BUN 13 10/02/2023 1034   CREATININE 0.78 10/02/2023 1034   CREATININE 0.79 04/16/2017 1434   CALCIUM  10.0 10/02/2023 1034   GFRNONAA 68 03/15/2020 1237   GFRAA 78 03/15/2020 1237   Lab Results  Component Value Date   HGBA1C 6.3 (H) 10/02/2023   HGBA1C 6.2 % 09/28/2010   Lab Results  Component Value Date   INSULIN  22.0 10/02/2023   INSULIN  17.6 06/24/2019   Lab Results  Component Value Date   TSH 1.280 06/24/2019   CBC    Component Value Date/Time   WBC 3.7 06/12/2022 1200   WBC 4.0 04/16/2017 1434   RBC 4.39 06/12/2022 1200   RBC 4.37 04/16/2017 1434   HGB 13.3 06/12/2022 1200   HCT 40.2 06/12/2022 1200   PLT 211 06/12/2022 1200   MCV 92 06/12/2022 1200   MCH 30.3 06/12/2022 1200   MCH 30.7 04/16/2017 1434   MCHC 33.1 06/12/2022 1200   MCHC 34.3 04/16/2017 1434   RDW 12.6 06/12/2022 1200   Iron Studies No results found for: IRON, TIBC, FERRITIN, IRONPCTSAT Lipid Panel     Component Value Date/Time   CHOL 152 06/12/2022 1200   TRIG 116 06/12/2022 1200   HDL 45 06/12/2022 1200   CHOLHDL 3.4 06/12/2022 1200   CHOLHDL 5.3 (H) 04/16/2017 1434   VLDL 25 03/04/2015 0001   LDLCALC 86 06/12/2022 1200   LDLCALC 155 (H) 04/16/2017 1434   Hepatic Function Panel     Component Value Date/Time   PROT 6.9 10/02/2023 1034    ALBUMIN 4.4 10/02/2023 1034   AST 21 10/02/2023 1034   ALT 25 10/02/2023 1034   ALKPHOS 68 10/02/2023 1034   BILITOT 0.7 10/02/2023 1034      Component Value Date/Time   TSH 1.280 06/24/2019 1147   Nutritional Lab Results  Component Value Date   VD25OH 47.9 10/02/2023   VD25OH 55.1 05/22/2023   VD25OH 76.3 12/11/2022    Attestations:   I, Vernell Forest, acting as a Stage manager for Alicia Jenkins, DO., have compiled all relevant documentation for today's office visit on behalf of Alicia Jenkins, DO, while in the presence of [employer], DO.  I have reviewed the above documentation for accuracy and completeness, and I agree with the above. Alicia Alicia Lowery, D.O.  The 21st Century Cures Act was signed into law in 2016 which includes the topic of electronic health records.  This provides immediate access to information in MyChart.  This includes consultation notes, operative notes, office notes, lab results and pathology reports.  If you have any questions about what you read please let us  know at your next visit so we can discuss your concerns and take corrective action if need be.  We are right here with you.

## 2023-11-29 ENCOUNTER — Encounter: Payer: Self-pay | Admitting: Advanced Practice Midwife

## 2023-12-19 ENCOUNTER — Encounter (INDEPENDENT_AMBULATORY_CARE_PROVIDER_SITE_OTHER): Payer: Self-pay | Admitting: Adult Health

## 2023-12-19 ENCOUNTER — Ambulatory Visit (INDEPENDENT_AMBULATORY_CARE_PROVIDER_SITE_OTHER): Admitting: Adult Health

## 2023-12-19 VITALS — BP 121/74 | HR 67 | Temp 98.2°F | Ht 62.0 in | Wt 240.0 lb

## 2023-12-19 DIAGNOSIS — Z6841 Body Mass Index (BMI) 40.0 and over, adult: Secondary | ICD-10-CM

## 2023-12-19 DIAGNOSIS — E559 Vitamin D deficiency, unspecified: Secondary | ICD-10-CM | POA: Diagnosis not present

## 2023-12-19 DIAGNOSIS — Z7984 Long term (current) use of oral hypoglycemic drugs: Secondary | ICD-10-CM

## 2023-12-19 DIAGNOSIS — I152 Hypertension secondary to endocrine disorders: Secondary | ICD-10-CM | POA: Diagnosis not present

## 2023-12-19 DIAGNOSIS — Z7985 Long-term (current) use of injectable non-insulin antidiabetic drugs: Secondary | ICD-10-CM | POA: Diagnosis not present

## 2023-12-19 DIAGNOSIS — E118 Type 2 diabetes mellitus with unspecified complications: Secondary | ICD-10-CM

## 2023-12-19 DIAGNOSIS — E1159 Type 2 diabetes mellitus with other circulatory complications: Secondary | ICD-10-CM

## 2023-12-19 DIAGNOSIS — E669 Obesity, unspecified: Secondary | ICD-10-CM | POA: Diagnosis not present

## 2023-12-19 MED ORDER — VITAMIN D (ERGOCALCIFEROL) 1.25 MG (50000 UNIT) PO CAPS: ORAL_CAPSULE | ORAL | 0 refills | Status: DC

## 2023-12-19 NOTE — Progress Notes (Signed)
 WEIGHT SUMMARY AND BIOMETRICS  Vitals Temp: 98.2 F (36.8 C) BP: 121/74 Pulse Rate: 67 SpO2: 96 %   Anthropometric Measurements Height: 5' 2 (1.575 m) Weight: 240 lb (108.9 kg) BMI (Calculated): 43.89 Weight at Last Visit: 239 lb Weight Lost Since Last Visit: 0 Weight Gained Since Last Visit: 1 lb Starting Weight: 246 lb Total Weight Loss (lbs): 6 lb (2.722 kg) Peak Weight: 246 lb   Body Composition  Body Fat %: 51.9 % Fat Mass (lbs): 124.8 lbs Muscle Mass (lbs): 110 lbs Visceral Fat Rating : 18   Other Clinical Data Fasting: no Labs: no Today's Visit #: 110 Starting Date: 06/24/19    Chief Complaint:   OBESITY Alicia Lowery is here to discuss her progress with her obesity treatment plan.  She is on the the Category 2 Plan and states she is following her eating plan approximately 80 % of the time.  She states she is exercising Walking/Stretching 30 minutes 4 times per week.  Interim History:  She will return to [employer] program 12/30/2023 She plans on working a short term, 3-4 weeks only then formally retire  She estimates to drink at least 90 oz water/day  She has been walking and stretching at least 4 times weekly  PCP manages daily Metformin  500mg  BID with meals and weekly Trulicity  3mg  Denies mass in neck, dysphagia, dyspepsia, persistent hoarseness, abdominal pain, or N/V/C   Subjective:   1. Vitamin D  deficiency  Latest Reference Range & Units 12/11/22 11:07 05/22/23 08:39 10/02/23 10:34  Vitamin D , 25-Hydroxy 30.0 - 100.0 ng/mL 76.3 55.1 47.9    2. 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 Vitamin D  deficiency Refill - Vitamin D , Ergocalciferol , (DRISDOL ) 1.25 MG (50000 UNIT) CAPS capsule; TAKE 1 CAPSULE  EVERY 14 DAYS  Dispense: 6 capsule; Refill: 0  2. 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 BMI 40.0-44.9, adult (HCC) current 44.0  Alicia Lowery is currently in the action stage of change. As such, her goal is to continue with weight loss efforts. She has agreed to the Category 2 Plan.   Exercise goals: All adults should avoid inactivity. Some physical activity is better than none, and adults  who participate in any amount of physical activity gain some health benefits. Adults should also include muscle-strengthening activities that involve all major muscle groups on 2 or more days a week.  Behavioral modification strategies: increasing lean protein intake, decreasing simple carbohydrates, increasing vegetables, increasing water intake, no skipping meals, meal planning and  cooking strategies, keeping healthy foods in the home, ways to avoid boredom eating, and planning for success.  Alicia Lowery has agreed to follow-up with our clinic in 4 weeks. She was informed of the importance of frequent follow-up visits to maximize her success with intensive lifestyle modifications for her multiple health conditions.   Objective:   Blood pressure 121/74, pulse 67, temperature 98.2 F (36.8 C), height 5' 2 (1.575 m), weight 240 lb (108.9 kg), last menstrual period 08/02/2016, SpO2 96%. Body mass index is 43.9 kg/m.  General: Cooperative, alert, well developed, in no acute distress. HEENT: Conjunctivae and lids unremarkable. Cardiovascular: Regular rhythm.  Lungs: Normal work of breathing. Neurologic: No focal deficits.   Lab Results  Component Value Date   CREATININE 0.78 10/02/2023   BUN 13 10/02/2023   NA 143 10/02/2023   K 4.0 10/02/2023   CL 105 10/02/2023   CO2 23 10/02/2023   Lab Results  Component Value Date   ALT 25 10/02/2023   AST 21 10/02/2023   ALKPHOS 68 10/02/2023   BILITOT 0.7 10/02/2023   Lab Results  Component Value Date   HGBA1C 6.3 (H) 10/02/2023   HGBA1C 6.9 (H) 05/22/2023   HGBA1C 6.6 (A) 01/15/2023   HGBA1C 6.5 (H) 12/11/2022   HGBA1C 6.7 (A) 09/11/2022   Lab Results  Component Value Date   INSULIN  22.0 10/02/2023   INSULIN  16.0 05/22/2023   INSULIN  17.3 12/11/2022   INSULIN  12.5 03/13/2022   INSULIN  13.8 05/02/2021   Lab Results  Component Value Date   TSH 1.280 06/24/2019   Lab Results  Component Value Date   CHOL 152 06/12/2022   HDL 45 06/12/2022   LDLCALC 86 06/12/2022   TRIG 116 06/12/2022   CHOLHDL 3.4 06/12/2022   Lab Results  Component Value Date   VD25OH 47.9 10/02/2023   VD25OH 55.1 05/22/2023   VD25OH 76.3 12/11/2022   Lab Results  Component Value Date   WBC 3.7 06/12/2022   HGB 13.3 06/12/2022   HCT 40.2 06/12/2022   MCV 92 06/12/2022   PLT 211 06/12/2022   No results found for: IRON,  TIBC, FERRITIN  Attestation Statements:   Reviewed by clinician on day of visit: allergies, medications, problem list, medical history, surgical history, family history, social history, and previous encounter notes.  I have reviewed the above documentation for accuracy and completeness, and I agree with the above. -  Alicia Freda d. Beckhem Isadore, NP-C

## 2024-01-02 ENCOUNTER — Other Ambulatory Visit: Payer: Self-pay | Admitting: Family Medicine

## 2024-01-02 DIAGNOSIS — E118 Type 2 diabetes mellitus with unspecified complications: Secondary | ICD-10-CM

## 2024-01-02 MED ORDER — TRULICITY 3 MG/0.5ML ~~LOC~~ SOAJ: 3.0000 mg | SUBCUTANEOUS | 1 refills | Status: DC

## 2024-01-03 ENCOUNTER — Other Ambulatory Visit: Payer: Self-pay | Admitting: Family Medicine

## 2024-01-03 ENCOUNTER — Other Ambulatory Visit: Payer: Self-pay | Admitting: Medical

## 2024-01-03 DIAGNOSIS — E118 Type 2 diabetes mellitus with unspecified complications: Secondary | ICD-10-CM

## 2024-01-06 ENCOUNTER — Other Ambulatory Visit (HOSPITAL_COMMUNITY): Payer: Self-pay

## 2024-01-06 ENCOUNTER — Telehealth: Payer: Self-pay

## 2024-01-06 NOTE — Telephone Encounter (Signed)
 No p/a is needed. I have called and spoke with the pts plan after receiving a N/A response when submitting the P/A. The Rx is covered and was last filled on on 8.22 and is not fill able again until 9.13. The Pts plan can only fill 60month at a time and the Pts Pharmacy was trying to run a 9month supply. If there are any further questions the pt may contact their Insurance at [phone].

## 2024-01-06 NOTE — Telephone Encounter (Signed)
 Request:   Determination: P/A not needed and can not be processed via Latent. Instead The Pts Insurance was contacted.

## 2024-01-08 ENCOUNTER — Other Ambulatory Visit: Payer: Self-pay | Admitting: Family Medicine

## 2024-01-08 DIAGNOSIS — E1159 Type 2 diabetes mellitus with other circulatory complications: Secondary | ICD-10-CM

## 2024-01-17 ENCOUNTER — Ambulatory Visit (INDEPENDENT_AMBULATORY_CARE_PROVIDER_SITE_OTHER): Admitting: Family Medicine

## 2024-01-17 ENCOUNTER — Encounter: Payer: Self-pay | Admitting: Family Medicine

## 2024-01-17 ENCOUNTER — Other Ambulatory Visit: Payer: Self-pay | Admitting: Family Medicine

## 2024-01-17 VITALS — BP 128/80 | HR 73 | Wt 245.0 lb

## 2024-01-17 DIAGNOSIS — Z23 Encounter for immunization: Secondary | ICD-10-CM

## 2024-01-17 DIAGNOSIS — E118 Type 2 diabetes mellitus with unspecified complications: Secondary | ICD-10-CM

## 2024-01-17 DIAGNOSIS — Z6841 Body Mass Index (BMI) 40.0 and over, adult: Secondary | ICD-10-CM

## 2024-01-17 LAB — POCT GLYCOSYLATED HEMOGLOBIN (HGB A1C): Hemoglobin A1C: 6.5 % — AB (ref 4.0–5.6)

## 2024-01-17 MED ORDER — TRULICITY 4.5 MG/0.5ML ~~LOC~~ SOAJ: 4.5000 mg | SUBCUTANEOUS | 3 refills | Status: DC

## 2024-01-17 NOTE — Progress Notes (Signed)
   Name: Alicia Lowery   Date of Visit: 01/17/24   Date of last visit with me: Visit date not found   CHIEF COMPLAINT:  Chief Complaint  Patient presents with   Diabetes    Diabetes.        HPI:  Discussed the use of AI scribe software for clinical note transcription with the patient, who gave verbal consent to proceed.  History of Present Illness   Alicia Lowery is a 63 year old female with type 2 diabetes who presents for follow-up regarding her diabetes management.  She has been using Trulicity  and finds it effective, although she has gained five pounds since her last visit a month ago. She attributes this weight gain to recent holidays and reduced physical activity. She has new, wider shoes that are more comfortable for walking.  She feels less hungry and is trying to follow cues of not eating when not hungry. Her last recorded A1c was 6.3 three months ago, which was considered prediabetic.  She recalls that her father would get very hungry sometimes, but she is unsure if he had diabetes. She does not report any other family history of diabetes.  She discusses her past eating habits, noting that she used to not eat much when she was younger, especially when engaged in activities like reading. She is now trying to adjust her eating habits based on her current medication's effects on her appetite.         OBJECTIVE:       06/12/2022   11:24 AM  Depression screen PHQ 2/9  Decreased Interest 0  Down, Depressed, Hopeless 0  PHQ - 2 Score 0     BP Readings from Last 3 Encounters:  01/17/24 128/80  12/19/23 121/74  11/07/23 106/70    BP 128/80   Pulse 73   Wt 245 lb (111.1 kg)   LMP 08/02/2016   SpO2 98%   BMI 44.81 kg/m    Physical Exam          Physical Exam  ASSESSMENT/PLAN:   Assessment & Plan BMI 40.0-44.9, adult (HCC)  Type 2 diabetes mellitus with complications (HCC)  Flu vaccine need    Assessment and Plan    Type 2 diabetes mellitus A1c  increased from 6.3 to 6.5, indicating worsened glycemic control. Weight gain and decreased activity likely contributors. Current Trulicity  dose insufficient. - Increase Trulicity  dose, send new dosing to [facility] on [address]. - Advise eating only when hungry, reduce calorie intake. - Discussed Trulicity 's role in appetite suppression, importance of hunger cues. - Encourage hydration with water, low-calorie beverages. - Reassess A1c and weight in three months.  Overweight Overweight status exacerbating glycemic control issues in type 2 diabetes. Recent five-pound weight gain linked to decreased activity and dietary habits. - Encourage increased physical activity, such as walking, with new comfortable shoes. - Advise dietary modifications: eat only when hungry, reduce portion sizes. - Discussed weight impact on diabetes management and health.         Deng Kemler A. Vita MD [facility]

## 2024-01-23 ENCOUNTER — Encounter (INDEPENDENT_AMBULATORY_CARE_PROVIDER_SITE_OTHER): Payer: Self-pay | Admitting: Adult Health

## 2024-01-23 ENCOUNTER — Ambulatory Visit (INDEPENDENT_AMBULATORY_CARE_PROVIDER_SITE_OTHER): Admitting: Adult Health

## 2024-01-23 VITALS — BP 107/70 | HR 66 | Temp 98.4°F | Ht 62.0 in | Wt 237.0 lb

## 2024-01-23 DIAGNOSIS — M109 Gout, unspecified: Secondary | ICD-10-CM | POA: Diagnosis not present

## 2024-01-23 DIAGNOSIS — E559 Vitamin D deficiency, unspecified: Secondary | ICD-10-CM | POA: Diagnosis not present

## 2024-01-23 DIAGNOSIS — Z7985 Long-term (current) use of injectable non-insulin antidiabetic drugs: Secondary | ICD-10-CM | POA: Diagnosis not present

## 2024-01-23 DIAGNOSIS — E1159 Type 2 diabetes mellitus with other circulatory complications: Secondary | ICD-10-CM

## 2024-01-23 DIAGNOSIS — E118 Type 2 diabetes mellitus with unspecified complications: Secondary | ICD-10-CM | POA: Diagnosis not present

## 2024-01-23 DIAGNOSIS — I152 Hypertension secondary to endocrine disorders: Secondary | ICD-10-CM

## 2024-01-23 DIAGNOSIS — E66813 Obesity, class 3: Secondary | ICD-10-CM | POA: Diagnosis not present

## 2024-01-23 DIAGNOSIS — Z6841 Body Mass Index (BMI) 40.0 and over, adult: Secondary | ICD-10-CM | POA: Diagnosis not present

## 2024-01-23 NOTE — Progress Notes (Signed)
 WEIGHT SUMMARY AND BIOMETRICS  Vitals Temp: 98.4 F (36.9 C) BP: 107/70 Pulse Rate: 66 SpO2: 98 %   Anthropometric Measurements Height: 5' 2 (1.575 m) Weight: 237 lb (107.5 kg) BMI (Calculated): 43.34 Weight at Last Visit: 240 lb Weight Lost Since Last Visit: 3 lb Weight Gained Since Last Visit: 0 Starting Weight: 246 lb Total Weight Loss (lbs): 9 lb (4.082 kg) Peak Weight: 246 lb   Body Composition  Body Fat %: 51.8 % Fat Mass (lbs): 122.8 lbs Muscle Mass (lbs): 108.6 lbs Visceral Fat Rating : 18   Other Clinical Data Fasting: yes Labs: no Today's Visit #: 60 Starting Date: 06/24/19    Chief Complaint:   OBESITY Alicia Lowery is here to discuss her progress with her obesity treatment plan.  She is on the the Category 2 Plan and states she is following her eating plan approximately 85-90 % of the time.  She states she is exercising Walking 25-30 minutes 5 times per week.  Interim History:  She will work at [employer] this month, then formally retire!  Exercise-Walking 20-30 at least 5 times weekly  Hydration-She estimates to drink at least 90 oz water per day  PCP manages daily Metformin  500mg  BID with meals and weekly Trulicity  3mg  She has two more weeks at 3mg  strength, then will increase to 4.5mg  Trulicity   She established with her new PCP on 01/17/2024 Vita Morrow, MD   Subjective:   1. Type 2 diabetes mellitus with complications (HCC) PCP manages daily Metformin  500mg  BID with meals and weekly Trulicity  3mg  She has two more weeks at 3mg  strength, then will increase to 4.5mg  Trulicity  Denies mass in neck, dysphagia, dyspepsia, persistent hoarseness, abdominal pain, or N/V/C   2. Hypertension associated with diabetes (HCC) BP at goal at OV She denies sx's of hypotension She drinks at least 90 oz water per day PCP manages daily Zestoretic  20/12.5mg   3. Vitamin D  deficiency  Latest Reference Range & Units 10/02/23 10:34  Vitamin D , 25-Hydroxy 30.0 -  100.0 ng/mL 47.9   Sheis on bi-weekly Ergocalciferol - denies N/V/Muscle Weakness  4. Gout, unspecified cause, unspecified chronicity, unspecified site Ref Range & Units (hover) 06/12/2022 1 yr ago  Uric Acid 8.8 High   Comment:            Therapeutic target for gout patients: <6.0   She denies acute sx's at present She did experience diffuse L foot pain at end of August- self resolved She is not on Xanthine oxidase inhibitor (XOI)  Assessment/Plan:   1. Type 2 diabetes mellitus with complications (HCC) (Primary) Complete Trulicity  3mg , then increase to max dose 4.5mg  Continue healthy eating and regular exercise  2. Hypertension associated with diabetes (HCC) Continue healthy eating and regular exercise  3. Vitamin D  deficiency Refill  Vitamin D , Ergocalciferol , (DRISDOL ) 1.25 MG (50000 UNIT) CAPS capsule TAKE 1 CAPSULE EVERY 14 DAYS Dispense: 6 capsule, Refills: 0 ordered   4. Gout, unspecified cause, unspecified chronicity, unspecified site Avoid high purine foods Increase water intake  5. Class 3 severe obesity due to excess calories with serious comorbidity and body mass index (BMI) of 40.0 to 44.9 in adult [facility]), CURRENT BMI 43.4  [name] is currently in the action stage of change. As such, her goal is to continue with weight loss efforts. She has agreed to the Category 2 Plan.   Exercise goals: All adults should avoid inactivity. Some physical activity is better than none, and adults who participate in any amount of physical  activity gain some health benefits. Adults should also include muscle-strengthening activities that involve all major muscle groups on 2 or more days a week.  Behavioral modification strategies: increasing lean protein intake, decreasing simple carbohydrates, increasing vegetables, increasing water intake, no skipping meals, meal planning and cooking strategies, keeping healthy foods in the home, ways to avoid boredom eating, and planning for  success.  Alicia Lowery has agreed to follow-up with our clinic in 4 weeks. She was informed of the importance of frequent follow-up visits to maximize her success with intensive lifestyle modifications for her multiple health conditions.   Check Fasting Labs Fall 2025  Objective:   Blood pressure 107/70, pulse 66, temperature 98.4 F (36.9 C), height 5' 2 (1.575 m), weight 237 lb (107.5 kg), last menstrual period 08/02/2016, SpO2 98%. Body mass index is 43.35 kg/m.  General: Cooperative, alert, well developed, in no acute distress. HEENT: Conjunctivae and lids unremarkable. Cardiovascular: Regular rhythm.  Lungs: Normal work of breathing. Neurologic: No focal deficits.   Lab Results  Component Value Date   CREATININE 0.78 10/02/2023   BUN 13 10/02/2023   NA 143 10/02/2023   K 4.0 10/02/2023   CL 105 10/02/2023   CO2 23 10/02/2023   Lab Results  Component Value Date   ALT 25 10/02/2023   AST 21 10/02/2023   ALKPHOS 68 10/02/2023   BILITOT 0.7 10/02/2023   Lab Results  Component Value Date   HGBA1C 6.5 (A) 01/17/2024   HGBA1C 6.3 (H) 10/02/2023   HGBA1C 6.9 (H) 05/22/2023   HGBA1C 6.6 (A) 01/15/2023   HGBA1C 6.5 (H) 12/11/2022   Lab Results  Component Value Date   INSULIN  22.0 10/02/2023   INSULIN  16.0 05/22/2023   INSULIN  17.3 12/11/2022   INSULIN  12.5 03/13/2022   INSULIN  13.8 05/02/2021   Lab Results  Component Value Date   TSH 1.280 06/24/2019   Lab Results  Component Value Date   CHOL 152 06/12/2022   HDL 45 06/12/2022   LDLCALC 86 06/12/2022   TRIG 116 06/12/2022   CHOLHDL 3.4 06/12/2022   Lab Results  Component Value Date   VD25OH 47.9 10/02/2023   VD25OH 55.1 05/22/2023   VD25OH 76.3 12/11/2022   Lab Results  Component Value Date   WBC 3.7 06/12/2022   HGB 13.3 06/12/2022   HCT 40.2 06/12/2022   MCV 92 06/12/2022   PLT 211 06/12/2022   No results found for: IRON, TIBC, FERRITIN  Attestation Statements:   Reviewed by clinician on  day of visit: allergies, medications, problem list, medical history, surgical history, family history, social history, and previous encounter notes.  I have reviewed the above documentation for accuracy and completeness, and I agree with the above. -  Rigley Niess d. Sherrine Salberg, NP-C

## 2024-02-03 ENCOUNTER — Telehealth: Payer: Self-pay

## 2024-02-03 NOTE — Telephone Encounter (Signed)
 Copied from CRM [id number]. Topic: Clinical - Medication Question >> Feb 03, 2024  9:41 AM Roselie BROCKS wrote: Reason for CRM: Covid shot Brand  name -- prefer [facility] Pharm[facility]- which ever pharmacy does the physer  or   [facility] [address], [address] [place] [address] Phone: [phone] Fax: [phone]    Called Pt to let her know pharmacy no longer requires RX with qualifying illness.

## 2024-02-17 ENCOUNTER — Ambulatory Visit: Payer: Self-pay

## 2024-02-17 NOTE — Telephone Encounter (Signed)
 Unable to schedule at this time due to being out of town. Will call back when she gets in town per notes

## 2024-02-17 NOTE — Telephone Encounter (Signed)
 FYI Only or Action Required?: Action required by provider: update on patient condition.  Patient was last seen in primary care on 01/23/2024 by Jonel Rockie BIRCH, NP.  Called Nurse Triage reporting Dysphagia.  Symptoms began several months ago.  Interventions attempted: Nothing.  Symptoms are: unchanged. Has a couple episodes where she felt like she couldn't swallow. No difficulty while eating. Currently out of town and will call back when she is in town.  Triage Disposition: See PCP Within 2 Weeks  Patient/caregiver understands and will follow disposition?: No, refuses disposition     Copied from CRM [id number]. Topic: Clinical - Red Word Triage >> Feb 17, 2024  1:15 PM Darshell M wrote: Red Word that prompted transfer to Nurse Triage Patient woke this morning feeling like nauseous near vomiting. Did not vomit. Patient was also swallow. As the day went on patient feels better. Reason for Disposition  Swallowing difficulty is a chronic symptom (recurrent or ongoing AND present > 4 weeks)  Answer Assessment - Initial Assessment Questions 1. DESCRIPTION: Tell me more about this problem. Are you  having trouble swallowing liquids, solids, or both? Any trouble with swallowing saliva (spit)?     Difficulty swallowing 2. SEVERITY: How bad is the swallowing difficulty?  (Scale 1-10; or mild, moderate, severe)      X 2 episodes 3. ONSET: When did the swallowing problems begin?      This morning 4. CAUSE: What do you think is causing the problem?  (e.g., dry mouth, food or pill stuck in throat, mouth pain, sore throat, progression of disease process such as dementia or Parkinson's disease).      unsure 5. CHRONIC or RECURRENT: Is this a new problem for you?  If No, ask: How long have you had this problem? (e.g., days, weeks, months)      6 months 6. OTHER SYMPTOMS: Do you have any other symptoms? (e.g., chest pain, difficulty breathing, mouth sores, sore throat, swollen tongue,  chest pain)     Had some nausea 7. PREGNANCY: Is there any chance you are pregnant? When was your last menstrual period?     no  Protocols used: Swallowing Difficulty-A-AH

## 2024-03-05 ENCOUNTER — Ambulatory Visit (INDEPENDENT_AMBULATORY_CARE_PROVIDER_SITE_OTHER): Admitting: Family Medicine

## 2024-03-05 ENCOUNTER — Encounter: Payer: Self-pay | Admitting: Family Medicine

## 2024-03-05 ENCOUNTER — Ambulatory Visit: Payer: Self-pay

## 2024-03-05 VITALS — BP 130/70 | HR 72 | Ht 62.0 in | Wt 236.0 lb

## 2024-03-05 DIAGNOSIS — E118 Type 2 diabetes mellitus with unspecified complications: Secondary | ICD-10-CM | POA: Diagnosis not present

## 2024-03-05 DIAGNOSIS — I152 Hypertension secondary to endocrine disorders: Secondary | ICD-10-CM | POA: Diagnosis not present

## 2024-03-05 DIAGNOSIS — E1159 Type 2 diabetes mellitus with other circulatory complications: Secondary | ICD-10-CM

## 2024-03-05 DIAGNOSIS — M79672 Pain in left foot: Secondary | ICD-10-CM | POA: Diagnosis not present

## 2024-03-05 MED ORDER — MELOXICAM 15 MG PO TABS: ORAL_TABLET | ORAL | 0 refills | Status: DC

## 2024-03-05 NOTE — Telephone Encounter (Signed)
 FYI Only or Action Required?: FYI only for provider.  Patient was last seen in primary care on 01/23/2024 by Jonel Rockie BIRCH, NP.  Called Nurse Triage reporting Foot Swelling.  Symptoms began yesterday.  Interventions attempted: OTC medications: Ibuprofen, Rest, hydration, or home remedies, and Ice/heat application.  Symptoms are: unchanged.  Triage Disposition: See HCP Within 4 Hours (Or PCP Triage)  Patient/caregiver understands and will follow disposition?: Yes Reason for Disposition  [1] SEVERE pain (e.g., excruciating, unable to do any normal activities) AND [2] not improved after 2 hours of pain medicine  Answer Assessment - Initial Assessment Questions Previously had gout, can't remember which foot. Denies pain radiating up the leg. Took a few Ibuprofen, no relief. Epsom salt soak, cool compress.  1. ONSET: When did the pain start?      Yesterday  2. LOCATION: Where is the pain located?      Left ankle and foot  3. PAIN: How bad is the pain?    (Scale 1-10; or mild, moderate, severe)     6/10 when sitting, 10/10 when trying to walk  4. WORK OR EXERCISE: Has there been any recent work or exercise that involved this part of the body?      Does not think so, does not recall. Did a little more walking than usual yesterday  5. CAUSE: What do you think is causing the foot pain?     Unsure  6. OTHER SYMPTOMS: Do you have any other symptoms? (e.g., leg pain, rash, fever, numbness)     A bit swollen  Protocols used: Foot Pain-A-AH  Copied from CRM [id number]. Topic: Clinical - Red Word Triage >> Mar 05, 2024 12:40 PM Fonda T wrote: [facility] that prompted transfer to Nurse Triage: Patient calling states she has severe pain in left ankle and foot, with increased swelling, and some difficulty walking.   Patient reports she traveled on yesterday, and started yesterday, got home took 2 Ibuprofen, has not helped. Painful when trying to put pressure on it.   Requesting  an appointment as soon as possible for evaluation.

## 2024-03-05 NOTE — Patient Instructions (Signed)
 Take the meloxicam  once daily (with dinner this evening). Always take it with food. Do not take any ibuprofen/advil/motrin, or aleve/naproxen or Goody/BC powder while taking the meloxicam . You MAY use tylenol  products as needed. You may use topical medications such as Biofreeze or SalonPas patches lidocaine (vs other topical lidocaine). Consider icing and elevation. After 48 hours you can switch to see if heat is more effective. Do what works best (sometimes  alternating ice and heat feels the best). You likely should try some compression--ace bandage or a neoprene sleeve. Follow-up with Dr. Vita in the next week or two if your pain isn't resolving. We did discuss the potential for a stress fracture and needing x-rays if not improving.

## 2024-03-05 NOTE — Progress Notes (Signed)
 Chief Complaint  Patient presents with   Ankle Pain    Left ankle and foot pain since yesterday. Worse when she puts her foot down to walk. Started to get worse on 5-6 hr train ride yesterday.    She first noted left foot and ankle pain around noon yesterday.  She had been walking a lot Monday and Tuesday--up and down steps at her sister's in [place], at the mall.  Nothing too out of the ordinary.  She was wearing her regular tennis shoes. Had worn different shoes on Sunday. No known injury or trauma, no twisting, fall.  Pain is across the top of the foot, the medial aspect of the ankle, and also extends posteriorly. She has throbbing pain. She took ibuprofen 400 mg which helped some.  Elevating doesn't help. Worse with walking, sitting. It hurt a lot last night in bed. Warm soaking with epsom salt--didn't help. Iced towel didn't help.   PMH, PSH, SH reviewed  DM, HTN, HLD, low Vit D, h/o gout (in toe)  Lab Results  Component Value Date   HGBA1C 6.5 (A) 01/17/2024   Lab Results  Component Value Date   CREATININE 0.78 10/02/2023     Outpatient Encounter Medications as of 03/05/2024  Medication Sig Note   ACCU-CHEK FASTCLIX LANCETS MISC Use to check blood sugar 3 times daily    blood glucose meter kit and supplies KIT Dispense One Touch Ultra or Verio. Test twice a day.  E11.9.    Dulaglutide  (TRULICITY ) 4.5 MG/0.5ML SOAJ INJECT 4.5 MG AS DIRECTED ONCE A WEEK.    furosemide  (LASIX ) 20 MG tablet TAKE 1 TABLET BY MOUTH EVERY 3 DAYS    glucose blood (ACCU-CHEK GUIDE) test strip 1 each by Other route in the morning, at noon, and at bedtime. Use as instructed to check blood sugar readings three time a day.    ibuprofen (ADVIL) 200 MG tablet Take 400 mg by mouth every 6 (six) hours as needed. 03/05/2024: Took 400mg  at 10am.    lisinopril -hydrochlorothiazide  (ZESTORETIC ) 20-12.5 MG tablet TAKE 1 TABLET BY MOUTH EVERY DAY    metFORMIN  (GLUCOPHAGE ) 500 MG tablet TAKE 1 TABLET BY MOUTH TWICE  A DAY WITH MEALS    rosuvastatin  (CRESTOR ) 20 MG tablet Take 1 tablet (20 mg total) by mouth daily.    Vitamin D , Ergocalciferol , (DRISDOL ) 1.25 MG (50000 UNIT) CAPS capsule TAKE 1 CAPSULE  EVERY 14 DAYS    No facility-administered encounter medications on file as of 03/05/2024.   Allergies  Allergen Reactions   Grapeseed Extract [Nutritional Supplements] Anaphylaxis    Grapes> hives, swells lips and throat     ROS: No f/c, URI symptoms, HA, dizziness, syncope. No CP, SOB. Earlier this month had some reflux/indigestion. No further nausea, no vomiting. Bowels are normal. No swelling in feet No rashes   PHYSICAL EXAM:  BP 130/70   Pulse 72   Ht 5' 2 (1.575 m)   Wt 236 lb (107 kg)   LMP 08/02/2016   BMI 43.16 kg/m   Wt Readings from Last 3 Encounters:  03/05/24 236 lb (107 kg)  01/23/24 237 lb (107.5 kg)  01/17/24 245 lb (111.1 kg)   Pleasant, well-appearing female.  She is a little anxious, hesitant to move L foot/ankle due to pain. LLE: Foot ankle appears puffy/thick, but symmetric to her right ankle and foot.  There is no pitting edema Normal pulses bilaterally. No erythema or warmth over foot or ankle. Mildly tender at L lateral malleolus, but no  tenderness elsewhere at the ankle, nontender at the ligaments. Tender at the mid 3rd metatarsal.  Nontender elsewhere at the top of the foot. Tender at medial malleolus, slightly tender just superior and anteriorly, minimal inferiorly. Mildly tender just medial to the acchilles tendon.  Nontender over the tendon. No calf tenderness  Pain across entire front of ankle with plantarflexion and dorsiflexion, and with resistance. Normal strength. No pain with inversion, +pain with eversion. Normal strength.   ASSESSMENT/PLAN:  Left foot pain - pain at foot and ankle.  No e/o gout, trauma. Suspect tendonitis. Cannot r/o 3rd MT stress fx but doubt. Course of NSAIDs, (risks/SE reviewed); f/u Dr. Vita prn - Plan: meloxicam  (MOBIC )  15 MG tablet  Type 2 diabetes mellitus with complications (HCC) - DM is well controlled.  Making risk for charcot foot less likely (pain is also very acute in onset).  Hypertension associated with diabetes (HCC) - BP is well controlled  Patient with well controlled DM, and normal kidney function.  She tolerates ibuprofen, and has no contraindications to NSAID use. Risks/SE reviewed, and counseled re: avoiding other OTC NSAIDs. She has h/o gout--exam and history is NOT c/w gout.   Take the meloxicam  once daily (with dinner this evening). Always take it with food. Do not take any ibuprofen/advil/motrin, or aleve/naproxen or Goody/BC powder while taking the meloxicam . You MAY use tylenol  products as needed. You may use topical medications such as Biofreeze or SalonPas patches lidocaine (vs other topical lidocaine). Consider icing and elevation. After 48 hours you can switch to see if heat is more effective. Do what works best (sometimes  alternating ice and heat feels the best). You likely should try some compression--ace bandage or a neoprene sleeve. Follow-up with Dr. Vita in the next week or two if your pain isn't resolving. We did discuss the potential for a stress fracture and needing x-rays if not improving.

## 2024-03-09 ENCOUNTER — Ambulatory Visit (INDEPENDENT_AMBULATORY_CARE_PROVIDER_SITE_OTHER): Admitting: Adult Health

## 2024-03-09 ENCOUNTER — Encounter (INDEPENDENT_AMBULATORY_CARE_PROVIDER_SITE_OTHER): Payer: Self-pay | Admitting: Adult Health

## 2024-03-09 VITALS — BP 138/79 | HR 65 | Temp 97.5°F | Ht 62.0 in | Wt 237.0 lb

## 2024-03-09 DIAGNOSIS — M79672 Pain in left foot: Secondary | ICD-10-CM | POA: Diagnosis not present

## 2024-03-09 DIAGNOSIS — E1169 Type 2 diabetes mellitus with other specified complication: Secondary | ICD-10-CM | POA: Diagnosis not present

## 2024-03-09 DIAGNOSIS — E785 Hyperlipidemia, unspecified: Secondary | ICD-10-CM | POA: Diagnosis not present

## 2024-03-09 DIAGNOSIS — E669 Obesity, unspecified: Secondary | ICD-10-CM | POA: Diagnosis not present

## 2024-03-09 DIAGNOSIS — Z7985 Long-term (current) use of injectable non-insulin antidiabetic drugs: Secondary | ICD-10-CM

## 2024-03-09 DIAGNOSIS — Z6841 Body Mass Index (BMI) 40.0 and over, adult: Secondary | ICD-10-CM | POA: Diagnosis not present

## 2024-03-09 DIAGNOSIS — E559 Vitamin D deficiency, unspecified: Secondary | ICD-10-CM

## 2024-03-09 DIAGNOSIS — E1159 Type 2 diabetes mellitus with other circulatory complications: Secondary | ICD-10-CM

## 2024-03-09 DIAGNOSIS — I152 Hypertension secondary to endocrine disorders: Secondary | ICD-10-CM | POA: Diagnosis not present

## 2024-03-09 DIAGNOSIS — E118 Type 2 diabetes mellitus with unspecified complications: Secondary | ICD-10-CM

## 2024-03-09 MED ORDER — VITAMIN D (ERGOCALCIFEROL) 1.25 MG (50000 UNIT) PO CAPS
ORAL_CAPSULE | ORAL | 0 refills | Status: AC
Start: 2024-03-09 — End: ?

## 2024-03-09 NOTE — Progress Notes (Signed)
 WEIGHT SUMMARY AND BIOMETRICS  Vitals Temp: (!) 97.5 F (36.4 C) BP: 138/79 Pulse Rate: 65 SpO2: 98 %   Anthropometric Measurements Height: 5' 2 (1.575 m) Weight: 237 lb (107.5 kg) BMI (Calculated): 43.34 Weight at Last Visit: 237 lb Weight Lost Since Last Visit: 0 Weight Gained Since Last Visit: 0 Starting Weight: 246 lb Total Weight Loss (lbs): 9 lb (4.082 kg) Peak Weight: 246 lb   Body Composition  Body Fat %: 51.1 % Fat Mass (lbs): 121.4 lbs Muscle Mass (lbs): 110.4 lbs Total Body Water (lbs): 90 lbs Visceral Fat Rating : 18   Other Clinical Data Fasting: yes Labs: no Today's Visit #: 34 Starting Date: 06/24/19    Chief Complaint:   OBESITY Alicia Lowery is here to discuss her progress with her obesity treatment plan.  She is on the the Category 2 Plan and states she is following her eating plan approximately 65 % of the time.  She states she is exercising Walking 30 minutes 3 times per week.  Interim History:  She is formally retired from [employer]- will work at [employer] on PRN basis  Hunger/appetite-she has yet to start max dose Trulicity  4.5mg - she has remained on 3mg  She plans on starting on 4.5mg  either tomorrow or Wednesday Discussed the benefits of maximizing Trulicity  therapy   Hydration-She has increased hydration- now able to drink 32 ox water/day  Subjective:   1. Left foot pain PCP OV Notes 03/05/2024 Chief Complaint  Patient presents with   Ankle Pain      Left ankle and foot pain since yesterday. Worse when she puts her foot down to walk. Started to get worse on 5-6 hr train ride yesterday.     She first noted left foot and ankle pain around noon yesterday.  She had been walking a lot Monday and Tuesday--up and down steps at her sister's in [place], at the mall.  Nothing too out of the ordinary.  She was wearing her regular tennis shoes. Had worn different shoes on Sunday. No known injury or trauma, no twisting, fall.   Pain is across the top of  the foot, the medial aspect of the ankle, and also extends posteriorly. She has throbbing pain. She took ibuprofen 400 mg which helped some.   Elevating doesn't help. Worse with walking, sitting. It hurt a lot last night in bed. Warm soaking with epsom salt--didn't help. Iced towel didn't help.  2. Vitamin D  deficiency  Latest Reference Range & Units 12/11/22 11:07 05/22/23 08:39 10/02/23 10:34  Vitamin D , 25-Hydroxy 30.0 - 100.0 ng/mL 76.3 55.1 47.9   She is on bi-weekly Ergocalciferol - denies N/V/Muscle Weaknss  3. Type 2 diabetes mellitus with complications (HCC) Lab Results  Component Value Date   HGBA1C 6.5 (A) 01/17/2024   HGBA1C 6.3 (H) 10/02/2023   HGBA1C 6.9 (H) 05/22/2023    Hunger/appetite-she has yet to start max dose Trulicity  4.5mg - she has remained on 3mg  She plans on starting on 4.5mg  either tomorrow or Wednesday Discussed the benefits of maximizing Trulicity  therapy  She has continued Metformin  500mg  BID with meals Denies mass in neck, dysphagia, dyspepsia, persistent hoarseness, abdominal pain, or N/V/C   4. Hyperlipidemia associated with type 2 diabetes mellitus (HCC) She is on  metFORMIN  (GLUCOPHAGE ) 500 MG tablet  furosemide  (LASIX ) 20 MG tablet  rosuvastatin  (CRESTOR ) 20 MG tablet  lisinopril -hydrochlorothiazide  (ZESTORETIC ) 20-12.5 MG tablet  Dulaglutide  (TRULICITY ) 4.5 MG/0.5ML SOAJ   5. Hypertension associated with diabetes (HCC) PCP Manages: metFORMIN  (GLUCOPHAGE ) 500  MG tablet  furosemide  (LASIX ) 20 MG tablet  rosuvastatin  (CRESTOR ) 20 MG tablet  lisinopril -hydrochlorothiazide  (ZESTORETIC ) 20-12.5 MG tablet  Dulaglutide  (TRULICITY ) 4.5 MG/0.5ML SOAJ   Assessment/Plan:   1. Left foot pain F/u with PCP PRN and as directed  2. Vitamin D  deficiency Check Labs - VITAMIN D  25 Hydroxy (Vit-D Deficiency, Fractures) Refill - Vitamin D , Ergocalciferol , (DRISDOL ) 1.25 MG (50000 UNIT) CAPS capsule; TAKE 1 CAPSULE  EVERY 14 DAYS  Dispense: 6 capsule;  Refill: 0  3. Type 2 diabetes mellitus with complications (HCC) (Primary) Check Labs - Hemoglobin A1c - Vitamin B12 - Insulin , random INCREASE TRULICITY  THIS WEEK to MAX DOSE 4.5mg   4. Hyperlipidemia associated with type 2 diabetes mellitus (HCC) Check Labs - Lipid panel  5. Hypertension associated with diabetes (HCC) Check Labs - Comprehensive metabolic panel with GFR  6. BMI 40.0-44.9, adult (HCC), CURRENT BMI 43.5 INCREASE TRULICITY  THIS WEEK to MAX DOSE 4.5mg   Alicia Lowery is currently in the action stage of change. As such, her goal is to continue with weight loss efforts. She has agreed to the Category 2 Plan.   Exercise goals: All adults should avoid inactivity. Some physical activity is better than none, and adults who participate in any amount of physical activity gain some health benefits. Adults should also include muscle-strengthening activities that involve all major muscle groups on 2 or more days a week. Walk at least 3 X Week  Behavioral modification strategies: increasing lean protein intake, decreasing simple carbohydrates, increasing vegetables, increasing water intake, no skipping meals, meal planning and cooking strategies, keeping healthy foods in the home, ways to avoid boredom eating, and planning for success.  Alicia Lowery has agreed to follow-up with our clinic in 4 weeks. She was informed of the importance of frequent follow-up visits to maximize her success with intensive lifestyle modifications for her multiple health conditions.   Alicia Lowery was informed we would discuss her lab results at her next visit unless there is a critical issue that needs to be addressed sooner. Alicia Lowery agreed to keep her next visit at the agreed upon time to discuss these results.  Objective:   Blood pressure 138/79, pulse 65, temperature (!) 97.5 F (36.4 C), height 5' 2 (1.575 m), weight 237 lb (107.5 kg), last menstrual period 08/02/2016, SpO2 98%. Body mass index is 43.35  kg/m.  General: Cooperative, alert, well developed, in no acute distress. HEENT: Conjunctivae and lids unremarkable. Cardiovascular: Regular rhythm.  Lungs: Normal work of breathing. Neurologic: No focal deficits.   Lab Results  Component Value Date   CREATININE 0.78 10/02/2023   BUN 13 10/02/2023   NA 143 10/02/2023   K 4.0 10/02/2023   CL 105 10/02/2023   CO2 23 10/02/2023   Lab Results  Component Value Date   ALT 25 10/02/2023   AST 21 10/02/2023   ALKPHOS 68 10/02/2023   BILITOT 0.7 10/02/2023   Lab Results  Component Value Date   HGBA1C 6.5 (A) 01/17/2024   HGBA1C 6.3 (H) 10/02/2023   HGBA1C 6.9 (H) 05/22/2023   HGBA1C 6.6 (A) 01/15/2023   HGBA1C 6.5 (H) 12/11/2022   Lab Results  Component Value Date   INSULIN  22.0 10/02/2023   INSULIN  16.0 05/22/2023   INSULIN  17.3 12/11/2022   INSULIN  12.5 03/13/2022   INSULIN  13.8 05/02/2021   Lab Results  Component Value Date   TSH 1.280 06/24/2019   Lab Results  Component Value Date   CHOL 152 06/12/2022   HDL 45 06/12/2022   LDLCALC 86 06/12/2022  TRIG 116 06/12/2022   CHOLHDL 3.4 06/12/2022   Lab Results  Component Value Date   VD25OH 47.9 10/02/2023   VD25OH 55.1 05/22/2023   VD25OH 76.3 12/11/2022   Lab Results  Component Value Date   WBC 3.7 06/12/2022   HGB 13.3 06/12/2022   HCT 40.2 06/12/2022   MCV 92 06/12/2022   PLT 211 06/12/2022   No results found for: IRON, TIBC, FERRITIN  Attestation Statements:   Reviewed by clinician on day of visit: allergies, medications, problem list, medical history, surgical history, family history, social history, and previous encounter notes.  I have reviewed the above documentation for accuracy and completeness, and I agree with the above. -  Porter Moes d. Mousa Prout, NP-C

## 2024-03-10 LAB — COMPREHENSIVE METABOLIC PANEL WITH GFR
ALT: 20 IU/L (ref 0–32)
AST: 16 IU/L (ref 0–40)
Albumin: 4 g/dL (ref 3.9–4.9)
Alkaline Phosphatase: 72 IU/L (ref 49–135)
BUN/Creatinine Ratio: 14 (ref 12–28)
BUN: 13 mg/dL (ref 8–27)
Bilirubin Total: 0.7 mg/dL (ref 0.0–1.2)
CO2: 24 mmol/L (ref 20–29)
Calcium: 9.9 mg/dL (ref 8.7–10.3)
Chloride: 103 mmol/L (ref 96–106)
Creatinine, Ser: 0.9 mg/dL (ref 0.57–1.00)
Globulin, Total: 2.8 g/dL (ref 1.5–4.5)
Glucose: 127 mg/dL — ABNORMAL HIGH (ref 70–99)
Potassium: 4.4 mmol/L (ref 3.5–5.2)
Sodium: 142 mmol/L (ref 134–144)
Total Protein: 6.8 g/dL (ref 6.0–8.5)
eGFR: 71 mL/min/1.73 (ref >59)

## 2024-03-10 LAB — LIPID PANEL
Chol/HDL Ratio: 5 ratio — ABNORMAL HIGH (ref 0.0–4.4)
Cholesterol, Total: 205 mg/dL — ABNORMAL HIGH (ref 100–199)
HDL: 41 mg/dL (ref >39)
LDL Chol Calc (NIH): 132 mg/dL — ABNORMAL HIGH (ref 0–99)
Triglycerides: 181 mg/dL — ABNORMAL HIGH (ref 0–149)
VLDL Cholesterol Cal: 32 mg/dL (ref 5–40)

## 2024-03-10 LAB — VITAMIN B12: Vitamin B-12: 954 pg/mL (ref 232–1245)

## 2024-03-10 LAB — VITAMIN D 25 HYDROXY (VIT D DEFICIENCY, FRACTURES): Vit D, 25-Hydroxy: 44.9 ng/mL (ref 30.0–100.0)

## 2024-03-10 LAB — HEMOGLOBIN A1C
Est. average glucose Bld gHb Est-mCnc: 143 mg/dL
Hgb A1c MFr Bld: 6.6 % — ABNORMAL HIGH (ref 4.8–5.6)

## 2024-03-10 LAB — INSULIN, RANDOM: INSULIN: 16.9 u[IU]/mL (ref 2.6–24.9)

## 2024-04-20 ENCOUNTER — Ambulatory Visit: Payer: Self-pay | Admitting: Family Medicine

## 2024-04-23 ENCOUNTER — Ambulatory Visit (INDEPENDENT_AMBULATORY_CARE_PROVIDER_SITE_OTHER): Payer: Self-pay | Admitting: Adult Health

## 2024-04-23 VITALS — BP 126/73 | HR 68 | Temp 97.4°F | Ht 62.0 in | Wt 237.0 lb

## 2024-04-23 DIAGNOSIS — Z6841 Body Mass Index (BMI) 40.0 and over, adult: Secondary | ICD-10-CM

## 2024-04-23 DIAGNOSIS — E1159 Type 2 diabetes mellitus with other circulatory complications: Secondary | ICD-10-CM | POA: Diagnosis not present

## 2024-04-23 DIAGNOSIS — Z7984 Long term (current) use of oral hypoglycemic drugs: Secondary | ICD-10-CM | POA: Diagnosis not present

## 2024-04-23 DIAGNOSIS — E669 Obesity, unspecified: Secondary | ICD-10-CM

## 2024-04-23 DIAGNOSIS — M79672 Pain in left foot: Secondary | ICD-10-CM | POA: Diagnosis not present

## 2024-04-23 DIAGNOSIS — E118 Type 2 diabetes mellitus with unspecified complications: Secondary | ICD-10-CM

## 2024-04-23 DIAGNOSIS — I152 Hypertension secondary to endocrine disorders: Secondary | ICD-10-CM | POA: Diagnosis not present

## 2024-04-23 DIAGNOSIS — E1169 Type 2 diabetes mellitus with other specified complication: Secondary | ICD-10-CM | POA: Diagnosis not present

## 2024-04-23 DIAGNOSIS — E559 Vitamin D deficiency, unspecified: Secondary | ICD-10-CM

## 2024-04-23 DIAGNOSIS — E785 Hyperlipidemia, unspecified: Secondary | ICD-10-CM

## 2024-04-23 MED ORDER — VITAMIN D (ERGOCALCIFEROL) 1.25 MG (50000 UNIT) PO CAPS: ORAL_CAPSULE | ORAL | 0 refills | Status: AC

## 2024-04-23 NOTE — Progress Notes (Signed)
 WEIGHT SUMMARY AND BIOMETRICS  Vitals Temp: (!) 97.4 F (36.3 C) BP: 126/73 Pulse Rate: 68 SpO2: 98 %   Anthropometric Measurements Height: 5' 2 (1.575 m) Weight: 237 lb (107.5 kg) BMI (Calculated): 43.34 Weight at Last Visit: 237 lb Weight Lost Since Last Visit: 0 Weight Gained Since Last Visit: 0 Starting Weight: 246 lb Total Weight Loss (lbs): 9 lb (4.082 kg) Peak Weight: 246 lb   Body Composition  Body Fat %: 51 % Fat Mass (lbs): 121.2 lbs Muscle Mass (lbs): 110.8 lbs Total Body Water (lbs): 89.8 lbs Visceral Fat Rating : 18   Other Clinical Data Fasting: yes Labs: no Today's Visit #: 50 Starting Date: 06/24/19    Chief Complaint:   OBESITY Alicia Lowery is here to discuss her progress with her obesity treatment plan.  She is on the the Category 2 Plan and states she is following her eating plan approximately 80 % of the time.  She states she is exercising Walking 30 minutes 4 times per week.  Interim History:  She recently increased weekly Trulicity  to max dose 4.5mg  She has had > month on this dose Her fasting CBG this morning 94  Denies mass in neck, dysphagia, dyspepsia, persistent hoarseness, abdominal pain, or N/V/C   Hunger/appetite-stable appetite  She has enjoyed PRN shifts at [employer]  Subjective:   1. Hyperlipidemia associated with type 2 diabetes mellitus (HCC) Discussed Labs Lipid Panel     Component Value Date/Time   CHOL 205 (H) 03/09/2024 1009   TRIG 181 (H) 03/09/2024 1009   HDL 41 03/09/2024 1009   CHOLHDL 5.0 (H) 03/09/2024 1009   CHOLHDL 5.3 (H) 04/16/2017 1434   VLDL 25 03/04/2015 0001   LDLCALC 132 (H) 03/09/2024 1009   LDLCALC 155 (H) 04/16/2017 1434   LABVLDL 32 03/09/2024 1009    Tot, TGs, LDL levels uncontrolled PCP manages statin therapy  2. Type 2 diabetes mellitus with complications (HCC) Discussed Labs  Latest Reference Range & Units 03/09/24 10:09  Glucose 70 - 99 mg/dL 872 (H)  Hemoglobin J8R 4.8  - 5.6 % 6.6 (H)  Est. average glucose Bld gHb Est-mCnc mg/dL 856  INSULIN  2.6 - 24.9 uIU/mL 16.9  (H): Data is abnormally high  Fasting CBG will range from mid 80s to low 140s CBG elevated A1c at goal Insulin  level improved, however still above goal of 5 She recently increased weekly Trulicity  to max dose 4.5mg  She has had > month on this dose Her fasting CBG this morning 94 She has remained on Metformin  500mg  BID with meals  3. Vitamin D  deficiency Discussed Labs  Latest Reference Range & Units 03/09/24 10:09  Vitamin D , 25-Hydroxy 30.0 - 100.0 ng/mL 44.9   She is on Ergocalciferol  Q14D Vit D Level below goal 50-70  4. Hypertension associated with diabetes (HCC) Discussed Labs 03/09/2024 RFE:Zozrumnobuzd, Liver Enzymes, and Kidney Fx- stable BP excellent at OV  5. Left foot pain Discussed Labs  Latest Reference Range & Units 03/09/24 10:09  Vitamin D , 25-Hydroxy 30.0 - 100.0 ng/mL 44.9   She is on Ergocalciferol  Q14D Vit D Level below goal 50-70  Assessment/Plan:   1. Hyperlipidemia associated with type 2 diabetes mellitus (HCC) F/u with PCP, re: statin mgt- LDL well above goal of 70  2. Type 2 diabetes mellitus with complications (HCC) Inquire if her insurance will cover either Ozempic  or Mounjaro therapy  3. Vitamin D  deficiency (Primary) Refill and INCREASE - Vitamin D , Ergocalciferol , (DRISDOL ) 1.25 MG (50000 UNIT)  CAPS capsule; TAKE 1 CAPSULE  EVERY 7 DAYS  Dispense: 6 capsule; Refill: 0  4. Hypertension associated with diabetes (HCC) Limit Na+ Continue regular cardiovascular exercise  5. Left foot pain Refill and INCREASE - Vitamin D , Ergocalciferol , (DRISDOL ) 1.25 MG (50000 UNIT) CAPS capsule; TAKE 1 CAPSULE  EVERY 7 DAYS  Dispense: 6 capsule; Refill: 0  6. BMI 40.0-44.9, adult (HCC), CURRENT BMI 43.5  Alicia Lowery is currently in the action stage of change. As such, her goal is to continue with weight loss efforts. She has agreed to the Category 2 Plan.    Exercise goals: For substantial health benefits, adults should do at least 150 minutes (2 hours and 30 minutes) a week of moderate-intensity, or 75 minutes (1 hour and 15 minutes) a week of vigorous-intensity aerobic physical activity, or an equivalent combination of moderate- and vigorous-intensity aerobic activity. Aerobic activity should be performed in episodes of at least 10 minutes, and preferably, it should be spread throughout the week.  Behavioral modification strategies: increasing lean protein intake, decreasing simple carbohydrates, increasing vegetables, increasing water intake, no skipping meals, meal planning and cooking strategies, keeping healthy foods in the home, travel eating strategies, holiday eating strategies , and planning for success.  Alicia Lowery has agreed to follow-up with our clinic in 4 weeks. She was informed of the importance of frequent follow-up visits to maximize her success with intensive lifestyle modifications for her multiple health conditions.   Objective:   Blood pressure 126/73, pulse 68, temperature (!) 97.4 F (36.3 C), height 5' 2 (1.575 m), weight 237 lb (107.5 kg), last menstrual period 08/02/2016, SpO2 98%. Body mass index is 43.35 kg/m.  General: Cooperative, alert, well developed, in no acute distress. HEENT: Conjunctivae and lids unremarkable. Cardiovascular: Regular rhythm.  Lungs: Normal work of breathing. Neurologic: No focal deficits.   Lab Results  Component Value Date   CREATININE 0.90 03/09/2024   BUN 13 03/09/2024   NA 142 03/09/2024   K 4.4 03/09/2024   CL 103 03/09/2024   CO2 24 03/09/2024   Lab Results  Component Value Date   ALT 20 03/09/2024   AST 16 03/09/2024   ALKPHOS 72 03/09/2024   BILITOT 0.7 03/09/2024   Lab Results  Component Value Date   HGBA1C 6.6 (H) 03/09/2024   HGBA1C 6.5 (A) 01/17/2024   HGBA1C 6.3 (H) 10/02/2023   HGBA1C 6.9 (H) 05/22/2023   HGBA1C 6.6 (A) 01/15/2023   Lab Results  Component  Value Date   INSULIN  16.9 03/09/2024   INSULIN  22.0 10/02/2023   INSULIN  16.0 05/22/2023   INSULIN  17.3 12/11/2022   INSULIN  12.5 03/13/2022   Lab Results  Component Value Date   TSH 1.280 06/24/2019   Lab Results  Component Value Date   CHOL 205 (H) 03/09/2024   HDL 41 03/09/2024   LDLCALC 132 (H) 03/09/2024   TRIG 181 (H) 03/09/2024   CHOLHDL 5.0 (H) 03/09/2024   Lab Results  Component Value Date   VD25OH 44.9 03/09/2024   VD25OH 47.9 10/02/2023   VD25OH 55.1 05/22/2023   Lab Results  Component Value Date   WBC 3.7 06/12/2022   HGB 13.3 06/12/2022   HCT 40.2 06/12/2022   MCV 92 06/12/2022   PLT 211 06/12/2022   No results found for: IRON, TIBC, FERRITIN  Attestation Statements:   Reviewed by clinician on day of visit: allergies, medications, problem list, medical history, surgical history, family history, social history, and previous encounter notes.  I have reviewed the above documentation for  accuracy and completeness, and I agree with the above. -  Rafik Koppel d. Kidus Delman, NP-C

## 2024-04-28 ENCOUNTER — Ambulatory Visit: Admitting: Family Medicine

## 2024-04-29 ENCOUNTER — Encounter: Payer: Self-pay | Admitting: Family Medicine

## 2024-04-29 ENCOUNTER — Ambulatory Visit (INDEPENDENT_AMBULATORY_CARE_PROVIDER_SITE_OTHER): Admitting: Family Medicine

## 2024-04-29 VITALS — BP 134/82 | HR 72 | Wt 244.2 lb

## 2024-04-29 DIAGNOSIS — M25522 Pain in left elbow: Secondary | ICD-10-CM

## 2024-04-29 DIAGNOSIS — E119 Type 2 diabetes mellitus without complications: Secondary | ICD-10-CM

## 2024-04-29 DIAGNOSIS — Z7985 Long-term (current) use of injectable non-insulin antidiabetic drugs: Secondary | ICD-10-CM

## 2024-04-29 DIAGNOSIS — E118 Type 2 diabetes mellitus with unspecified complications: Secondary | ICD-10-CM | POA: Diagnosis not present

## 2024-04-29 DIAGNOSIS — I152 Hypertension secondary to endocrine disorders: Secondary | ICD-10-CM

## 2024-04-29 DIAGNOSIS — E785 Hyperlipidemia, unspecified: Secondary | ICD-10-CM

## 2024-04-29 DIAGNOSIS — E1159 Type 2 diabetes mellitus with other circulatory complications: Secondary | ICD-10-CM | POA: Diagnosis not present

## 2024-04-29 DIAGNOSIS — E1169 Type 2 diabetes mellitus with other specified complication: Secondary | ICD-10-CM | POA: Diagnosis not present

## 2024-04-29 MED ORDER — MELOXICAM 15 MG PO TABS: 15.0000 mg | ORAL_TABLET | Freq: Every day | ORAL | 0 refills | Status: AC

## 2024-04-29 MED ORDER — ROSUVASTATIN CALCIUM 20 MG PO TABS: 20.0000 mg | ORAL_TABLET | Freq: Every day | ORAL | 1 refills | Status: AC

## 2024-04-29 MED ORDER — METFORMIN HCL 500 MG PO TABS: 500.0000 mg | ORAL_TABLET | Freq: Two times a day (BID) | ORAL | 0 refills | Status: AC

## 2024-04-29 NOTE — Patient Instructions (Signed)
 Please go get your xrays done at [facility]. You do not need to make an appointment. You can just show up.   Address: [address] [place], [place], [place] [address]

## 2024-04-29 NOTE — Progress Notes (Addendum)
° °  Name: Alicia Lowery   Date of Visit: 04/29/2024   Date of last visit with me: 01/17/2024   CHIEF COMPLAINT:  Chief Complaint  Patient presents with   Medication Management    Just had A1c, declines physical at this time. Elbow is acting up.        HPI:  Discussed the use of AI scribe software for clinical note transcription with the patient, who gave verbal consent to proceed.  History of Present Illness   Alicia Lowery is a 63 year old female with diabetes who presents for follow-up and evaluation of elbow pain.  She is here for a follow-up visit regarding her diabetes management. Her A1c levels have decreased slightly, which she attributes to her current medication regimen. She is on the maximum dose of Trulicity  at 4.5 mg, which she feels is helping to lower her A1c. Her current medications include Trulicity , Crestor , and metformin . She is also actively trying to increase her physical activity, partly due to her car breaking down, which necessitates more walking.  She experiences intermittent elbow pain, described as a 'snap' sensation, and is unsure if it is related to sleeping on it or a past injury. She recalls a possible hairline fracture in her elbow from her youth but is uncertain which arm was affected. The pain does not appear to be linked to any recent trauma. She has previously used meloxicam  for similar issues.  She plans to travel north to visit family, including a sister in [place] and a cousin in [place], [place] .         OBJECTIVE:       06/12/2022   11:24 AM  Depression screen PHQ 2/9  Decreased Interest 0  Down, Depressed, Hopeless 0  PHQ - 2 Score 0     BP Readings from Last 3 Encounters:  04/29/24 134/82  04/23/24 126/73  03/09/24 138/79    BP 134/82   Pulse 72   Wt 244 lb 3.2 oz (110.8 kg)   LMP 08/02/2016   SpO2 98%   BMI 44.66 kg/m    Physical Exam          Physical Exam  ASSESSMENT/PLAN:   Assessment & Plan Type 2 diabetes  mellitus with complications (HCC)  Hypertension associated with diabetes (HCC)  Diabetes mellitus treated with injections of non-insulin  medication (HCC)  Left elbow pain  Hyperlipidemia associated with type 2 diabetes mellitus (HCC)  Type 2 diabetes mellitus without complication, without long-term current use of insulin  (HCC)    Assessment and Plan    Type 2 diabetes mellitus A1c decreased, indicating improved glycemic control. Discussed potential to discontinue metformin  if A1c remains below 6.5 and weight management is maintained. - Continue Trulicity  4.5 mg. - Refilled metformin . - Monitor A1c levels. - Encouraged weight management and physical activity.  Left elbow pain Intermittent snapping sensation, possibly related to past injury with hairline fracture. Potential for accelerated arthritic changes. - Ordered x-ray of left elbow at [facility]. - Prescribed meloxicam  for flare-ups.  Hyperlipidemia Refill needed. - Refilled Crestor .         Maverick Dieudonne A. Vita MD [facility]

## 2024-05-13 ENCOUNTER — Other Ambulatory Visit (HOSPITAL_COMMUNITY): Payer: Self-pay

## 2024-05-20 ENCOUNTER — Other Ambulatory Visit (HOSPITAL_COMMUNITY): Payer: Self-pay

## 2024-05-21 ENCOUNTER — Telehealth: Payer: Self-pay | Admitting: Pharmacy Technician

## 2024-05-21 ENCOUNTER — Other Ambulatory Visit (HOSPITAL_COMMUNITY): Payer: Self-pay

## 2024-05-21 NOTE — Telephone Encounter (Signed)
 Pharmacy Patient Advocate Encounter   Received notification from [employer] that prior authorization for Trulicity  4.5MG /0.5ML auto-injectors is required/requested.   Insurance verification completed.   The patient is insured through [employer].   Per test claim: PA required; PA submitted to above mentioned insurance via Latent Key/confirmation #/EOC [id number] Status is pending

## 2024-05-21 NOTE — Telephone Encounter (Signed)
 Pharmacy Patient Advocate Encounter  Received notification from [employer] that Prior Authorization for Trulicity  4.5MG /0.5ML auto-injectors has been APPROVED from 05/21/2024 to 05/21/2025. Ran test claim, Copay is $25.00. This test claim was processed through [facility]- copay amounts may vary at other pharmacies due to pharmacy/plan contracts, or as the patient moves through the different stages of their insurance plan.   PA #/Case ID/Reference #: [id number]

## 2024-06-24 ENCOUNTER — Ambulatory Visit (INDEPENDENT_AMBULATORY_CARE_PROVIDER_SITE_OTHER): Admitting: Adult Health

## 2024-08-28 ENCOUNTER — Ambulatory Visit: Admitting: Family Medicine
